# Patient Record
Sex: Male | Born: 1980 | Race: Black or African American | Hispanic: No | Marital: Single | Smoking: Never smoker
Health system: Southern US, Community
[De-identification: ages and names within clinical notes are randomized; demographics above are authoritative.]

## PROBLEM LIST (undated history)

## (undated) ENCOUNTER — Ambulatory Visit (HOSPITAL_COMMUNITY): Admission: EM | Payer: Self-pay

## (undated) DIAGNOSIS — D649 Anemia, unspecified: Secondary | ICD-10-CM

## (undated) DIAGNOSIS — F1090 Alcohol use, unspecified, uncomplicated: Secondary | ICD-10-CM

## (undated) DIAGNOSIS — K863 Pseudocyst of pancreas: Secondary | ICD-10-CM

## (undated) DIAGNOSIS — K859 Acute pancreatitis without necrosis or infection, unspecified: Secondary | ICD-10-CM

## (undated) DIAGNOSIS — A549 Gonococcal infection, unspecified: Secondary | ICD-10-CM

## (undated) DIAGNOSIS — K7010 Alcoholic hepatitis without ascites: Secondary | ICD-10-CM

## (undated) DIAGNOSIS — I10 Essential (primary) hypertension: Secondary | ICD-10-CM

## (undated) HISTORY — DX: Pseudocyst of pancreas: K86.3

## (undated) HISTORY — DX: Gonococcal infection, unspecified: A54.9

## (undated) HISTORY — DX: Alcohol use, unspecified, uncomplicated: F10.90

## (undated) HISTORY — DX: Acute pancreatitis without necrosis or infection, unspecified: K85.90

## (undated) HISTORY — DX: Essential (primary) hypertension: I10

## (undated) HISTORY — DX: Anemia, unspecified: D64.9

## (undated) HISTORY — DX: Alcoholic hepatitis without ascites: K70.10

---

## 2005-08-12 ENCOUNTER — Emergency Department (HOSPITAL_COMMUNITY): Admission: EM | Admit: 2005-08-12 | Discharge: 2005-08-12 | Payer: Self-pay | Admitting: Emergency Medicine

## 2007-11-20 ENCOUNTER — Emergency Department (HOSPITAL_COMMUNITY): Admission: EM | Admit: 2007-11-20 | Discharge: 2007-11-20 | Payer: Self-pay | Admitting: Family Medicine

## 2015-12-13 ENCOUNTER — Ambulatory Visit (HOSPITAL_COMMUNITY)
Admission: EM | Admit: 2015-12-13 | Discharge: 2015-12-13 | Disposition: A | Discharge status: Home / Self Care | Payer: Self-pay | Attending: Family Medicine | Admitting: Family Medicine

## 2015-12-13 DIAGNOSIS — R369 Urethral discharge, unspecified: Secondary | ICD-10-CM

## 2015-12-13 DIAGNOSIS — A549 Gonococcal infection, unspecified: Secondary | ICD-10-CM

## 2015-12-13 MED ORDER — AZITHROMYCIN 250 MG PO TABS
1000.0000 mg | ORAL_TABLET | Freq: Once | ORAL | Status: AC
  Administered 2015-12-13: 1000 mg via ORAL

## 2015-12-13 MED ORDER — AZITHROMYCIN 250 MG PO TABS
ORAL_TABLET | ORAL | Status: AC
  Filled 2015-12-13: qty 4

## 2015-12-13 MED ORDER — CEFTRIAXONE SODIUM 1 G IJ SOLR
INTRAMUSCULAR | Status: AC
  Filled 2015-12-13: qty 10

## 2015-12-13 MED ORDER — CEFTRIAXONE SODIUM 1 G IJ SOLR
1.0000 g | Freq: Once | INTRAMUSCULAR | Status: AC
  Administered 2015-12-13: 1 g via INTRAMUSCULAR

## 2015-12-13 NOTE — ED Triage Notes (Signed)
Pt here for treatment for STD 

## 2015-12-13 NOTE — ED Provider Notes (Signed)
[facility]    CSN: [id number] Arrival date & time: 12/13/15  1741     History   Chief Complaint No chief complaint on file.   HPI Alexander [name] is a 35 y.o. male.   The history is provided by the patient.  Male GU Problem  Presenting symptoms: penile discharge   Presenting symptoms: no penile pain   Context comment:  Told by [facility] of pos test for oral GC but no rx avail for approx 10d., here for rx. Relieved by:  None tried Worsened by:  Nothing Ineffective treatments:  None tried Associated symptoms: no fever, no penile swelling and no scrotal swelling   Risk factors: multiple sexual partners, recent sexual activity and unprotected sex     No past medical history on file.  There are no active problems to display for this patient.   No past surgical history on file.     Home Medications    Prior to Admission medications   Not on File    Family History No family history on file.  Social History Social History  Substance Use Topics  . Smoking status: Not on file  . Smokeless tobacco: Not on file  . Alcohol use Not on file     Allergies   Patient has no allergy information on record.   Review of Systems Review of Systems  Constitutional: Negative for fever.  HENT: Positive for sore throat.   Genitourinary: Positive for discharge. Negative for penile pain, penile swelling, scrotal swelling and testicular pain.  Hematological: Positive for adenopathy.  All other systems reviewed and are negative.    Physical Exam Triage Vital Signs ED Triage Vitals  Enc Vitals Group     BP      Pulse      Resp      Temp      Temp src      SpO2      Weight      Height      Head Circumference      Peak Flow      Pain Score      Pain Loc      Pain Edu?      Excl. in GC?    No data found.   Updated Vital Signs There were no vitals taken for this visit.  Visual Acuity Right Eye Distance:   Left Eye Distance:   Bilateral Distance:     Right Eye Near:   Left Eye Near:    Bilateral Near:     Physical Exam  Constitutional: He is oriented to person, place, and time. He appears well-developed and well-nourished. No distress.  HENT:  Mouth/Throat: Oropharynx is clear and moist. No oropharyngeal exudate.  Neck: Normal range of motion. Neck supple.  Lymphadenopathy:    He has cervical adenopathy.  Neurological: He is alert and oriented to person, place, and time.  Nursing note and vitals reviewed.    UC Treatments / Results  Labs (all labs ordered are listed, but only abnormal results are displayed) Labs Reviewed - No data to display  EKG  EKG Interpretation None       Radiology No results found.  Procedures Procedures (including critical care time)  Medications Ordered in UC Medications  cefTRIAXone (ROCEPHIN) injection 1 g (not administered)  azithromycin (ZITHROMAX) tablet 1,000 mg (not administered)     Initial Impression / Assessment and Plan / UC Course  I have reviewed the triage vital signs and the nursing  notes.  Pertinent labs & imaging results that were available during my care of the patient were reviewed by me and considered in my medical decision making (see chart for details).  Clinical Course       Final Clinical Impressions(s) / UC Diagnoses   Final diagnoses:  None    New Prescriptions New Prescriptions   No medications on file     Linna HoffJames D Kindl, MD 12/13/15 [phone]

## 2015-12-13 NOTE — Discharge Instructions (Signed)
Follow up at health dept if further issues.

## 2017-11-09 ENCOUNTER — Encounter (HOSPITAL_COMMUNITY): Payer: Self-pay | Admitting: Emergency Medicine

## 2017-11-09 ENCOUNTER — Ambulatory Visit (HOSPITAL_COMMUNITY)
Admission: EM | Admit: 2017-11-09 | Discharge: 2017-11-09 | Disposition: A | Discharge status: Home / Self Care | Payer: Self-pay | Attending: Family Medicine | Admitting: Family Medicine

## 2017-11-09 DIAGNOSIS — Z202 Contact with and (suspected) exposure to infections with a predominantly sexual mode of transmission: Secondary | ICD-10-CM | POA: Insufficient documentation

## 2017-11-09 DIAGNOSIS — R369 Urethral discharge, unspecified: Secondary | ICD-10-CM

## 2017-11-09 MED ORDER — CEFTRIAXONE SODIUM 250 MG IJ SOLR
INTRAMUSCULAR | Status: AC
  Filled 2017-11-09: qty 250

## 2017-11-09 MED ORDER — CEFTRIAXONE SODIUM 250 MG IJ SOLR
250.0000 mg | Freq: Once | INTRAMUSCULAR | Status: AC
  Administered 2017-11-09: 250 mg via INTRAMUSCULAR

## 2017-11-09 MED ORDER — AZITHROMYCIN 250 MG PO TABS
1000.0000 mg | ORAL_TABLET | Freq: Once | ORAL | Status: AC
  Administered 2017-11-09: 1000 mg via ORAL

## 2017-11-09 MED ORDER — AZITHROMYCIN 250 MG PO TABS
ORAL_TABLET | ORAL | Status: AC
  Filled 2017-11-09: qty 4

## 2017-11-09 NOTE — ED Triage Notes (Signed)
Pt sts thinks he has gonorrhea

## 2017-11-09 NOTE — ED Provider Notes (Signed)
[facility]    CSN: [id number] Arrival date & time: 11/09/17  1420     History   Chief Complaint Chief Complaint  Patient presents with  . Exposure to STD    HPI LEORY ALLINSON is a 37 y.o. male.   37 year old male comes in for 1 to 2-day history of penile discharge.  States slight irritation to the tip of the penis.  Denies urinary symptoms such as frequency, dysuria, hematuria.  Denies fever, chills, night sweats.  Denies abdominal pain, nausea, vomiting.  Denies testicular swelling, testicular pain, penile lesion/ulcer.  He is sexually active active with multiple male partners, consistent condom use.  He is on Prep, and gets regular HIV/RPR testing.      History reviewed. No pertinent past medical history.  There are no active problems to display for this patient.   History reviewed. No pertinent surgical history.     Home Medications    Prior to Admission medications   Not on File    Family History History reviewed. No pertinent family history.  Social History Social History   Tobacco Use  . Smoking status: Never Smoker  . Smokeless tobacco: Never Used  Substance Use Topics  . Alcohol use: Not on file  . Drug use: Not on file     Allergies   Patient has no known allergies.   Review of Systems Review of Systems  Reason unable to perform ROS: See HPI as above.     Physical Exam Triage Vital Signs ED Triage Vitals [11/09/17 1452]  Enc Vitals Group     BP 137/84     Pulse Rate 83     Resp 18     Temp 98.1 F (36.7 C)     Temp Source Oral     SpO2 100 %     Weight      Height      Head Circumference      Peak Flow      Pain Score 0     Pain Loc      Pain Edu?      Excl. in GC?    No data found.  Updated Vital Signs BP 137/84 (BP Location: Left Arm)   Pulse 83   Temp 98.1 F (36.7 C) (Oral)   Resp 18   SpO2 100%   Physical Exam  Constitutional: He is oriented to person, place, and time. He appears well-developed  and well-nourished. No distress.  HENT:  Head: Normocephalic and atraumatic.  Eyes: Pupils are equal, round, and reactive to light. Conjunctivae are normal.  Neurological: He is alert and oriented to person, place, and time.  Skin: He is not diaphoretic.   UC Treatments / Results  Labs (all labs ordered are listed, but only abnormal results are displayed) Labs Reviewed  URINE CYTOLOGY ANCILLARY ONLY    EKG None  Radiology No results found.  Procedures Procedures (including critical care time)  Medications Ordered in UC Medications  azithromycin (ZITHROMAX) tablet 1,000 mg (1,000 mg Oral Given 11/09/17 1529)  cefTRIAXone (ROCEPHIN) injection 250 mg (250 mg Intramuscular Given 11/09/17 1529)    Initial Impression / Assessment and Plan / UC Course  I have reviewed the triage vital signs and the nursing notes.  Pertinent labs & imaging results that were available during my care of the patient were reviewed by me and considered in my medical decision making (see chart for details).    Patient was treated empirically for GC. Azithromycin and  Rocephin given in office today. Cytology sent, patient will be contacted with any positive results that require additional treatment. Patient to refrain from sexual activity for the next 7 days. Return precautions given.   Final Clinical Impressions(s) / UC Diagnoses   Final diagnoses:  Penile discharge    ED Prescriptions    None        Belinda Fisher, PA-C 11/09/17 1534

## 2017-11-09 NOTE — Discharge Instructions (Signed)
You were treated empirically for gonorrhea, chlamydia. Azithromycin 1g by mouth and Rocephin 250mg  injection given in office today. Cytology sent, you will be contacted with any positive results that requires further treatment. Refrain from sexual activity and alcohol use for the next 7 days. Monitor for any worsening of symptoms, fever, testicular swelling/pain, penile lesion/ulcer.

## 2017-11-10 LAB — URINE CYTOLOGY ANCILLARY ONLY
CHLAMYDIA, DNA PROBE: NEGATIVE
Neisseria Gonorrhea: POSITIVE — AB
Trichomonas: NEGATIVE

## 2017-11-12 ENCOUNTER — Telehealth (HOSPITAL_COMMUNITY): Payer: Self-pay

## 2017-11-12 NOTE — Telephone Encounter (Signed)
Test for gonorrhea was positive. This was treated at the urgent care visit with IM rocephin 250mg and po zithromax 1g. Pt called regarding test results, instructed patient to refrain from sexual intercourse for 7 days after treatment to give the medicine time to work. Sexual partners need to be notified and tested/treated. Condoms may reduce risk of reinfection. Recheck or followup with PCP for further evaluation if symptoms are not improving. Answered all patient questions. [facility] notified.  

## 2019-10-07 ENCOUNTER — Other Ambulatory Visit: Payer: Self-pay

## 2019-10-07 ENCOUNTER — Ambulatory Visit (HOSPITAL_COMMUNITY)
Admission: EM | Admit: 2019-10-07 | Discharge: 2019-10-07 | Disposition: A | Discharge status: Home / Self Care | Payer: Self-pay | Attending: Nurse Practitioner | Admitting: Nurse Practitioner

## 2019-10-07 ENCOUNTER — Encounter (HOSPITAL_COMMUNITY): Payer: Self-pay

## 2019-10-07 DIAGNOSIS — Z202 Contact with and (suspected) exposure to infections with a predominantly sexual mode of transmission: Secondary | ICD-10-CM | POA: Insufficient documentation

## 2019-10-07 DIAGNOSIS — R369 Urethral discharge, unspecified: Secondary | ICD-10-CM | POA: Insufficient documentation

## 2019-10-07 MED ORDER — CEFTRIAXONE SODIUM 500 MG IJ SOLR
500.0000 mg | Freq: Once | INTRAMUSCULAR | Status: AC
  Administered 2019-10-07: 500 mg via INTRAMUSCULAR

## 2019-10-07 MED ORDER — CEFTRIAXONE SODIUM 500 MG IJ SOLR
INTRAMUSCULAR | Status: AC
  Filled 2019-10-07: qty 500

## 2019-10-07 MED ORDER — DOXYCYCLINE HYCLATE 100 MG PO CAPS: 100.0000 mg | ORAL_CAPSULE | Freq: Two times a day (BID) | ORAL | 0 refills | Status: AC

## 2019-10-07 MED ORDER — LIDOCAINE HCL (PF) 1 % IJ SOLN
INTRAMUSCULAR | Status: AC
  Filled 2019-10-07: qty 2

## 2019-10-07 NOTE — ED Provider Notes (Signed)
[facility]    CSN: [id number] Arrival date & time: 10/07/19  1819      History   Chief Complaint Chief Complaint  Patient presents with   SEXUALLY TRANSMITTED DISEASE    HPI Alexander [name] is a 39 y.o. male.   Subjective:  Alexander [name] is a 39 y.o. male who complains of penile discharge for 2 days. Patient denies back pain, fever, dysuria or rectal pain/sores.  Patient has history of gonorrhea in the past and feels that this is the same. He practices homosexual sexual activity through the penis and rectum. He denies any anal sores or discomfort. His last HIV test was over 6 months ago.   The following portions of the patient's history were reviewed and updated as appropriate: allergies, current medications, past family history, past medical history, past social history, past surgical history and problem list.       History reviewed. No pertinent past medical history.  There are no problems to display for this patient.   History reviewed. No pertinent surgical history.     Home Medications    Prior to Admission medications   Medication Sig Start Date End Date Taking? Authorizing Provider  doxycycline (VIBRAMYCIN) 100 MG capsule Take 1 capsule (100 mg total) by mouth 2 (two) times daily for 7 days. 10/07/19 10/14/19  Lurline Idol, FNP    Family History Family History  Problem Relation Age of Onset   Healthy Mother    Hypertension Father     Social History Social History   Tobacco Use   Smoking status: Never Smoker   Smokeless tobacco: Never Used  Substance Use Topics   Alcohol use: Not on file   Drug use: Not on file     Allergies   Patient has no known allergies.   Review of Systems Review of Systems  Constitutional: Negative.   Gastrointestinal: Negative for anal bleeding.  Genitourinary: Positive for discharge. Negative for dysuria, genital sores and penile pain.  All other systems reviewed and are  negative.    Physical Exam Triage Vital Signs ED Triage Vitals  Enc Vitals Group     BP 10/07/19 2018 (!) 142/100     Pulse Rate 10/07/19 2018 89     Resp 10/07/19 2018 19     Temp 10/07/19 2018 98.2 F (36.8 C)     Temp src --      SpO2 10/07/19 2018 99 %     Weight --      Height --      Head Circumference --      Peak Flow --      Pain Score 10/07/19 2017 0     Pain Loc --      Pain Edu? --      Excl. in GC? --    No data found.  Updated Vital Signs BP (!) 142/100    Pulse 89    Temp 98.2 F (36.8 C)    Resp 19    SpO2 99%   Visual Acuity Right Eye Distance:   Left Eye Distance:   Bilateral Distance:    Right Eye Near:   Left Eye Near:    Bilateral Near:     Physical Exam Vitals reviewed. Exam conducted with a chaperone present.  Constitutional:      Appearance: Normal appearance.  HENT:     Head: Normocephalic.  Genitourinary:    Penis: Normal and circumcised. No tenderness, discharge or swelling.  Testes: Normal.     Rectum: Normal.  Musculoskeletal:        General: Normal range of motion.     Cervical back: Normal range of motion and neck supple.  Skin:    General: Skin is warm and dry.  Neurological:     General: No focal deficit present.     Mental Status: He is alert and oriented to person, place, and time.      UC Treatments / Results  Labs (all labs ordered are listed, but only abnormal results are displayed) Labs Reviewed  HIV ANTIBODY (ROUTINE TESTING W REFLEX)  CYTOLOGY, (ORAL, ANAL, URETHRAL) ANCILLARY ONLY    EKG   Radiology No results found.  Procedures Procedures (including critical care time)  Medications Ordered in UC Medications  cefTRIAXone (ROCEPHIN) injection 500 mg (has no administration in time range)    Initial Impression / Assessment and Plan / UC Course  I have reviewed the triage vital signs and the nursing notes.  Pertinent labs & imaging results that were available during my care of the patient  were reviewed by me and considered in my medical decision making (see chart for details).    39 yo male presenting for STD treatment for possible gonorrhea. Penile swabs pending for GC/lamydia as well as HIV screen. Rocephin given in clinic. Rx for Doxy provided. Safe sex practices highly encouraged.   Today's evaluation has revealed no signs of a dangerous process. Discussed diagnosis with patient and/or guardian. Patient and/or guardian aware of their diagnosis, possible red flag symptoms to watch out for and need for close follow up. Patient and/or guardian understands verbal and written discharge instructions. Patient and/or guardian comfortable with plan and disposition.  Patient and/or guardian has a clear mental status at this time, good insight into illness (after discussion and teaching) and has clear judgment to make decisions regarding their care  This care was provided during an unprecedented National Emergency due to the Novel Coronavirus (COVID-19) pandemic. COVID-19 infections and transmission risks place heavy strains on healthcare resources.  As this pandemic evolves, our facility, providers, and staff strive to respond fluidly, to remain operational, and to provide care relative to available resources and information. Outcomes are unpredictable and treatments are without well-defined guidelines. Further, the impact of COVID-19 on all aspects of urgent care, including the impact to patients seeking care for reasons other than COVID-19, is unavoidable during this national emergency. At this time of the global pandemic, management of patients has significantly changed, even for non-COVID positive patients given high local and regional COVID volumes at this time requiring high healthcare system and resource utilization. The standard of care for management of both COVID suspected and non-COVID suspected patients continues to change rapidly at the local, regional, national, and global levels. This  patient was worked up and treated to the best available but ever changing evidence and resources available at this current time.   Documentation was completed with the aid of voice recognition software. Transcription may contain typographical errors.   Final Clinical Impressions(s) / UC Diagnoses   Final diagnoses:  STD exposure  Penile discharge   Discharge Instructions   None    ED Prescriptions    Medication Sig Dispense Auth. Provider   doxycycline (VIBRAMYCIN) 100 MG capsule Take 1 capsule (100 mg total) by mouth 2 (two) times daily for 7 days. 14 capsule Lurline Idol, FNP     PDMP not reviewed this encounter.   Lurline Idol, [place] 10/07/19 2140

## 2019-10-07 NOTE — ED Triage Notes (Signed)
Pt presents with concern for gonorrhea. Reports discharge x 2 days. Possible exposure.

## 2019-10-08 LAB — CYTOLOGY, (ORAL, ANAL, URETHRAL) ANCILLARY ONLY
Chlamydia: NEGATIVE
Comment: NEGATIVE
Comment: NORMAL
Neisseria Gonorrhea: NEGATIVE

## 2019-10-08 LAB — HIV ANTIBODY (ROUTINE TESTING W REFLEX): HIV Screen 4th Generation wRfx: NONREACTIVE

## 2021-02-28 ENCOUNTER — Emergency Department (HOSPITAL_COMMUNITY): Payer: Self-pay

## 2021-02-28 ENCOUNTER — Emergency Department (HOSPITAL_COMMUNITY)
Admission: EM | Admit: 2021-02-28 | Discharge: 2021-02-28 | Disposition: A | Discharge status: Home / Self Care | Payer: Self-pay | Attending: Emergency Medicine | Admitting: Emergency Medicine

## 2021-02-28 ENCOUNTER — Other Ambulatory Visit: Payer: Self-pay

## 2021-02-28 DIAGNOSIS — R519 Headache, unspecified: Secondary | ICD-10-CM

## 2021-02-28 DIAGNOSIS — I10 Essential (primary) hypertension: Secondary | ICD-10-CM | POA: Insufficient documentation

## 2021-02-28 DIAGNOSIS — Z79899 Other long term (current) drug therapy: Secondary | ICD-10-CM | POA: Insufficient documentation

## 2021-02-28 LAB — TROPONIN I (HIGH SENSITIVITY)
Troponin I (High Sensitivity): 3 ng/L (ref <18)
Troponin I (High Sensitivity): 5 ng/L (ref <18)

## 2021-02-28 LAB — CBC
HCT: 39.5 % (ref 39.0–52.0)
Hemoglobin: 14 g/dL (ref 13.0–17.0)
MCH: 34 pg (ref 26.0–34.0)
MCHC: 35.4 g/dL (ref 30.0–36.0)
MCV: 95.9 fL (ref 80.0–100.0)
Platelets: 242 10*3/uL (ref 150–400)
RBC: 4.12 MIL/uL — ABNORMAL LOW (ref 4.22–5.81)
RDW: 11.9 % (ref 11.5–15.5)
WBC: 5.7 10*3/uL (ref 4.0–10.5)
nRBC: 0 % (ref 0.0–0.2)

## 2021-02-28 LAB — BASIC METABOLIC PANEL
Anion gap: 18 — ABNORMAL HIGH (ref 5–15)
BUN: 5 mg/dL — ABNORMAL LOW (ref 6–20)
CO2: 23 mmol/L (ref 22–32)
Calcium: 9.4 mg/dL (ref 8.9–10.3)
Chloride: 94 mmol/L — ABNORMAL LOW (ref 98–111)
Creatinine, Ser: 0.85 mg/dL (ref 0.61–1.24)
GFR, Estimated: >60 mL/min (ref >60)
Glucose, Bld: 77 mg/dL (ref 70–99)
Potassium: 3.2 mmol/L — ABNORMAL LOW (ref 3.5–5.1)
Sodium: 135 mmol/L (ref 135–145)

## 2021-02-28 MED ORDER — HYDROXYZINE HCL 25 MG PO TABS: 25.0000 mg | ORAL_TABLET | Freq: Three times a day (TID) | ORAL | 0 refills | Status: DC | PRN

## 2021-02-28 MED ORDER — PROCHLORPERAZINE EDISYLATE 10 MG/2ML IJ SOLN
5.0000 mg | Freq: Once | INTRAMUSCULAR | Status: AC
  Administered 2021-02-28: 5 mg via INTRAMUSCULAR
  Filled 2021-02-28: qty 2

## 2021-02-28 MED ORDER — KETOROLAC TROMETHAMINE 30 MG/ML IJ SOLN
30.0000 mg | Freq: Once | INTRAMUSCULAR | Status: AC
  Administered 2021-02-28: 30 mg via INTRAMUSCULAR
  Filled 2021-02-28: qty 1

## 2021-02-28 MED ORDER — AMLODIPINE BESYLATE 5 MG PO TABS: 5.0000 mg | ORAL_TABLET | Freq: Every day | ORAL | 0 refills | Status: DC

## 2021-02-28 NOTE — ED Provider Notes (Signed)
[facility] EMERGENCY DEPARTMENT Provider Note   CSN: [id number] Arrival date & time: 02/28/21  1627     History  Chief Complaint  Patient presents with   Chest Pain   Headache   Hypertension    SOMNANG MAHAN is a 41 y.o. male.   Chest Pain Associated symptoms: headache   Associated symptoms: no abdominal pain, no back pain and no fatigue   Headache Associated symptoms: no abdominal pain, no back pain, no congestion and no fatigue   Hypertension Associated symptoms include chest pain and headaches. Pertinent negatives include no abdominal pain.  Patient presents with chest pain headache and hypertension.  Began yesterday.  States went to be seen for medicine check and found to have hypertension.  Reportedly 170/110.  States that 3 months ago when he was last checked his blood was elevated but not as much.  States he has a headache that feels as if his head is getting split apart.  No nausea or vomiting.  Does have slight chest tightness.  No swelling his legs.  Does drink heavily but does not use other drugs.  No history of hypertension.  No swelling in his legs.  No stimulants.     Home Medications Prior to Admission medications   Medication Sig Start Date End Date Taking? Authorizing Provider  acetaminophen (TYLENOL) 500 MG tablet Take 500 mg by mouth every 6 (six) hours as needed for moderate pain or headache.   Yes [provider]  amLODipine (NORVASC) 5 MG tablet Take 1 tablet (5 mg total) by mouth daily. 02/28/21  Yes Benjiman Core, MD  DESCOVY 200-25 MG tablet Take 1 tablet by mouth daily. 02/22/21  Yes [provider]  hydrOXYzine (ATARAX) 25 MG tablet Take 1 tablet (25 mg total) by mouth every 8 (eight) hours as needed for anxiety. 02/28/21  Yes Benjiman Core, MD  ibuprofen (ADVIL) 200 MG tablet Take 400 mg by mouth every 6 (six) hours as needed for moderate pain or headache.   Yes [provider]      Allergies     Patient has no known allergies.    Review of Systems   Review of Systems  Constitutional:  Negative for fatigue.  HENT:  Negative for congestion.   Cardiovascular:  Positive for chest pain.  Gastrointestinal:  Negative for abdominal pain.  Genitourinary:  Negative for flank pain.  Musculoskeletal:  Negative for back pain.  Neurological:  Positive for headaches.   Physical Exam Updated Vital Signs BP (!) 144/96    Pulse 71    Temp 98.6 F (37 C) (Oral)    Resp 20    Ht 5\' 5"  (1.651 m)    Wt 63.5 kg    SpO2 98%    BMI 23.30 kg/m  Physical Exam Vitals and nursing note reviewed.  Cardiovascular:     Rate and Rhythm: Normal rate and regular rhythm.  Pulmonary:     Breath sounds: No decreased breath sounds or wheezing.  Chest:     Chest wall: No tenderness.  Abdominal:     Tenderness: There is no abdominal tenderness.  Musculoskeletal:     Cervical back: Neck supple.     Right lower leg: No edema.     Left lower leg: No edema.  Skin:    Capillary Refill: Capillary refill takes less than 2 seconds.  Neurological:     Mental Status: He is alert and oriented to person, place, and time.    ED  Results / Procedures / Treatments   Labs (all labs ordered are listed, but only abnormal results are displayed) Labs Reviewed  BASIC METABOLIC PANEL - Abnormal; Notable for the following components:      Result Value   Potassium 3.2 (*)    Chloride 94 (*)    BUN 5 (*)    Anion gap 18 (*)    All other components within normal limits  CBC - Abnormal; Notable for the following components:   RBC 4.12 (*)    All other components within normal limits  TROPONIN I (HIGH SENSITIVITY)  TROPONIN I (HIGH SENSITIVITY)    EKG None  Radiology DG Chest 2 View  Result Date: 02/28/2021 CLINICAL DATA:  Headache with chest pain. EXAM: CHEST - 2 VIEW COMPARISON:  None. FINDINGS: The heart size and mediastinal contours are within normal limits. Both lungs are clear. The visualized skeletal  structures are unremarkable. IMPRESSION: No active cardiopulmonary disease. Electronically Signed   By: Darliss CheneyAmy  Guttmann M.D.   On: 02/28/2021 19:16   CT HEAD WO CONTRAST (5MM)  Result Date: 02/28/2021 CLINICAL DATA:  Headache EXAM: CT HEAD WITHOUT CONTRAST TECHNIQUE: Contiguous axial images were obtained from the base of the skull through the vertex without intravenous contrast. RADIATION DOSE REDUCTION: This exam was performed according to the departmental dose-optimization program which includes automated exposure control, adjustment of the mA and/or kV according to patient size and/or use of iterative reconstruction technique. COMPARISON:  None. FINDINGS: Brain: No acute intracranial hemorrhage, mass effect, or herniation. No extra-axial fluid collections. No evidence of acute territorial infarct. No hydrocephalus. Vascular: No hyperdense vessel or unexpected calcification. Skull: Normal. Negative for fracture or focal lesion. Sinuses/Orbits: No acute finding. Other: None. IMPRESSION: No acute intracranial process identified. Electronically Signed   By: Jannifer Hickelaney  Williams M.D.   On: 02/28/2021 20:13    Procedures Procedures    Medications Ordered in ED Medications  ketorolac (TORADOL) 30 MG/ML injection 30 mg (30 mg Intramuscular Given 02/28/21 2311)  prochlorperazine (COMPAZINE) injection 5 mg (5 mg Intramuscular Given 02/28/21 2309)    ED Course/ Medical Decision Making/ A&P                           Medical Decision Making Amount and/or Complexity of Data Reviewed Labs: ordered. Radiology: ordered and independent interpretation performed. Decision-making details documented in ED Course. ECG/medicine tests: independent interpretation performed. Decision-making details documented in ED Course.  Risk Prescription drug management.  Initial differentialwith hypertension and high blood pressure includes intracranial hemorrhage, hypertensive headache, hypertensive urgency. Patient presents with  headache hypertension and chest pain.  Has had for last couple days.  States blood pressure was elevated checked 3 months ago 2.  No history of hypertension.  Is on preexposure prophylaxis for HIV.  Head CT done and reassuring.  Independently interpreted by me.  EKG also reassuring without ischemia.  Blood work does not show renal failure and troponin not elevated.  Will start patient on amlodipine.  Will have follow-up with a PCP.  Since had been elevated likely a few months ago is likely an ongoing hypertension.  Also states he is having some anxiety.  We will add a little Vistaril.  Does drink heavily and instructed this is probably not helping with anxiety.  Will discharge home.        Final Clinical Impression(s) / ED Diagnoses Final diagnoses:  Hypertension, unspecified type  Nonintractable headache, unspecified chronicity pattern, unspecified headache type  Rx / DC Orders ED Discharge Orders          Ordered    amLODipine (NORVASC) 5 MG tablet  Daily        02/28/21 2309    hydrOXYzine (ATARAX) 25 MG tablet  Every 8 hours PRN        02/28/21 2317              Benjiman Core, MD 02/28/21 2349

## 2021-02-28 NOTE — ED Triage Notes (Signed)
Pt here POV with c/o of headache, chest pain and hypertension. Pt seen at health clinic 02/27/2021. Pt reports BP 170's. Since yesterday, pt reports splitting headache of 7/10. Chest tightness 4/10. Pt feels VS are better in triage and may leave. RN encouraged pt to stay.

## 2021-02-28 NOTE — ED Provider Triage Note (Signed)
Emergency Medicine Provider Triage Evaluation Note  Alexander [name] , a 41 y.o. male  was evaluated in triage.  Pt complains of severe, left-sided headache starting yesterday.  Patient's blood pressures were noted to be high, into the 170s.  Patient does not have a previous history of high blood pressure and is not under treatment for this.  He reports some blurry vision in his left eye.  Also some chest tightness that is persisting.  No shortness of breath.  No lower extremity swelling.  No neck pain.  No weakness, numbness, or tingling in extremities.  He has not had a similar headache in the past.  Review of Systems  Positive: Headache, chest tightness Negative: Vomiting  Physical Exam  BP (!) 159/106 (BP Location: Right Arm)    Pulse 75    Temp 98.6 F (37 C) (Oral)    Resp 20    Ht 5\' 5"  (1.651 m)    Wt 63.5 kg    SpO2 100%    BMI 23.30 kg/m  Gen:   Awake, no distress   Resp:  Normal effort  MSK:   Moves extremities without difficulty  Other:  Heart regular rate and rhythm, lungs clear to auscultation, gross neuro intact  Medical Decision Making  Medically screening exam initiated at 6:18 PM.  Appropriate orders placed.  Alexander [name] was informed that the remainder of the evaluation will be completed by another provider, this initial triage assessment does not replace that evaluation, and the importance of remaining in the ED until their evaluation is complete.     Alexander Gaudier, PA-C 02/28/21 1820

## 2021-02-28 NOTE — Discharge Instructions (Signed)
Follow-up with a primary care doctor for further management of the blood pressure.

## 2022-02-07 ENCOUNTER — Ambulatory Visit (HOSPITAL_COMMUNITY)
Admission: EM | Admit: 2022-02-07 | Discharge: 2022-02-07 | Disposition: A | Discharge status: Home / Self Care | Payer: Self-pay | Attending: Emergency Medicine | Admitting: Emergency Medicine

## 2022-02-07 ENCOUNTER — Encounter (HOSPITAL_COMMUNITY): Payer: Self-pay

## 2022-02-07 DIAGNOSIS — M545 Low back pain, unspecified: Secondary | ICD-10-CM

## 2022-02-07 MED ORDER — IBUPROFEN 600 MG PO TABS: 600.0000 mg | ORAL_TABLET | Freq: Three times a day (TID) | ORAL | 0 refills | Status: DC

## 2022-02-07 MED ORDER — DEXAMETHASONE SODIUM PHOSPHATE 10 MG/ML IJ SOLN
10.0000 mg | Freq: Once | INTRAMUSCULAR | Status: AC
  Administered 2022-02-07: 10 mg via INTRAMUSCULAR

## 2022-02-07 MED ORDER — CYCLOBENZAPRINE HCL 10 MG PO TABS: 10.0000 mg | ORAL_TABLET | Freq: Two times a day (BID) | ORAL | 0 refills | Status: DC | PRN

## 2022-02-07 MED ORDER — ACETAMINOPHEN 500 MG PO TABS: 500.0000 mg | ORAL_TABLET | Freq: Four times a day (QID) | ORAL | 0 refills | Status: AC | PRN

## 2022-02-07 MED ORDER — DEXAMETHASONE SODIUM PHOSPHATE 10 MG/ML IJ SOLN
INTRAMUSCULAR | Status: AC
  Filled 2022-02-07: qty 1

## 2022-02-07 MED ORDER — IBUPROFEN 600 MG PO TABS: 600.0000 mg | ORAL_TABLET | Freq: Two times a day (BID) | ORAL | 0 refills | Status: AC | PRN

## 2022-02-07 NOTE — Discharge Instructions (Addendum)
I recommend to take ibuprofen, 3 times daily for the next 5 days. You can alternate this with tylenol.  Additionally you can take the muscle relaxer twice daily.  If this makes you drowsy, take only at night.  You can apply topical lidocaine patch for 12 hours at time. I recommend the "[facility]" brand. They are less than 10 dollars at [place].  Please follow-up with your new primary care provider at your appointment next week regarding your blood pressure.

## 2022-02-07 NOTE — ED Triage Notes (Signed)
Pt reports lower back pain for several weeks. Pt reports it feels like knots are in his back.

## 2022-02-07 NOTE — ED Notes (Signed)
No answer from lobby  

## 2022-02-07 NOTE — ED Provider Notes (Signed)
[place]    CSN: [id number] Arrival date & time: 02/07/22  4259     History   Chief Complaint Chief Complaint  Patient presents with   Back Pain    HPI Alexander [name] is a 42 y.o. male. Presents with 6 day history of lower back pain Reports 10/10 today. Feels "knots" in the back Sometimes feels it radiate into the legs. No weakness. Denies numbness or tingling. No injury or trauma. Denies heavy lifting. No bowel or bladder dysfunction. No fevers. Reports history of stiff muscles  Has tried 200 mg ibuprofen. Also tried "naproxen and aleve" Last dose was today about 5 hours ago Also tried topical Voltaren   Takes descovy but unknown who prescribes this  BP elevated today. No official dx but has been elevated in the past. Has not followed up with PCP - does not have one  History reviewed. No pertinent past medical history.  There are no problems to display for this patient.  History reviewed. No pertinent surgical history.   Home Medications    Prior to Admission medications   Medication Sig Start Date End Date Taking? Authorizing Provider  acetaminophen (TYLENOL) 500 MG tablet Take 1 tablet (500 mg total) by mouth every 6 (six) hours as needed. 02/07/22  Yes Mical Kicklighter, Wells Guiles, PA-C  cyclobenzaprine (FLEXERIL) 10 MG tablet Take 1 tablet (10 mg total) by mouth 2 (two) times daily as needed for muscle spasms. 02/07/22  Yes Chalene Treu, Wells Guiles, PA-C  amLODipine (NORVASC) 5 MG tablet Take 1 tablet (5 mg total) by mouth daily. 02/28/21   Davonna Belling, MD  DESCOVY 200-25 MG tablet Take 1 tablet by mouth daily. 02/22/21   [provider]  hydrOXYzine (ATARAX) 25 MG tablet Take 1 tablet (25 mg total) by mouth every 8 (eight) hours as needed for anxiety. 02/28/21   Davonna Belling, MD  ibuprofen (ADVIL) 600 MG tablet Take 1 tablet (600 mg total) by mouth 2 (two) times daily as needed for up to 5 days. 02/07/22 02/12/22  Annica Marinello, Wells Guiles PA-C    Family  History Family History  Problem Relation Age of Onset   Healthy Mother    Hypertension Father     Social History Social History   Tobacco Use   Smoking status: Never   Smokeless tobacco: Never     Allergies   Patient has no known allergies.   Review of Systems Review of Systems  Musculoskeletal:  Positive for back pain.   As per HPI  Physical Exam Triage Vital Signs ED Triage Vitals [02/07/22 1750]  Enc Vitals Group     BP (!) 181/121     Pulse Rate (!) 103     Resp 16     Temp 98.7 F (37.1 C)     Temp Source Oral     SpO2 100 %     Weight      Height      Head Circumference      Peak Flow      Pain Score 10     Pain Loc      Pain Edu?      Excl. in [place]?    No data found.  Updated Vital Signs BP (!) 136/90 (BP Location: Left Arm)   Pulse 90   Temp 98.7 F (37.1 C) (Oral)   Resp 15   SpO2 98%    Physical Exam Vitals and nursing note reviewed.  Constitutional:      General: He is not in  acute distress. HENT:     Mouth/Throat:     Mouth: Mucous membranes are moist.     Pharynx: Oropharynx is clear.  Eyes:     Extraocular Movements: Extraocular movements intact.     Conjunctiva/sclera: Conjunctivae normal.     Pupils: Pupils are equal, round, and reactive to light.  Cardiovascular:     Rate and Rhythm: Normal rate and regular rhythm.     Heart sounds: Normal heart sounds.  Pulmonary:     Effort: Pulmonary effort is normal.     Breath sounds: Normal breath sounds.  Musculoskeletal:        General: Tenderness present. Normal range of motion.     Cervical back: Normal range of motion. No rigidity.     Comments: Bilat lumbar paraspinals tender, feel tight/spasm. No spinal or bony tenderness  Skin:    General: Skin is warm and dry.     Findings: No bruising or rash.  Neurological:     General: No focal deficit present.     Mental Status: He is alert and oriented to person, place, and time.     Cranial Nerves: Cranial nerves 2-12 are intact.      Sensory: Sensation is intact.     Motor: Motor function is intact. No weakness.     Coordination: Coordination is intact.     Gait: Gait is intact.     Deep Tendon Reflexes: Reflexes are normal and symmetric.     Comments: Strength 5/5 throughout. Sensation and ROM intact.      UC Treatments / Results  Labs (all labs ordered are listed, but only abnormal results are displayed) Labs Reviewed - No data to display  EKG   Radiology No results found.  Procedures Procedures (including critical care time)  Medications Ordered in UC Medications  dexamethasone (DECADRON) injection 10 mg (10 mg Intramuscular Given 02/07/22 1837)    Initial Impression / Assessment and Plan / UC Course  I have reviewed the triage vital signs and the nursing notes.  Pertinent labs & imaging results that were available during my care of the patient were reviewed by me and considered in my medical decision making (see chart for details).  Muscular pain. No red flags. No indication for xray imaging today.  Patient has taken naproxen today, cannot offer Toradol at this time. Will try IM decadron. On reassessment, he is improved.   Discussed that ibuprofen, Aleve, naproxen are all the same medication.  I recommend he pick one and stick with that. I have sent ibuprofen to use for 5 days, recommend to alternate with tylenol since he takes descovy.  Patient is very worried that this will affect his liver.  We discussed that these medicines do not have impact on the liver, and short course over the next 6 to 7 days will not be harmful to his kidneys. Especially if he alternates with tylenol. Recommend flexaril BID. Patient is concerned because he drinks alcohol daily. I recommend to not use together. Discussed side effects. Topical lidocaine patches OTC.  Discussed return precautions.  Set up with PCP via open scheduling, appointment is next week. He needs close follow up for blood pressure  management. BP recheck is improved. No chest pain, headache, shortness of breath, vision changes.   Final Clinical Impressions(s) / UC Diagnoses   Final diagnoses:  Acute bilateral low back pain, unspecified whether sciatica present     Discharge Instructions      I recommend to take ibuprofen, 3 times  daily for the next 5 days. You can alternate this with tylenol.  Additionally you can take the muscle relaxer twice daily.  If this makes you drowsy, take only at night.  You can apply topical lidocaine patch for 12 hours at time. I recommend the "[facility]" brand. They are less than 10 dollars at [place].  Please follow-up with your new primary care provider at your appointment next week regarding your blood pressure.     ED Prescriptions     Medication Sig Dispense Auth. Provider         cyclobenzaprine (FLEXERIL) 10 MG tablet Take 1 tablet (10 mg total) by mouth 2 (two) times daily as needed for muscle spasms. 20 tablet Faysal Fenoglio, PA-C         acetaminophen (TYLENOL) 500 MG tablet Take 1 tablet (500 mg total) by mouth every 6 (six) hours as needed. 30 tablet Lynnelle Mesmer, PA-C   ibuprofen (ADVIL) 600 MG tablet Take 1 tablet (600 mg total) by mouth 2 (two) times daily as needed for up to 5 days. 10 tablet Parisa Pinela, Wells Guiles, PA-C      PDMP not reviewed this encounter.   Kyra Leyland 02/07/22 1928

## 2022-02-14 ENCOUNTER — Ambulatory Visit: Payer: Self-pay | Admitting: Family

## 2022-06-26 ENCOUNTER — Ambulatory Visit (HOSPITAL_COMMUNITY)
Admission: EM | Admit: 2022-06-26 | Discharge: 2022-06-26 | Disposition: A | Discharge status: Home / Self Care | Payer: Self-pay | Attending: Emergency Medicine | Admitting: Emergency Medicine

## 2022-06-26 ENCOUNTER — Encounter (HOSPITAL_COMMUNITY): Payer: Self-pay | Admitting: Emergency Medicine

## 2022-06-26 DIAGNOSIS — M25562 Pain in left knee: Secondary | ICD-10-CM

## 2022-06-26 DIAGNOSIS — M25561 Pain in right knee: Secondary | ICD-10-CM

## 2022-06-26 MED ORDER — DEXAMETHASONE SODIUM PHOSPHATE 10 MG/ML IJ SOLN
10.0000 mg | Freq: Once | INTRAMUSCULAR | Status: AC
  Administered 2022-06-26: 10 mg via INTRAMUSCULAR

## 2022-06-26 MED ORDER — DEXAMETHASONE SODIUM PHOSPHATE 10 MG/ML IJ SOLN
INTRAMUSCULAR | Status: AC
  Filled 2022-06-26: qty 1

## 2022-06-26 NOTE — ED Provider Notes (Signed)
[facility]    CSN: [id number] Arrival date & time: 06/26/22  1809      History   Chief Complaint Chief Complaint  Patient presents with   Knee Pain    HPI Alexander [name] is a 42 y.o. male.   Patient presents to clinic for ongoing bilateral knee pain.  He was seen at the [facility] clinic last week for the same problem.  He is presenting today requesting joint injections.  He has tried oral steroids, partially, he quit these due to the side effects.  Pain has been ongoing for the past few months, denies any injuries, falls or trauma.  Patient ambulating in room, pacing.  The history is provided by the patient and medical records.  Knee Pain   History reviewed. No pertinent past medical history.  There are no problems to display for this patient.   History reviewed. No pertinent surgical history.     Home Medications    Prior to Admission medications   Medication Sig Start Date End Date Taking? Authorizing Provider  acetaminophen (TYLENOL) 500 MG tablet Take 1 tablet (500 mg total) by mouth every 6 (six) hours as needed. 02/07/22   Rising, Lurena Joiner, PA-C  amLODipine (NORVASC) 5 MG tablet Take 1 tablet (5 mg total) by mouth daily. 02/28/21   Benjiman Core, MD  cyclobenzaprine (FLEXERIL) 10 MG tablet Take 1 tablet (10 mg total) by mouth 2 (two) times daily as needed for muscle spasms. 02/07/22   Rising, Rebecca, PA-C  DESCOVY 200-25 MG tablet Take 1 tablet by mouth daily. 02/22/21   [provider]  hydrOXYzine (ATARAX) 25 MG tablet Take 1 tablet (25 mg total) by mouth every 8 (eight) hours as needed for anxiety. 02/28/21   Benjiman Core, MD    Family History Family History  Problem Relation Age of Onset   Healthy Mother    Hypertension Father     Social History Social History   Tobacco Use   Smoking status: Never   Smokeless tobacco: Never     Allergies   Patient has no known allergies.   Review of Systems Review of Systems   Musculoskeletal:  Negative for gait problem and joint swelling.     Physical Exam Triage Vital Signs ED Triage Vitals  Enc Vitals Group     BP 06/26/22 1850 (!) 147/100     Pulse Rate 06/26/22 1850 (!) 104     Resp 06/26/22 1850 14     Temp 06/26/22 1850 97.8 F (36.6 C)     Temp Source 06/26/22 1850 Oral     SpO2 06/26/22 1850 98 %     Weight --      Height --      Head Circumference --      Peak Flow --      Pain Score 06/26/22 1849 10     Pain Loc --      Pain Edu? --      Excl. in [place]? --    No data found.  Updated Vital Signs BP (!) 147/100 (BP Location: Left Arm)   Pulse (!) 104   Temp 97.8 F (36.6 C) (Oral)   Resp 14   SpO2 98%   Visual Acuity Right Eye Distance:   Left Eye Distance:   Bilateral Distance:    Right Eye Near:   Left Eye Near:    Bilateral Near:     Physical Exam Vitals and nursing note reviewed.  Constitutional:  Appearance: Normal appearance.  HENT:     Head: Normocephalic and atraumatic.     Right Ear: External ear normal.     Left Ear: External ear normal.     Mouth/Throat:     Mouth: Mucous membranes are moist.  Eyes:     Conjunctiva/sclera: Conjunctivae normal.  Cardiovascular:     Rate and Rhythm: Normal rate.  Pulmonary:     Effort: Pulmonary effort is normal. No respiratory distress.  Neurological:     General: No focal deficit present.     Mental Status: He is alert and oriented to person, place, and time.  Psychiatric:        Behavior: Behavior is agitated. Behavior is cooperative.      UC Treatments / Results  Labs (all labs ordered are listed, but only abnormal results are displayed) Labs Reviewed - No data to display  EKG   Radiology No results found.  Procedures Procedures (including critical care time)  Medications Ordered in UC Medications  dexamethasone (DECADRON) injection 10 mg (has no administration in time range)    Initial Impression / Assessment and Plan / UC Course  I have  reviewed the triage vital signs and the nursing notes.  Pertinent labs & imaging results that were available during my care of the patient were reviewed by me and considered in my medical decision making (see chart for details).  Vitals and triage reviewed, patient is hemodynamically stable.  Presents to clinic requesting steroid injections of both knees.  Discussed that we can do intramuscular, but he should be evaluated by orthopedic if he is to get joint injections.  Of note, did see orthopedics last week and they did not do joint injections.  Advised to follow-up with [facility], patient verbalized understanding.  No questions at this time.     Final Clinical Impressions(s) / UC Diagnoses   Final diagnoses:  Acute pain of both knees     Discharge Instructions      We have given you a steroid injection in clinic to help with your bilateral knee pain.  It is important that you follow-up with an orthopedic for further evaluation.  You can consider going to [facility], they have the capability do to joint injections there if indicated.      ED Prescriptions   None    PDMP not reviewed this encounter.   Akeela Busk, [place], [place] 06/26/22 [phone]

## 2022-06-26 NOTE — ED Triage Notes (Signed)
Pt reports bilateral knee pain due to arthritis for 2-3 months. Taken medications and nothing helping and requesting cortisone injections in knees.

## 2022-06-26 NOTE — Discharge Instructions (Addendum)
We have given you a steroid injection in clinic to help with your bilateral knee pain.  It is important that you follow-up with an orthopedic for further evaluation.  You can consider going to [facility], they have the capability do to joint injections there if indicated.

## 2022-07-02 ENCOUNTER — Ambulatory Visit: Payer: Self-pay | Admitting: Internal Medicine

## 2022-07-11 ENCOUNTER — Emergency Department (HOSPITAL_COMMUNITY): Payer: Medicaid Other

## 2022-07-11 ENCOUNTER — Other Ambulatory Visit: Payer: Self-pay

## 2022-07-11 ENCOUNTER — Encounter (HOSPITAL_COMMUNITY): Payer: Self-pay | Admitting: *Deleted

## 2022-07-11 ENCOUNTER — Observation Stay (HOSPITAL_COMMUNITY): Payer: Medicaid Other

## 2022-07-11 ENCOUNTER — Inpatient Hospital Stay (HOSPITAL_COMMUNITY)
Admission: EM | Admit: 2022-07-11 | Discharge: 2022-07-23 | DRG: 438 | Disposition: A | Discharge status: Home / Self Care | Payer: Medicaid Other | Attending: Family Medicine | Admitting: Family Medicine

## 2022-07-11 DIAGNOSIS — R739 Hyperglycemia, unspecified: Secondary | ICD-10-CM | POA: Diagnosis present

## 2022-07-11 DIAGNOSIS — F10930 Alcohol use, unspecified with withdrawal, uncomplicated: Secondary | ICD-10-CM | POA: Diagnosis not present

## 2022-07-11 DIAGNOSIS — Z79899 Other long term (current) drug therapy: Secondary | ICD-10-CM

## 2022-07-11 DIAGNOSIS — Z1152 Encounter for screening for COVID-19: Secondary | ICD-10-CM

## 2022-07-11 DIAGNOSIS — E8721 Acute metabolic acidosis: Secondary | ICD-10-CM | POA: Diagnosis present

## 2022-07-11 DIAGNOSIS — K449 Diaphragmatic hernia without obstruction or gangrene: Secondary | ICD-10-CM | POA: Diagnosis present

## 2022-07-11 DIAGNOSIS — R112 Nausea with vomiting, unspecified: Secondary | ICD-10-CM

## 2022-07-11 DIAGNOSIS — E878 Other disorders of electrolyte and fluid balance, not elsewhere classified: Secondary | ICD-10-CM | POA: Diagnosis present

## 2022-07-11 DIAGNOSIS — Z8249 Family history of ischemic heart disease and other diseases of the circulatory system: Secondary | ICD-10-CM

## 2022-07-11 DIAGNOSIS — K852 Alcohol induced acute pancreatitis without necrosis or infection: Secondary | ICD-10-CM

## 2022-07-11 DIAGNOSIS — B1910 Unspecified viral hepatitis B without hepatic coma: Secondary | ICD-10-CM | POA: Diagnosis present

## 2022-07-11 DIAGNOSIS — Z6823 Body mass index (BMI) 23.0-23.9, adult: Secondary | ICD-10-CM

## 2022-07-11 DIAGNOSIS — F10939 Alcohol use, unspecified with withdrawal, unspecified: Secondary | ICD-10-CM

## 2022-07-11 DIAGNOSIS — G9341 Metabolic encephalopathy: Secondary | ICD-10-CM | POA: Diagnosis not present

## 2022-07-11 DIAGNOSIS — J810 Acute pulmonary edema: Secondary | ICD-10-CM | POA: Diagnosis not present

## 2022-07-11 DIAGNOSIS — F10239 Alcohol dependence with withdrawal, unspecified: Secondary | ICD-10-CM | POA: Diagnosis present

## 2022-07-11 DIAGNOSIS — Z789 Other specified health status: Secondary | ICD-10-CM | POA: Diagnosis not present

## 2022-07-11 DIAGNOSIS — E871 Hypo-osmolality and hyponatremia: Secondary | ICD-10-CM | POA: Diagnosis present

## 2022-07-11 DIAGNOSIS — K7011 Alcoholic hepatitis with ascites: Secondary | ICD-10-CM | POA: Diagnosis not present

## 2022-07-11 DIAGNOSIS — K567 Ileus, unspecified: Secondary | ICD-10-CM | POA: Diagnosis not present

## 2022-07-11 DIAGNOSIS — Z9189 Other specified personal risk factors, not elsewhere classified: Secondary | ICD-10-CM | POA: Insufficient documentation

## 2022-07-11 DIAGNOSIS — K859 Acute pancreatitis without necrosis or infection, unspecified: Secondary | ICD-10-CM | POA: Diagnosis present

## 2022-07-11 DIAGNOSIS — D539 Nutritional anemia, unspecified: Secondary | ICD-10-CM | POA: Diagnosis not present

## 2022-07-11 DIAGNOSIS — E8770 Fluid overload, unspecified: Secondary | ICD-10-CM | POA: Diagnosis present

## 2022-07-11 DIAGNOSIS — K760 Fatty (change of) liver, not elsewhere classified: Secondary | ICD-10-CM | POA: Diagnosis present

## 2022-07-11 DIAGNOSIS — I820 Budd-Chiari syndrome: Secondary | ICD-10-CM | POA: Diagnosis not present

## 2022-07-11 DIAGNOSIS — I82890 Acute embolism and thrombosis of other specified veins: Secondary | ICD-10-CM | POA: Diagnosis present

## 2022-07-11 DIAGNOSIS — K8581 Other acute pancreatitis with uninfected necrosis: Secondary | ICD-10-CM | POA: Diagnosis not present

## 2022-07-11 DIAGNOSIS — R188 Other ascites: Secondary | ICD-10-CM | POA: Diagnosis present

## 2022-07-11 DIAGNOSIS — F1090 Alcohol use, unspecified, uncomplicated: Secondary | ICD-10-CM | POA: Insufficient documentation

## 2022-07-11 DIAGNOSIS — E876 Hypokalemia: Secondary | ICD-10-CM | POA: Diagnosis present

## 2022-07-11 DIAGNOSIS — E43 Unspecified severe protein-calorie malnutrition: Secondary | ICD-10-CM | POA: Diagnosis present

## 2022-07-11 DIAGNOSIS — K853 Drug induced acute pancreatitis without necrosis or infection: Secondary | ICD-10-CM | POA: Diagnosis not present

## 2022-07-11 DIAGNOSIS — K7010 Alcoholic hepatitis without ascites: Secondary | ICD-10-CM | POA: Diagnosis not present

## 2022-07-11 DIAGNOSIS — D649 Anemia, unspecified: Secondary | ICD-10-CM | POA: Diagnosis not present

## 2022-07-11 DIAGNOSIS — Y906 Blood alcohol level of 120-199 mg/100 ml: Secondary | ICD-10-CM | POA: Diagnosis present

## 2022-07-11 DIAGNOSIS — R579 Shock, unspecified: Secondary | ICD-10-CM

## 2022-07-11 DIAGNOSIS — R9431 Abnormal electrocardiogram [ECG] [EKG]: Secondary | ICD-10-CM

## 2022-07-11 DIAGNOSIS — N179 Acute kidney failure, unspecified: Secondary | ICD-10-CM | POA: Diagnosis not present

## 2022-07-11 DIAGNOSIS — G9340 Encephalopathy, unspecified: Secondary | ICD-10-CM | POA: Diagnosis not present

## 2022-07-11 DIAGNOSIS — R0609 Other forms of dyspnea: Secondary | ICD-10-CM | POA: Diagnosis not present

## 2022-07-11 DIAGNOSIS — J9601 Acute respiratory failure with hypoxia: Secondary | ICD-10-CM | POA: Diagnosis not present

## 2022-07-11 DIAGNOSIS — I10 Essential (primary) hypertension: Secondary | ICD-10-CM | POA: Diagnosis present

## 2022-07-11 DIAGNOSIS — Z8719 Personal history of other diseases of the digestive system: Secondary | ICD-10-CM | POA: Diagnosis present

## 2022-07-11 DIAGNOSIS — K8521 Alcohol induced acute pancreatitis with uninfected necrosis: Principal | ICD-10-CM | POA: Diagnosis present

## 2022-07-11 HISTORY — DX: Alcohol use, unspecified with withdrawal, unspecified: F10.939

## 2022-07-11 LAB — BASIC METABOLIC PANEL
Anion gap: 24 — ABNORMAL HIGH (ref 5–15)
Anion gap: 20 — ABNORMAL HIGH (ref 5–15)
Anion gap: 17 — ABNORMAL HIGH (ref 5–15)
BUN: <5 mg/dL — ABNORMAL LOW (ref 6–20)
BUN: 5 mg/dL — ABNORMAL LOW (ref 6–20)
BUN: 5 mg/dL — ABNORMAL LOW (ref 6–20)
CO2: 15 mmol/L — ABNORMAL LOW (ref 22–32)
CO2: 18 mmol/L — ABNORMAL LOW (ref 22–32)
CO2: 21 mmol/L — ABNORMAL LOW (ref 22–32)
Calcium: 8 mg/dL — ABNORMAL LOW (ref 8.9–10.3)
Calcium: 7.3 mg/dL — ABNORMAL LOW (ref 8.9–10.3)
Calcium: 7 mg/dL — ABNORMAL LOW (ref 8.9–10.3)
Chloride: 100 mmol/L (ref 98–111)
Chloride: 104 mmol/L (ref 98–111)
Chloride: 103 mmol/L (ref 98–111)
Creatinine, Ser: 1.2 mg/dL (ref 0.61–1.24)
Creatinine, Ser: 0.91 mg/dL (ref 0.61–1.24)
Creatinine, Ser: 0.87 mg/dL (ref 0.61–1.24)
GFR, Estimated: >60 mL/min (ref >60)
GFR, Estimated: >60 mL/min (ref >60)
GFR, Estimated: >60 mL/min (ref >60)
Glucose, Bld: 138 mg/dL — ABNORMAL HIGH (ref 70–99)
Glucose, Bld: 165 mg/dL — ABNORMAL HIGH (ref 70–99)
Glucose, Bld: 180 mg/dL — ABNORMAL HIGH (ref 70–99)
Potassium: 2.5 mmol/L — CL (ref 3.5–5.1)
Potassium: 3.1 mmol/L — ABNORMAL LOW (ref 3.5–5.1)
Potassium: 3.2 mmol/L — ABNORMAL LOW (ref 3.5–5.1)
Sodium: 139 mmol/L (ref 135–145)
Sodium: 142 mmol/L (ref 135–145)
Sodium: 141 mmol/L (ref 135–145)

## 2022-07-11 LAB — LACTIC ACID, PLASMA
Lactic Acid, Venous: 8.5 mmol/L (ref 0.5–1.9)
Lactic Acid, Venous: 7.8 mmol/L (ref 0.5–1.9)
Lactic Acid, Venous: 3.7 mmol/L (ref 0.5–1.9)
Lactic Acid, Venous: 3.2 mmol/L (ref 0.5–1.9)

## 2022-07-11 LAB — I-STAT CHEM 8, ED
BUN: 5 mg/dL — ABNORMAL LOW (ref 6–20)
Calcium, Ion: 0.96 mmol/L — ABNORMAL LOW (ref 1.15–1.40)
Chloride: 101 mmol/L (ref 98–111)
Creatinine, Ser: 1.4 mg/dL — ABNORMAL HIGH (ref 0.61–1.24)
Glucose, Bld: 144 mg/dL — ABNORMAL HIGH (ref 70–99)
HCT: 36 % — ABNORMAL LOW (ref 39.0–52.0)
Hemoglobin: 12.2 g/dL — ABNORMAL LOW (ref 13.0–17.0)
Potassium: 2.3 mmol/L — CL (ref 3.5–5.1)
Sodium: 138 mmol/L (ref 135–145)
TCO2: 18 mmol/L — ABNORMAL LOW (ref 22–32)

## 2022-07-11 LAB — CBC WITH DIFFERENTIAL/PLATELET
Abs Immature Granulocytes: 0.04 10*3/uL (ref 0.00–0.07)
Basophils Absolute: 0 10*3/uL (ref 0.0–0.1)
Basophils Relative: 1 %
Eosinophils Absolute: 0 10*3/uL (ref 0.0–0.5)
Eosinophils Relative: 0 %
HCT: 30.2 % — ABNORMAL LOW (ref 39.0–52.0)
Hemoglobin: 10.3 g/dL — ABNORMAL LOW (ref 13.0–17.0)
Immature Granulocytes: 1 %
Lymphocytes Relative: 17 %
Lymphs Abs: 1.3 10*3/uL (ref 0.7–4.0)
MCH: 34.6 pg — ABNORMAL HIGH (ref 26.0–34.0)
MCHC: 34.1 g/dL (ref 30.0–36.0)
MCV: 101.3 fL — ABNORMAL HIGH (ref 80.0–100.0)
Monocytes Absolute: 0.5 10*3/uL (ref 0.1–1.0)
Monocytes Relative: 7 %
Neutro Abs: 5.9 10*3/uL (ref 1.7–7.7)
Neutrophils Relative %: 74 %
Platelets: 232 10*3/uL (ref 150–400)
RBC: 2.98 MIL/uL — ABNORMAL LOW (ref 4.22–5.81)
RDW: 12.4 % (ref 11.5–15.5)
WBC: 7.8 10*3/uL (ref 4.0–10.5)
nRBC: 0.4 % — ABNORMAL HIGH (ref 0.0–0.2)

## 2022-07-11 LAB — MAGNESIUM
Magnesium: 1.1 mg/dL — ABNORMAL LOW (ref 1.7–2.4)
Magnesium: 1.3 mg/dL — ABNORMAL LOW (ref 1.7–2.4)

## 2022-07-11 LAB — HEPATIC FUNCTION PANEL
ALT: 72 U/L — ABNORMAL HIGH (ref 0–44)
AST: 177 U/L — ABNORMAL HIGH (ref 15–41)
Albumin: 2.9 g/dL — ABNORMAL LOW (ref 3.5–5.0)
Alkaline Phosphatase: 63 U/L (ref 38–126)
Bilirubin, Direct: 0.4 mg/dL — ABNORMAL HIGH (ref 0.0–0.2)
Indirect Bilirubin: 1.1 mg/dL — ABNORMAL HIGH (ref 0.3–0.9)
Total Bilirubin: 1.5 mg/dL — ABNORMAL HIGH (ref 0.3–1.2)
Total Protein: 5.3 g/dL — ABNORMAL LOW (ref 6.5–8.1)

## 2022-07-11 LAB — PROTIME-INR
INR: 1.2 (ref 0.8–1.2)
Prothrombin Time: 15.2 seconds (ref 11.4–15.2)

## 2022-07-11 LAB — LIPID PANEL
Cholesterol: 192 mg/dL (ref 0–200)
HDL: 104 mg/dL (ref >40)
LDL Cholesterol: 72 mg/dL (ref 0–99)
Total CHOL/HDL Ratio: 1.8 RATIO
Triglycerides: 79 mg/dL (ref <150)
VLDL: 16 mg/dL (ref 0–40)

## 2022-07-11 LAB — HIV ANTIBODY (ROUTINE TESTING W REFLEX): HIV Screen 4th Generation wRfx: NONREACTIVE

## 2022-07-11 LAB — TROPONIN I (HIGH SENSITIVITY)
Troponin I (High Sensitivity): 5 ng/L (ref <18)
Troponin I (High Sensitivity): 5 ng/L (ref <18)

## 2022-07-11 LAB — TYPE AND SCREEN
ABO/RH(D): AB POS
Antibody Screen: NEGATIVE

## 2022-07-11 LAB — HEPATITIS PANEL, ACUTE
Hep A IgM: NONREACTIVE
Hep B C IgM: REACTIVE — AB
Hepatitis B Surface Ag: NONREACTIVE

## 2022-07-11 LAB — ABO/RH: ABO/RH(D): AB POS

## 2022-07-11 LAB — LIPASE, BLOOD: Lipase: 1737 U/L — ABNORMAL HIGH (ref 11–51)

## 2022-07-11 LAB — AMMONIA: Ammonia: 50 umol/L — ABNORMAL HIGH (ref 9–35)

## 2022-07-11 LAB — ETHANOL: Alcohol, Ethyl (B): 156 mg/dL — ABNORMAL HIGH (ref <10)

## 2022-07-11 MED ORDER — METOCLOPRAMIDE HCL 5 MG/ML IJ SOLN
10.0000 mg | Freq: Once | INTRAMUSCULAR | Status: AC
  Administered 2022-07-11: 10 mg via INTRAVENOUS
  Filled 2022-07-11: qty 2

## 2022-07-11 MED ORDER — LORAZEPAM 2 MG/ML IJ SOLN
1.0000 mg | INTRAMUSCULAR | Status: AC | PRN
  Administered 2022-07-11 – 2022-07-13 (×3): 1 mg via INTRAVENOUS
  Administered 2022-07-13: 4 mg via INTRAVENOUS
  Administered 2022-07-13 (×3): 2 mg via INTRAVENOUS
  Administered 2022-07-14: 4 mg via INTRAVENOUS
  Administered 2022-07-14: 2 mg via INTRAVENOUS
  Filled 2022-07-11 (×2): qty 1
  Filled 2022-07-11: qty 2
  Filled 2022-07-11 (×4): qty 1
  Filled 2022-07-11: qty 2
  Filled 2022-07-11: qty 1

## 2022-07-11 MED ORDER — THIAMINE MONONITRATE 100 MG PO TABS
100.0000 mg | ORAL_TABLET | Freq: Every day | ORAL | Status: DC
  Administered 2022-07-11 – 2022-07-15 (×4): 100 mg via ORAL
  Filled 2022-07-11 (×5): qty 1

## 2022-07-11 MED ORDER — ORAL CARE MOUTH RINSE: 15.0000 mL | OROMUCOSAL | Status: DC | PRN

## 2022-07-11 MED ORDER — POTASSIUM CHLORIDE 10 MEQ/100ML IV SOLN
10.0000 meq | INTRAVENOUS | Status: AC
  Administered 2022-07-11 (×4): 10 meq via INTRAVENOUS
  Filled 2022-07-11 (×4): qty 100

## 2022-07-11 MED ORDER — HYDROMORPHONE HCL 1 MG/ML IJ SOLN
1.0000 mg | INTRAMUSCULAR | Status: DC | PRN
  Administered 2022-07-11 – 2022-07-17 (×25): 1 mg via INTRAVENOUS
  Filled 2022-07-11 (×26): qty 1

## 2022-07-11 MED ORDER — MAGNESIUM SULFATE 2 GM/50ML IV SOLN
2.0000 g | Freq: Once | INTRAVENOUS | Status: AC
  Administered 2022-07-11: 2 g via INTRAVENOUS
  Filled 2022-07-11: qty 50

## 2022-07-11 MED ORDER — SODIUM CHLORIDE 0.9 % IV BOLUS
1000.0000 mL | Freq: Once | INTRAVENOUS | Status: AC
  Administered 2022-07-11: 1000 mL via INTRAVENOUS

## 2022-07-11 MED ORDER — FENTANYL CITRATE PF 50 MCG/ML IJ SOSY: 100.0000 ug | PREFILLED_SYRINGE | Freq: Once | INTRAMUSCULAR | Status: DC

## 2022-07-11 MED ORDER — FOLIC ACID 1 MG PO TABS
1.0000 mg | ORAL_TABLET | Freq: Every day | ORAL | Status: DC
  Administered 2022-07-11 – 2022-07-13 (×3): 1 mg via ORAL
  Filled 2022-07-11 (×3): qty 1

## 2022-07-11 MED ORDER — ONDANSETRON HCL 4 MG/2ML IJ SOLN
4.0000 mg | Freq: Once | INTRAMUSCULAR | Status: AC
  Administered 2022-07-11: 4 mg via INTRAVENOUS
  Filled 2022-07-11: qty 2

## 2022-07-11 MED ORDER — FENTANYL CITRATE PF 50 MCG/ML IJ SOSY
100.0000 ug | PREFILLED_SYRINGE | Freq: Once | INTRAMUSCULAR | Status: AC
  Administered 2022-07-11: 100 ug via INTRAVENOUS
  Filled 2022-07-11: qty 2

## 2022-07-11 MED ORDER — LORAZEPAM 1 MG PO TABS
1.0000 mg | ORAL_TABLET | ORAL | Status: AC | PRN
  Administered 2022-07-12 – 2022-07-13 (×2): 1 mg via ORAL
  Administered 2022-07-13 (×2): 2 mg via ORAL
  Filled 2022-07-11: qty 1
  Filled 2022-07-11 (×3): qty 2

## 2022-07-11 MED ORDER — THIAMINE HCL 100 MG/ML IJ SOLN
100.0000 mg | Freq: Every day | INTRAMUSCULAR | Status: DC
  Administered 2022-07-13: 100 mg via INTRAVENOUS
  Filled 2022-07-11 (×2): qty 2

## 2022-07-11 MED ORDER — FENTANYL CITRATE PF 50 MCG/ML IJ SOSY
50.0000 ug | PREFILLED_SYRINGE | INTRAMUSCULAR | Status: DC | PRN
  Administered 2022-07-11 (×2): 50 ug via INTRAVENOUS
  Filled 2022-07-11 (×2): qty 1

## 2022-07-11 MED ORDER — LORAZEPAM 2 MG/ML IJ SOLN
0.5000 mg | Freq: Three times a day (TID) | INTRAMUSCULAR | Status: DC
  Administered 2022-07-11 – 2022-07-12 (×4): 0.5 mg via INTRAVENOUS
  Filled 2022-07-11 (×4): qty 1

## 2022-07-11 MED ORDER — ADULT MULTIVITAMIN W/MINERALS CH
1.0000 | ORAL_TABLET | Freq: Every day | ORAL | Status: DC
  Administered 2022-07-11 – 2022-07-23 (×13): 1 via ORAL
  Filled 2022-07-11 (×13): qty 1

## 2022-07-11 MED ORDER — LORAZEPAM 1 MG PO TABS
1.0000 mg | ORAL_TABLET | ORAL | Status: DC | PRN
  Administered 2022-07-11 (×2): 1 mg via ORAL
  Filled 2022-07-11 (×2): qty 1

## 2022-07-11 MED ORDER — LORAZEPAM 2 MG/ML IJ SOLN: 1.0000 mg | INTRAMUSCULAR | Status: DC | PRN

## 2022-07-11 MED ORDER — POTASSIUM CHLORIDE 10 MEQ/100ML IV SOLN
10.0000 meq | INTRAVENOUS | Status: DC
  Administered 2022-07-11 (×5): 10 meq via INTRAVENOUS
  Filled 2022-07-11 (×5): qty 100

## 2022-07-11 MED ORDER — POLYETHYLENE GLYCOL 3350 17 G PO PACK: 17.0000 g | PACK | Freq: Every day | ORAL | Status: DC | PRN

## 2022-07-11 MED ORDER — ENOXAPARIN SODIUM 40 MG/0.4ML IJ SOSY
40.0000 mg | PREFILLED_SYRINGE | Freq: Every day | INTRAMUSCULAR | Status: DC
  Administered 2022-07-12 – 2022-07-23 (×12): 40 mg via SUBCUTANEOUS
  Filled 2022-07-11 (×13): qty 0.4

## 2022-07-11 MED ORDER — FENTANYL CITRATE PF 50 MCG/ML IJ SOSY
25.0000 ug | PREFILLED_SYRINGE | Freq: Once | INTRAMUSCULAR | Status: AC
  Administered 2022-07-11: 25 ug via INTRAVENOUS
  Filled 2022-07-11: qty 1

## 2022-07-11 MED ORDER — IOHEXOL 350 MG/ML SOLN
100.0000 mL | Freq: Once | INTRAVENOUS | Status: AC | PRN
  Administered 2022-07-11: 100 mL via INTRAVENOUS

## 2022-07-11 MED ORDER — POTASSIUM CHLORIDE 10 MEQ/100ML IV SOLN
10.0000 meq | Freq: Once | INTRAVENOUS | Status: AC
  Administered 2022-07-11: 10 meq via INTRAVENOUS
  Filled 2022-07-11: qty 100

## 2022-07-11 MED ORDER — LACTATED RINGERS IV SOLN
INTRAVENOUS | Status: DC
  Administered 2022-07-13: 150 mL/h via INTRAVENOUS

## 2022-07-11 NOTE — Assessment & Plan Note (Signed)
Mag 1.1 on admission, patient received 2 g.  Likely in the setting of GI losses. - Recheck mag at 1500 - Mag level in the morning - Replete as appropriate

## 2022-07-11 NOTE — H&P (Addendum)
Hospital Admission History and Physical Service Pager: [phone]  Patient name: Alexander [name] Medical record number: [id number] Date of Birth: [birth year] Age: 42 y.o. Gender: male  Primary Care Provider: Patient, No Pcp Per Consultants: CCM in the ER Code Status: Full Preferred Emergency Contact:   Name Relation Home Work Mobile   [name],Alexander Mother [phone]    Alexander [name] [phone] is a friend that is another approved contact (either mother or friend are reported medical decision makers per patient)  Chief Complaint: Abdominal pain  Assessment and Plan: Alexander [name] is a 42 y.o. male presenting with abdominal pain with nausea and vomiting . Differential for presentation of this includes pancreatitis, aortic aneurysm (negative on CT ab/pel), alcohol withdrawal, atypical ACS (unlikely given normal troponin x2).   Hospital Problem List      Hospital     * (Principal) Acute pancreatitis     Patient presented with <12 hours of significant nausea vomiting and  abdominal pain.  CT abdomen/pelvis on admission showed signs consistent  with acute pancreatitis.  Vitals were concerning with hypotension  initially and patient was evaluated by CCM, who stated patient was stable  for the floor as vitals that improved after fluid resuscitation. Patient has significant history of alcoholism, which likely contributed to  current picture as we do not have an obvious infectious etiology.  Will  treat with fluid resuscitation and pain management.  We will need to be  careful with pain management control as patient is also being treated for  alcohol withdrawal, complication could be respiratory depression if  patient is overmedicated. - Admit to FMT[facility] attending Dr. Miquel Dunn - Progressive floor - Cardiac telemetry  - Vitals per routine - LR @150mL /h  - Fentanyl q2h PRN for severe pain - Miralax daily PRN for bowel regimen - Current diet of ice chips and sips with meds, will advance as  able - Given prolonged QT on EKG, will try to treat nausea with the Ativan that  is per CIWA protocol - Lipid panel to ensure not related to TG - Consider RUQ when patient able to tolerate to r/o gallstones        Alcohol withdrawal (HCC)     Patient drinks 8oz of gin anywhere from 5-10 times daily, last drink  was last night. Patient already displaying signs of some withdrawal with  tremors in the room.  Patient's picture will be difficult to treat given  the significant pain and metabolic derangements with current acute  pancreatitis as well as elevated liver enzymes.  Will need to ensure that  we do not overmedicate patient while trying to treat both withdrawal and  pain. - CIWA protocol - Ativan 1 - 4 mg every hour as needed, closely monitor scoring - Scheduled Ativan 0.5mg  TID, increase as necessary - Monitor BMPs - Cardiac telemetry - A.m. CMP - Thiamine 100 mg daily - Folic acid 1 mg daily        Hypokalemia     Significant hypokalemia in the setting of acute pancreatitis with  significant vomiting, likely due to GI loss.  Patient currently receiving  6 rounds of IV potassium, concerned that he will not tolerate additional  oral supplementation at this time. - BMP at 10 AM and at 1500 - Consider every 4 BMP if persistently hypokalemic - A.m. CMP also ordered        Hypomagnesemia     Mag 1.1 on admission, patient received 2 g.  Likely in the setting  of  GI losses. - Recheck mag at 1500 - Mag level in the morning - Replete as appropriate        Acute lactic acidosis     Lactic acid 8.5 on admission, patient received 3 L of NS.  Repeat  lactic acid improving at 7.8. Will need to closely monitor for improvement  as this current presentation can also be similar with pain for ischemic  bowel, though if it is related to ischemia I would not expect much  improvement in the lactic acid measurements. - Continue to trend lactic acid q3h x2 measurements - IVF with LR at  150 mL/h        Anemia     Incidental finding on admission to 10.3.  Previous check last year was  14.  Hemoccult done in the ER that was negative.  Do not feel that there  is an acute bleeding source at this time, though we will continue to  monitor if patient develops signs of GI bleed. - Continue to monitor - Consider iron studies when not acutely ill        Prolonged QT interval     Likely secondary to electrolyte abnormalities from recent GI vomiting,  but will need to closely monitor.  Will try to avoid QTc prolonging  medications as we are able while still adequately treating the patient.        Hepatic steatosis     Evidence of severe hepatic steatosis incidentally found on CT  abdomen/pelvis.  Patient at risk for liver pathology given elevated liver  enzymes in the setting of chronic heavy alcoholism and also found to have  small volume of ascites on imaging as well. - Continue to monitor for further development of ascites - Following up with PCP        History of sexual behavior with high risk of exposure to communicable  disease     Patient previously sexually active with men and treated in past years  for STD infections.  Patient has not been sexually active in the recent  months.  Prescription of disco V is listed under historical medications,  patient reports that he does not take any medications.  HIV test in 2021  was nonreactive, the prescription is listed as being in 2023.  Unclear why  this medication was prescribed, we will obtain testing today. - HIV testing        Ascites     Noted on CT abdomen/pelvis. Not previously diagnosed. Low likelihood of  SBP given that patient doesn't have white count or signs of bacterial  infection, though we will need to get a sample when patient can tolerate.        FEN/GI: NPO sips with meds, advance as tolerated VTE Prophylaxis: Lovenox SubQ  Disposition: observation, progressive  History of Present Illness:   Alexander [name] is a 42 y.o. male presenting with significant abdominal pain and nausea/vomiting  Patient reports that last night he had eaten a little bit of food and then laid down and began to get dizzy and started vomiting sometime around midnight.  He has been vomiting all night and has been feeling significant abdominal pain.  He reports no acute changes to his routine or foods last night.  Denies any recent fevers or other systemic symptoms beyond pain.  He does report that he is still in a lot of pain and is nauseous at this time.  Patient does report that he is a heavy drinker.  He reports drinking gin, at least 8 ounces between 5-10 times per day.  He has never had alcohol withdrawal as he is not stop drinking long enough, though he does know he will start to get shakes if he waits too long to drink.  He has not gone more than a day without drinking in many years.  States he was diagnosed with reactive arthritis recently and follows with the [facility] department as of a few weeks ago.  Not currently sexually active but has male partners.  Reports he has no medical diagnoses and is not currently on any medications.  In the ED, Patient presented with sudden nausea/vomiting and upper abdominal pain.  Workup in the ER consistent with pancreatitis given lipase of 1700, lactic acid of 8.5, ethanol 156, AST 167, ALT 72, K2.3, mag 1.1.  CTA abdomen pelvis was obtained to rule out dissection and noted acute pancreatitis.  Patient given supplementation for potassium and magnesium.  Pain was treated with fentanyl and nausea treated with metoclopramide and Zofran. Patient was initially hypotensive and given 3 L of NS. PCCM was consulted initially given hypotension and concern for shock, but patient's blood pressure had improved after fluids and CCM felt patient was appropriate for the floor.   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   Pertinent Past Surgical History: None  Remainder reviewed in history tab.   Pertinent Social History: Tobacco use: Former quit several years ago. Vapes nicotine products, unsure of amount Alcohol use: 8oz of gin 5-10x daily  Other Substance use: none Lives with Ethelene Browns (roommate)  Pertinent Family History: Heart disease: heart disease (CHF) Diabetes: grandmother HTN: father and mother  Remainder reviewed in history tab.   Important Outpatient Medications: Tylenol PRN  Remainder reviewed in medication history.   Objective: BP (!) 133/97   Pulse 70   Temp 98 F (36.7 C)   Resp (!) 21   SpO2 100%  Exam: General -- oriented x3, appears anxious and mildly tremulous. HEENT -- Head is normocephalic. PERRLA. EOMI. Ears, nose and throat were benign. Neck -- supple; no bruits. Integument -- intact. No rash, erythema, or ecchymoses.  Chest -- good expansion. Lungs clear to auscultation. Cardiac -- RRR. No murmurs noted.  Abdomen --soft, significant abdominal pain in the epigastric region, generalized abdominal pain to mild palpation also present, no rebound tenderness, no distention CNS -- cranial nerves II through XII grossly intact.  Extremities - no tenderness or effusions noted. ROM good. 5/5 bilateral strength. Dorsalis pedis pulses present and symmetrical.    Labs:  CBC BMET  Recent Labs  Lab 07/11/22 0401  WBC 7.8  HGB 10.3*  HCT 30.2*  PLT 232   Recent Labs  Lab 07/11/22 0401  NA 139  K 2.5*  CL 100  CO2 15*  BUN <5*  CREATININE 1.20  GLUCOSE 138*  CALCIUM 8.0*    Pertinent additional labs: Lipase 1737, AST 177, ALT 72, K2.5, anion gap 24, mag 1.1, troponin 5, alcohol 156, INR 1.2, lactic acid 8.5> 7.8  EKG: Reviewed by me.  Sinus rhythm, prolonged QTc interval at 571    Imaging Studies Performed:  CT ANGIO CHEST/ABD/PEL FOR DISSECTION W &/OR WO  CONTRAST Result Date: 07/11/2022 IMPRESSION: 1. No findings to suggest acute aortic syndrome. 2. Evidence of acute pancreatitis, as above. 3. Aortic atherosclerosis, in addition to left main and  left anterior descending coronary artery disease. Please note that although the presence of coronary artery calcium documents the presence of coronary artery disease, the severity of this disease and any potential stenosis cannot be assessed on this non-gated CT examination. Assessment for potential risk factor modification, dietary therapy or pharmacologic therapy may be warranted, if clinically indicated. 4. Severe hepatic steatosis. 5. Small volume of ascites. 6. Moderate hiatal hernia. 7. Additional incidental findings, as above. Electronically Signed   By: Trudie Reed M.D.   On: 07/11/2022 Grace Isaac, DO 07/11/2022, 12:24 PM PGY-3, [facility]  FPTS Intern pager: [phone], text pages welcome Secure chat group [facility] Teaching Service

## 2022-07-11 NOTE — ED Provider Notes (Addendum)
EMERGENCY DEPARTMENT AT [facility] Provider Note   CSN: [id number] Arrival date & time: 07/11/22  4098     History  Chief Complaint  Patient presents with   Abdominal Pain   Chest Pain    Alexander [name] is a 42 y.o. male.  Patient presents to the emergency department for evaluation of chest and abdominal pain.  Patient reports that he was starting to have abdominal discomfort when he went to bed tonight, pain has worsened.  Patient reports that the pain became severe, causing him to call EMS.  EMS report one episode of emesis at the house, no blood in the vomit.  He reports that he feels like he needs to have a bowel movement currently, has not had any melanotic stools or rectal bleeding.  Patient reports that he is a daily drinker.  He denies any history of GI bleeding.  He also denies history of pancreatitis.  Pain is across the middle of the abdomen.  Patient transported him home, has been hypotensive during transport.       Home Medications Prior to Admission medications   Medication Sig Start Date End Date Taking? Authorizing Provider  acetaminophen (TYLENOL) 500 MG tablet Take 1 tablet (500 mg total) by mouth every 6 (six) hours as needed. Patient taking differently: Take 500 mg by mouth every 6 (six) hours as needed for mild pain. 02/07/22  Yes Rising, Rebecca, PA-C  DESCOVY 200-25 MG tablet Take 1 tablet by mouth daily. 02/22/21  Yes [provider]      Allergies    Patient has no known allergies.    Review of Systems   Review of Systems  Physical Exam Updated Vital Signs BP 95/68   Pulse (!) 57   Resp (!) 25   SpO2 100%  Physical Exam Vitals and nursing note reviewed.  Constitutional:      General: He is in acute distress.     Appearance: He is well-developed. He is ill-appearing.  HENT:     Head: Normocephalic and atraumatic.     Mouth/Throat:     Mouth: Mucous membranes are moist.  Eyes:     General: Vision grossly  intact. Gaze aligned appropriately.     Extraocular Movements: Extraocular movements intact.     Conjunctiva/sclera: Conjunctivae normal.  Cardiovascular:     Rate and Rhythm: Normal rate and regular rhythm.     Pulses: Normal pulses.     Heart sounds: Normal heart sounds, S1 normal and S2 normal. No murmur heard.    No friction rub. No gallop.  Pulmonary:     Effort: Pulmonary effort is normal. No respiratory distress.     Breath sounds: Normal breath sounds.  Abdominal:     Palpations: Abdomen is soft.     Tenderness: There is generalized abdominal tenderness. There is no guarding or rebound.     Hernia: No hernia is present.  Musculoskeletal:        General: No swelling.     Cervical back: Full passive range of motion without pain, normal range of motion and neck supple. No pain with movement, spinous process tenderness or muscular tenderness. Normal range of motion.     Right lower leg: No edema.     Left lower leg: No edema.  Skin:    General: Skin is warm and dry.     Capillary Refill: Capillary refill takes less than 2 seconds.     Findings: No ecchymosis, erythema, lesion or wound.  Neurological:     Mental Status: He is alert and oriented to person, place, and time.     GCS: GCS eye subscore is 4. GCS verbal subscore is 5. GCS motor subscore is 6.     Cranial Nerves: Cranial nerves 2-12 are intact.     Sensory: Sensation is intact.     Motor: Motor function is intact. No weakness or abnormal muscle tone.     Coordination: Coordination is intact.  Psychiatric:        Mood and Affect: Mood normal.        Speech: Speech normal.        Behavior: Behavior normal.     ED Results / Procedures / Treatments   Labs (all labs ordered are listed, but only abnormal results are displayed) Labs Reviewed  CBC WITH DIFFERENTIAL/PLATELET - Abnormal; Notable for the following components:      Result Value   RBC 2.98 (*)    Hemoglobin 10.3 (*)    HCT 30.2 (*)    MCV 101.3 (*)     MCH 34.6 (*)    nRBC 0.4 (*)    All other components within normal limits  BASIC METABOLIC PANEL - Abnormal; Notable for the following components:   Potassium 2.5 (*)    CO2 15 (*)    Glucose, Bld 138 (*)    BUN <5 (*)    Calcium 8.0 (*)    Anion gap 24 (*)    All other components within normal limits  HEPATIC FUNCTION PANEL - Abnormal; Notable for the following components:   Total Protein 5.3 (*)    Albumin 2.9 (*)    AST 177 (*)    ALT 72 (*)    Total Bilirubin 1.5 (*)    Bilirubin, Direct 0.4 (*)    Indirect Bilirubin 1.1 (*)    All other components within normal limits  ETHANOL - Abnormal; Notable for the following components:   Alcohol, Ethyl (B) 156 (*)    All other components within normal limits  AMMONIA - Abnormal; Notable for the following components:   Ammonia 50 (*)    All other components within normal limits  LIPASE, BLOOD - Abnormal; Notable for the following components:   Lipase 1,737 (*)    All other components within normal limits  LACTIC ACID, PLASMA - Abnormal; Notable for the following components:   Lactic Acid, Venous 8.5 (*)    All other components within normal limits  MAGNESIUM - Abnormal; Notable for the following components:   Magnesium 1.1 (*)    All other components within normal limits  I-STAT CHEM 8, ED - Abnormal; Notable for the following components:   Potassium 2.3 (*)    BUN 5 (*)    Creatinine, Ser 1.40 (*)    Glucose, Bld 144 (*)    Calcium, Ion 0.96 (*)    TCO2 18 (*)    Hemoglobin 12.2 (*)    HCT 36.0 (*)    All other components within normal limits  PROTIME-INR  LACTIC ACID, PLASMA  POC OCCULT BLOOD, ED  TYPE AND SCREEN  TROPONIN I (HIGH SENSITIVITY)  TROPONIN I (HIGH SENSITIVITY)    EKG EKG Interpretation  Date/Time:  Thursday July 11 2022 03:15:55 EDT Ventricular Rate:  64 PR Interval:  140 QRS Duration: 95 QT Interval:  553 QTC Calculation: 571 R Axis:   70 Text Interpretation: Sinus rhythm ST elev, probable  normal early repol pattern Prolonged QT interval Confirmed by Gilda Crease (  [id number]) on 07/11/2022 3:32:24 AM  Radiology CT ANGIO CHEST/ABD/PEL FOR DISSECTION W &/OR WO CONTRAST  Result Date: 07/11/2022 CLINICAL DATA:  42 year old male with suspected aortic aneurysm. Chest heaviness and syncope. EXAM: CT ANGIOGRAPHY CHEST, ABDOMEN AND PELVIS TECHNIQUE: Non-contrast CT of the chest was initially obtained. Multidetector CT imaging through the chest, abdomen and pelvis was performed using the standard protocol during bolus administration of intravenous contrast. Multiplanar reconstructed images and MIPs were obtained and reviewed to evaluate the vascular anatomy. RADIATION DOSE REDUCTION: This exam was performed according to the departmental dose-optimization program which includes automated exposure control, adjustment of the mA and/or kV according to patient size and/or use of iterative reconstruction technique. CONTRAST:  OMNIPAQUE IOHEXOL 350 MG/ML SOLN COMPARISON:  No priors. FINDINGS: CTA CHEST FINDINGS Cardiovascular: Precontrast images demonstrate no crescentic high attenuation associated with the wall of the thoracic aorta to suggest the presence of acute intramural hemorrhage. Postcontrast images demonstrate no evidence of thoracic aortic aneurysm or dissection. The ascending thoracic aorta, mid arch and descending thoracic aorta measure 3.8 cm, 2.9 cm and 2.6 cm in diameter respectively. No atherosclerotic calcifications are noted in the thoracic aorta. However, there is calcified atherosclerotic plaque in the left main and left anterior descending coronary arteries. Heart size is normal. There is no significant pericardial fluid, thickening or pericardial calcification. Mediastinum/Nodes: No pathologically enlarged mediastinal or hilar lymph nodes. Moderate-sized hiatal hernia. No axillary lymphadenopathy. Lungs/Pleura: No acute consolidative airspace disease. No pleural effusions. No  suspicious appearing pulmonary nodules or masses are noted. Musculoskeletal: There are no aggressive appearing lytic or blastic lesions noted in the visualized portions of the skeleton. Review of the MIP images confirms the above findings. CTA ABDOMEN AND PELVIS FINDINGS VASCULAR Aorta: Aortic atherosclerosis. Normal caliber aorta without aneurysm, dissection, vasculitis or significant stenosis. Celiac: Patent without evidence of aneurysm, dissection, vasculitis or significant stenosis. SMA: Patent without evidence of aneurysm, dissection, vasculitis or significant stenosis. Renals: Both renal arteries are patent without evidence of aneurysm, dissection, vasculitis, fibromuscular dysplasia or significant stenosis. IMA: Patent without evidence of aneurysm, dissection, vasculitis or significant stenosis. Inflow: Patent without evidence of aneurysm, dissection, vasculitis or significant stenosis. Veins: No obvious venous abnormality within the limitations of this arterial phase study. Review of the MIP images confirms the above findings. NON-VASCULAR Hepatobiliary: Severe diffuse low attenuation throughout the hepatic parenchyma, indicative of a background of severe hepatic steatosis. No discrete cystic or solid hepatic lesions are confidently identified on today's examination. Unenhanced appearance of the gallbladder is unremarkable. Pancreas: Large amount of peripancreatic fluid and inflammatory changes concerning for an acute pancreatitis. No discrete pancreatic mass. No pancreatic ductal dilatation. No well organized peripancreatic fluid collections. Spleen: Unremarkable. Adrenals/Urinary Tract: Bilateral kidneys and adrenal glands are normal in appearance. No hydroureteronephrosis. Urinary bladder is unremarkable in appearance. Stomach/Bowel: The appearance of the stomach is unremarkable. Inflammatory changes are noted surrounding the duodenum, likely secondary to adjacent pancreatitis. No pathologic dilatation of  small bowel or colon. A few scattered colonic diverticuli are noted, without definite focal surrounding inflammatory changes to indicate an acute diverticulitis at this time. The appendix is unremarkable. Lymphatic: No lymphadenopathy noted in the abdomen or pelvis. Reproductive: Prostate gland and seminal vesicles are unremarkable in appearance. Other: Small volume of ascites. Retroperitoneal fluid centered around the pancreas. No pneumoperitoneum. Musculoskeletal: There are no aggressive appearing lytic or blastic lesions noted in the visualized portions of the skeleton. Review of the MIP images confirms the above findings. IMPRESSION: 1. No findings to suggest acute aortic  syndrome. 2. Evidence of acute pancreatitis, as above. 3. Aortic atherosclerosis, in addition to left main and left anterior descending coronary artery disease. Please note that although the presence of coronary artery calcium documents the presence of coronary artery disease, the severity of this disease and any potential stenosis cannot be assessed on this non-gated CT examination. Assessment for potential risk factor modification, dietary therapy or pharmacologic therapy may be warranted, if clinically indicated. 4. Severe hepatic steatosis. 5. Small volume of ascites. 6. Moderate hiatal hernia. 7. Additional incidental findings, as above. Electronically Signed   By: Trudie Reed M.D.   On: 07/11/2022 06:02    Procedures .Critical Care  Performed by: Gilda Crease, MD Authorized by: Gilda Crease, MD   Critical care provider statement:    Critical care time (minutes):  30   Critical care was necessary to treat or prevent imminent or life-threatening deterioration of the following conditions:  Shock and metabolic crisis   Critical care was time spent personally by me on the following activities:  Development of treatment plan with patient or surrogate, discussions with consultants, evaluation of patient's  response to treatment, examination of patient, ordering and review of laboratory studies, ordering and review of radiographic studies, ordering and performing treatments and interventions, pulse oximetry, re-evaluation of patient's condition and review of old charts   I assumed direction of critical care for this patient from another provider in my specialty: no     Care discussed with: admitting provider       Medications Ordered in ED Medications  LORazepam (ATIVAN) tablet 1-4 mg (has no administration in time range)  thiamine (VITAMIN B1) tablet 100 mg (has no administration in time range)    Or  thiamine (VITAMIN B1) injection 100 mg (has no administration in time range)  folic acid (FOLVITE) tablet 1 mg (has no administration in time range)  multivitamin with minerals tablet 1 tablet (has no administration in time range)  potassium chloride 10 mEq in 100 mL IVPB (10 mEq Intravenous New Bag/Given 07/11/22 0544)  magnesium sulfate IVPB 2 g 50 mL (has no administration in time range)  sodium chloride 0.9 % bolus 1,000 mL (0 mLs Intravenous Stopped 07/11/22 0524)    Followed by  sodium chloride 0.9 % bolus 1,000 mL (0 mLs Intravenous Stopped 07/11/22 0524)  fentaNYL (SUBLIMAZE) injection 25 mcg (25 mcg Intravenous Given 07/11/22 0443)  sodium chloride 0.9 % bolus 1,000 mL (1,000 mLs Intravenous New Bag/Given 07/11/22 0540)  ondansetron (ZOFRAN) injection 4 mg (4 mg Intravenous Given 07/11/22 0520)  iohexol (OMNIPAQUE) 350 MG/ML injection 100 mL (100 mLs Intravenous Contrast Given 07/11/22 0541)    ED Course/ Medical Decision Making/ A&P                             Medical Decision Making Amount and/or Complexity of Data Reviewed Labs: ordered. Decision-making details documented in ED Course. Radiology: ordered and independent interpretation performed. Decision-making details documented in ED Course. ECG/medicine tests: ordered and independent interpretation performed. Decision-making  details documented in ED Course.  Risk OTC drugs. Prescription drug management.   Differential Diagnosis considered includes, but not limited to: Cholelithiasis; cholecystitis; cholangitis; bowel obstruction; esophagitis; gastritis; peptic ulcer disease; pancreatitis; cardiac.  Patient presents with severe mid and upper abdominal pain.  He is hypotensive at arrival.  He does admit to alcoholism.  He did vomit once but there was no hematemesis.  He has not had any melanotic  stools.  Rectal exam performed at arrival was Hemoccult negative.  Patient aggressively hydrated with some improvement of his hypotension.  LFTs are mildly abnormal.  Patient with mild anemia at his baseline.  Normal troponin, no obvious ischemia on EKG.  Patient found to be fairly profoundly hypokalemic and hypomagnesemic.  Lactic acid returned at 8.5.  Patient sent for CT angiography of chest, abdomen and pelvis to rule out bowel ischemia as a cause of his lactic acidosis, aortic syndrome as a cause of his chest and abdominal pain.  At this time he is lipase was still pending.  Lipase has returned at 1737.  Acute pancreatitis does explain his symptoms.  He still appears fairly ill.  He is receiving his third liter of fluid and repeat lactic acid pending.  Discussed with critical care on call, Dr. Isaiah Serge.  Critical care will evaluate the patient to see if he is appropriate for critical care service or hospitalist service.  Addendum: Critical care has seen the patient, as he is improved, they feel that the patient is appropriate for Medicine Service.        Final Clinical Impression(s) / ED Diagnoses Final diagnoses:  Shock (HCC)  Alcohol-induced acute pancreatitis, unspecified complication status    Rx / DC Orders ED Discharge Orders     None         Ryana Montecalvo, Canary Brim, MD 07/11/22 1610    Gilda Crease, MD 07/11/22 [phone]

## 2022-07-11 NOTE — Assessment & Plan Note (Signed)
Patient previously sexually active with men and treated in past years for STD infections.  Patient has not been sexually active in the recent months.  Prescription of disco V is listed under historical medications, patient reports that he does not take any medications.  HIV test in 2021 was nonreactive, the prescription is listed as being in 2023.  Unclear why this medication was prescribed, we will obtain testing today. - HIV testing

## 2022-07-11 NOTE — Assessment & Plan Note (Addendum)
Lactic acid 8.5 on admission, patient received 3 L of NS.  Repeat lactic acid improving at 7.8. - Continue to trend lactic acid q3h x2 measurements - IVF with LR at 150 mL/h

## 2022-07-11 NOTE — Assessment & Plan Note (Signed)
Likely secondary to electrolyte abnormalities from recent GI vomiting, but will need to closely monitor.  Will try to avoid QTc prolonging medications as we are able while still adequately treating the patient.

## 2022-07-11 NOTE — Assessment & Plan Note (Signed)
Incidental finding on admission to 10.3.  Previous check last year was 14.  Hemoccult done in the ER that was negative.  Do not feel that there is an acute bleeding source at this time, though we will continue to monitor if patient develops signs of GI bleed. - Continue to monitor - Consider iron studies when not acutely ill

## 2022-07-11 NOTE — ED Triage Notes (Addendum)
Pt arrived with EMS from home for abd and chest pain. Woke up this morning with chest heaviness, had a syncopal episode after standing. EMS unable to obtain radial pulses or bp initially. After NS and  4mg  zofran, bp 79/49.  C/o epigastric and lower abd pain, NV, tender on palpation. Pt appears pale, restless, and uncomfortable. Pt refused c-collar (post fall) with ems

## 2022-07-11 NOTE — Consult Note (Addendum)
Attending physician's note   I have taken a history, reviewed the chart, and examined the patient. I performed a substantive portion of this encounter, including complete performance of at least one of the key components, in conjunction with the APP. I agree with the APP's note, impression, and recommendations with my edits.   42 year old male with a history of EtOH use disorder, admitted with acute alcoholic pancreatitis.  Admits to a history of regular EtOH use, including drinking first thing in the morning, then resumes drinking in the evening after he is done with work.  Primarily drinks gin, and 750 mL bottle will last him just 1.5 days.  Last drink was last evening.  No prior history of pancreatitis to his knowledge.  Admission evaluation notable for the following: - Lactate 8.5--> 7.8--> 3.7 - K2.3 --> 2.5--> 3.1 - Creatinine 1.4 --> 0.91 today.  Otherwise normal BUN - WBC 7.8, H/H 10.3/30.2, PLT 232 - Ethanol 156 - Lipase 1737 - Normal PT/INR - CT angio C/A/P: Large amount of peripancreatic fluid consistent with acute pancreatitis.  No PD dilation.  Severe hepatic steatosis without mass or duct dilation.  Otherwise normal-appearing GI tract.  1) EtOH pancreatitis 2) Abdominal pain 3) Nausea - No hemoconcentration, renal failure, pleural effusion.  BISAP score of 0 conveys low risk of mortality - Increase IV fluid with LR at least 250 cc/hour.  No history of heart failure/heart disease.  Typically consider acute pancreatitis patients to be at least 6-8 L deficit on arrival.  Continue with aggressive fluid management - Antiemetics and pain management per primary Medicine service - N.p.o. (ice chips okay) for the time being - No role for endoscopic evaluation  3) Lactic acidosis - Improving - Continue trending serial lactate  4) AKI-resolving 5) Hypokalemia-improving 6) Hypomagnesemia - Continue serial BMP checks with electrolyte repletion per protocol  7) EtOH use  disorder - CIWA protocol - Thiamine, folic acid  Inpatient GI service will sign off at this time.  Please do not hesitate to contact us with additional questions or concerns.  [address] DO, FACG [phone] office         Referring Provider: Teaching service Primary Care Physician:  Patient, No Pcp Per Primary Gastroenterologist:  Gentry Fitz  Reason for Consultation:  Pancreatitis  HPI: Alexander [name] is a 42 y.o. male with limited past medical history who presented to [facility] with complaints of abdominal pain with nausea and vomiting.  Lipase elevated at 1700, normal white blood cell count and renal function.  Potassium is low.  He is afebrile and is not tachycardic.  He does have an elevated lactic acid, but this did not come down considerably with a couple of fluid boluses in the ED.  CT scan of the abdomen and pelvis with contrast as follows:  IMPRESSION: 1. No findings to suggest acute aortic syndrome. 2. Evidence of acute pancreatitis, as above. 3. Aortic atherosclerosis, in addition to left main and left anterior descending coronary artery disease. Please note that although the presence of coronary artery calcium documents the presence of coronary artery disease, the severity of this disease and any potential stenosis cannot be assessed on this non-gated CT examination. Assessment for potential risk factor modification, dietary therapy or pharmacologic therapy may be warranted, if clinically indicated. 4. Severe hepatic steatosis. 5. Small volume of ascites. 6. Moderate hiatal hernia. 7. Additional incidental findings, as above.  During our visit he says that he was having 8/10 pain.  Says that  he was over a 10 when he came in.  Says that he was up all night vomiting.  Last drink was last night.  ETOH level was over 150.  Drinks gin, 750 mL bottle last 1.5 days.  No past medical history on file.  No past surgical history on file.  Prior to  Admission medications   Medication Sig Start Date End Date Taking? Authorizing Provider  acetaminophen (TYLENOL) 500 MG tablet Take 1 tablet (500 mg total) by mouth every 6 (six) hours as needed. Patient taking differently: Take 500 mg by mouth every 6 (six) hours as needed for mild pain. 02/07/22  Yes Rising, Rebecca, PA-C  DESCOVY 200-25 MG tablet Take 1 tablet by mouth daily. 02/22/21  Yes [provider]    Current Facility-Administered Medications  Medication Dose Route Frequency Provider Last Rate Last Admin   [START ON 07/12/2022] enoxaparin (LOVENOX) injection 40 mg  40 mg Subcutaneous Daily Lilland, Alana, DO       folic acid (FOLVITE) tablet 1 mg  1 mg Oral Daily Lilland, Alana, DO   1 mg at 07/11/22 1148   HYDROmorphone (DILAUDID) injection 1 mg  1 mg Intravenous Q2H PRN Billey Co, MD   1 mg at 07/11/22 1323   lactated ringers infusion   Intravenous Continuous Lilland, Alana, DO 150 mL/hr at 07/11/22 0858 New Bag at 07/11/22 0858   LORazepam (ATIVAN) injection 0.5 mg  0.5 mg Intravenous TID Lilland, Alana, DO   0.5 mg at 07/11/22 1440   LORazepam (ATIVAN) tablet 1-4 mg  1-4 mg Oral Q1H PRN Lilland, Alana, DO   1 mg at 07/11/22 1147   multivitamin with minerals tablet 1 tablet  1 tablet Oral Daily Lilland, Alana, DO   1 tablet at 07/11/22 1147   polyethylene glycol (MIRALAX / GLYCOLAX) packet 17 g  17 g Oral Daily PRN Lilland, Alana, DO       potassium chloride 10 mEq in 100 mL IVPB  10 mEq Intravenous Once Billey Co, MD       thiamine (VITAMIN B1) tablet 100 mg  100 mg Oral Daily Lilland, Alana, DO   100 mg at 07/11/22 1147   Or   thiamine (VITAMIN B1) injection 100 mg  100 mg Intravenous Daily Lilland, Alana, DO        Allergies as of 07/11/2022   (No Known Allergies)    Family History  Problem Relation Age of Onset   Healthy Mother    Hypertension Father     Social History   Socioeconomic History   Marital status: Single    Spouse name: Not on  file   Number of children: Not on file   Years of education: Not on file   Highest education level: Not on file  Occupational History   Not on file  Tobacco Use   Smoking status: Never   Smokeless tobacco: Never  Substance and Sexual Activity   Alcohol use: Yes   Drug use: Never   Sexual activity: Not on file  Other Topics Concern   Not on file  Social History Narrative   Not on file   Social Determinants of Health   Financial Resource Strain: Not on file  Food Insecurity: Not on file  Transportation Needs: Not on file  Physical Activity: Not on file  Stress: Not on file  Social Connections: Not on file  Intimate Partner Violence: Not on file    Review of Systems: ROS is O/W negative except as  mentioned in HPI.  Physical Exam: Vital signs in last 24 hours: Temp:  [97.6 F (36.4 C)-98 F (36.7 C)] 97.6 F (36.4 C) (06/13 1429) Pulse Rate:  [57-87] 81 (06/13 1429) Resp:  [17-30] 17 (06/13 1429) BP: (63-155)/(52-113) 147/106 (06/13 1429) SpO2:  [97 %-100 %] 100 % (06/13 1429)   General:  Sleepy, slow mentation. Head:  Normocephalic and atraumatic. Eyes:  Sclera clear, no icterus.  Conjunctiva pink. Ears:  Normal auditory acuity. Mouth:  No deformity or lesions.   Lungs:  Clear throughout to auscultation.  No wheezes, crackles, or rhonchi.  Heart:  Regular rate and rhythm; no murmurs, clicks, rubs, or gallops. Abdomen:  Soft, non-distended.  BS present but quiet.  TTP.   Msk:  Symmetrical without gross deformities. Pulses:  Normal pulses noted. Extremities:  Without clubbing or edema. Neurologic:  Alert and oriented x 4;  grossly normal neurologically. Skin:  Intact without significant lesions or rashes. Psych:  Alert and cooperative. Normal mood and affect.  Intake/Output from previous day: 06/12 0701 - 06/13 0700 In: 2000 [IV Piggyback:2000] Out: -   Lab Results: Recent Labs    07/11/22 0329 07/11/22 0401  WBC  --  7.8  HGB 12.2* 10.3*  HCT 36.0*  30.2*  PLT  --  232   BMET Recent Labs    07/11/22 0329 07/11/22 0401 07/11/22 1240  NA 138 139 142  K 2.3* 2.5* 3.1*  CL 101 100 104  CO2  --  15* 18*  GLUCOSE 144* 138* 165*  BUN 5* <5* 5*  CREATININE 1.40* 1.20 0.91  CALCIUM  --  8.0* 7.3*   LFT Recent Labs    07/11/22 0401  PROT 5.3*  ALBUMIN 2.9*  AST 177*  ALT 72*  ALKPHOS 63  BILITOT 1.5*  BILIDIR 0.4*  IBILI 1.1*   PT/INR Recent Labs    07/11/22 0401  LABPROT 15.2  INR 1.2   Studies/Results: CT ANGIO CHEST/ABD/PEL FOR DISSECTION W &/OR WO CONTRAST  Result Date: 07/11/2022 CLINICAL DATA:  42 year old male with suspected aortic aneurysm. Chest heaviness and syncope. EXAM: CT ANGIOGRAPHY CHEST, ABDOMEN AND PELVIS TECHNIQUE: Non-contrast CT of the chest was initially obtained. Multidetector CT imaging through the chest, abdomen and pelvis was performed using the standard protocol during bolus administration of intravenous contrast. Multiplanar reconstructed images and MIPs were obtained and reviewed to evaluate the vascular anatomy. RADIATION DOSE REDUCTION: This exam was performed according to the departmental dose-optimization program which includes automated exposure control, adjustment of the mA and/or kV according to patient size and/or use of iterative reconstruction technique. CONTRAST:  OMNIPAQUE IOHEXOL 350 MG/ML SOLN COMPARISON:  No priors. FINDINGS: CTA CHEST FINDINGS Cardiovascular: Precontrast images demonstrate no crescentic high attenuation associated with the wall of the thoracic aorta to suggest the presence of acute intramural hemorrhage. Postcontrast images demonstrate no evidence of thoracic aortic aneurysm or dissection. The ascending thoracic aorta, mid arch and descending thoracic aorta measure 3.8 cm, 2.9 cm and 2.6 cm in diameter respectively. No atherosclerotic calcifications are noted in the thoracic aorta. However, there is calcified atherosclerotic plaque in the left main and left  anterior descending coronary arteries. Heart size is normal. There is no significant pericardial fluid, thickening or pericardial calcification. Mediastinum/Nodes: No pathologically enlarged mediastinal or hilar lymph nodes. Moderate-sized hiatal hernia. No axillary lymphadenopathy. Lungs/Pleura: No acute consolidative airspace disease. No pleural effusions. No suspicious appearing pulmonary nodules or masses are noted. Musculoskeletal: There are no aggressive appearing lytic or blastic lesions noted in  the visualized portions of the skeleton. Review of the MIP images confirms the above findings. CTA ABDOMEN AND PELVIS FINDINGS VASCULAR Aorta: Aortic atherosclerosis. Normal caliber aorta without aneurysm, dissection, vasculitis or significant stenosis. Celiac: Patent without evidence of aneurysm, dissection, vasculitis or significant stenosis. SMA: Patent without evidence of aneurysm, dissection, vasculitis or significant stenosis. Renals: Both renal arteries are patent without evidence of aneurysm, dissection, vasculitis, fibromuscular dysplasia or significant stenosis. IMA: Patent without evidence of aneurysm, dissection, vasculitis or significant stenosis. Inflow: Patent without evidence of aneurysm, dissection, vasculitis or significant stenosis. Veins: No obvious venous abnormality within the limitations of this arterial phase study. Review of the MIP images confirms the above findings. NON-VASCULAR Hepatobiliary: Severe diffuse low attenuation throughout the hepatic parenchyma, indicative of a background of severe hepatic steatosis. No discrete cystic or solid hepatic lesions are confidently identified on today's examination. Unenhanced appearance of the gallbladder is unremarkable. Pancreas: Large amount of peripancreatic fluid and inflammatory changes concerning for an acute pancreatitis. No discrete pancreatic mass. No pancreatic ductal dilatation. No well organized peripancreatic fluid collections. Spleen:  Unremarkable. Adrenals/Urinary Tract: Bilateral kidneys and adrenal glands are normal in appearance. No hydroureteronephrosis. Urinary bladder is unremarkable in appearance. Stomach/Bowel: The appearance of the stomach is unremarkable. Inflammatory changes are noted surrounding the duodenum, likely secondary to adjacent pancreatitis. No pathologic dilatation of small bowel or colon. A few scattered colonic diverticuli are noted, without definite focal surrounding inflammatory changes to indicate an acute diverticulitis at this time. The appendix is unremarkable. Lymphatic: No lymphadenopathy noted in the abdomen or pelvis. Reproductive: Prostate gland and seminal vesicles are unremarkable in appearance. Other: Small volume of ascites. Retroperitoneal fluid centered around the pancreas. No pneumoperitoneum. Musculoskeletal: There are no aggressive appearing lytic or blastic lesions noted in the visualized portions of the skeleton. Review of the MIP images confirms the above findings. IMPRESSION: 1. No findings to suggest acute aortic syndrome. 2. Evidence of acute pancreatitis, as above. 3. Aortic atherosclerosis, in addition to left main and left anterior descending coronary artery disease. Please note that although the presence of coronary artery calcium documents the presence of coronary artery disease, the severity of this disease and any potential stenosis cannot be assessed on this non-gated CT examination. Assessment for potential risk factor modification, dietary therapy or pharmacologic therapy may be warranted, if clinically indicated. 4. Severe hepatic steatosis. 5. Small volume of ascites. 6. Moderate hiatal hernia. 7. Additional incidental findings, as above. Electronically Signed   By: Trudie Reed M.D.   On: 07/11/2022 06:02    IMPRESSION:  *Acute ETOH pancreatitis:  Lipase of 1700.  Bisap score of 0 so low risk of progression to necrosis, etc.  He does not have a leukocytosis, is not  tachycardic, is afebrile, no renal compromise.  His lactic acid is still high, but has come down significantly. *ETOH abuse: Monitor for withdrawal.  PLAN: -Continue supportive care with IV hydration, would recommend 250 cc/hr of LR.  Otherwise pain management and antiemetics.  Ice chips and sips with meds okay.  Princella Pellegrini. Zehr  07/11/2022, 2:40 PM

## 2022-07-11 NOTE — Progress Notes (Signed)
FMTS Brief Progress Note  S: To see patient at bedside with Dr. Velna Ochs.  Patient is slightly given multiple doses of Ativan and Dilaudid throughout the day.  He is oriented to person, place, situation, did not answer year.  He is moving all extremities equally.  He states that he still has abdominal pain but now is a 7 out of 10 previously was 10 out of 10.   O: BP (!) 142/101 (BP Location: Right Arm)   Pulse (!) 106   Temp 97.8 F (36.6 C) (Oral)   Resp 19   Ht 5\' 7"  (1.702 m)   Wt 67 kg   SpO2 100%   BMI 23.13 kg/m    General: NAD, responsive to questions sluggishly Head: Normocephalic atraumatic CV: Regular rate and rhythm no murmurs rubs or gallops Respiratory: chest rises symmetrically,  no increased work of breathing Abdomen: slightly tensed, not significantly tender to palpation, soft BS Extremities: Moves upper and lower extremities freely, slight tremor when hands extended Neuro: No focal deficits   A/P: Acute pancreatitis GI saw today and increase fluids to 250/hr. currently treating nausea with Ativan.  QTc on new EKG slightly improved to 486. Discussed if worsening could consider different antiemetic. Doing okay right now. RUQ [place] with fatty liver and no other abnormality  Alcohol withdrawal  CIWA protocol last CIWA of 9. Oriented but slugggish to respond which he acknowledges he is quite sleepy. Likely in setting of significant ativan and dilaudid use. No focal deficits Will monitor closely overnight-low threshold to involve CCM if not controlled. - Orders reviewed. Labs for AM ordered, which was adjusted as needed.    Levin Erp, MD 07/11/2022, 7:54 PM PGY-2, [facility] Night Resident  Please page [phone] with questions.

## 2022-07-11 NOTE — Assessment & Plan Note (Signed)
>>  ASSESSMENT AND PLAN FOR HYPOMAGNESEMIA WRITTEN ON 07/11/2022  8:47 AM BY LILLAND, ALANA, DO  Mag 1.1 on admission, patient received 2 g.  Likely in the setting of GI losses. - Recheck mag at 1500 - Mag level in the morning - Replete as appropriate

## 2022-07-11 NOTE — Assessment & Plan Note (Addendum)
Patient presented with <12 hours of significant nausea vomiting and abdominal pain.  CT abdomen/pelvis on admission showed signs consistent with acute pancreatitis.  Vitals were concerning with hypotension initially and patient was evaluated by CCM, who stated patient was stable for the floor as vitals that improved after fluid resuscitation. Patient has significant history of alcoholism, which likely contributed to current picture as we do not have an obvious infectious etiology.  Will treat with fluid resuscitation and pain management.  We will need to be careful with pain management control as patient is also being treated for alcohol withdrawal, complication could be respiratory depression if patient is overmedicated. - Admit to FMTS attending Dr. Miquel Dunn, progressive floor - Cardiac telemetry  - Vitals per routine - LR @150mL /h  - Fentanyl q2h PRN for severe pain - Miralax daily PRN for bowel regimen - Current diet of ice chips and sips with meds, will advance as able - Given prolonged QT on EKG, will try to treat nausea with the Ativan that is per CIWA protocol

## 2022-07-11 NOTE — ED Notes (Signed)
Pt continues to dry heave and also vomit small amounts of bile.

## 2022-07-11 NOTE — Assessment & Plan Note (Addendum)
Patient drinks 8oz of gin anywhere from 5-10 times daily, last drink was 2 nights ago.  CIWA's have been mildly elevated, and he appears to be withdrawing on exam. - CIWA protocol - Ativan 1 - 4 mg every hour as needed, closely monitor scoring - Scheduled Ativan 0.5mg  TID, increase as necessary - Monitor BMPs - Cardiac telemetry - A.m. CMP - Thiamine 100 mg daily - Folic acid 1 mg daily

## 2022-07-11 NOTE — Consult Note (Signed)
NAME:  JONIEL KOKOSZKA, MRN:  [id number], DOB:  04-20-1980, LOS: 0 ADMISSION DATE:  07/11/2022, CONSULTATION DATE:  07/11/22 REFERRING MD:  Blinda Leatherwood, CHIEF COMPLAINT:  n/v   History of Present Illness:  Alexander [name] is a 42 y.o. M with PMH significant for ETOH abuse who presented to the ED with the sudden onset of nausea, vomiting and upper abdominal pain overnight.  He reports drinking daily, but says he isn't sure how much.  In the ED work-up was consistent with pancreatitis with lipase 1700 and lactic acid 8.5, ethanol 156, AST 177 and ALT 72 with CT evidence of acute pancreatitis.   He was initially hypotensive and PCCM was consulted, however BP improved after IVF  Pertinent  Medical History  ETOH abuse   Significant Hospital Events: Including procedures, antibiotic start and stop dates in addition to other pertinent events   6/13 presented with nausea, vomiting and hypotension  Interim History / Subjective:  MAPS now consistently >70 and nausea and vomiting has improved  Objective   Blood pressure 95/68, pulse (!) 57, resp. rate (!) 25, SpO2 100 %.        Intake/Output Summary (Last 24 hours) at 07/11/2022 0631 Last data filed at 07/11/2022 0524 Gross per 24 hour  Intake 2000 ml  Output --  Net 2000 ml   There were no vitals filed for this visit.  General:  young M, resting in bed uncomfortable appearing but not in acute distress HEENT: MM pink/moist Neuro: alert and oriented  CV: s1s2 tachycardic, no m/r/g PULM:  comfortable on RA without tachypnea or accessory muscle use GI: soft, distended and diffusely TTP worse in the epigastric region Extremities: warm/dry, no edema  Skin: no rashes or lesions   Resolved Hospital Problem list     Assessment & Plan:    Acute Alcoholic pancreatitis  Hypotension Lactic acidosis Elevated Transaminases Hypokalemia and Hypomagnesemia -blood pressure has improved, no current indication for ICU admission -continue IVF, pain and  nausea control and trend labs -monitor for withdrawal symptoms -folic acid and thiamine -bowel rest  -replete and trend electrolytes -thank you for this consult, PCCM is available if his clinical condition were to worsen  Best Practice (right click and "Reselect all SmartList Selections" daily)   Per primary  Labs   CBC: Recent Labs  Lab 07/11/22 0329 07/11/22 0401  WBC  --  7.8  NEUTROABS  --  5.9  HGB 12.2* 10.3*  HCT 36.0* 30.2*  MCV  --  101.3*  PLT  --  232    Basic Metabolic Panel: Recent Labs  Lab 07/11/22 0329 07/11/22 0401  NA 138 139  K 2.3* 2.5*  CL 101 100  CO2  --  15*  GLUCOSE 144* 138*  BUN 5* <5*  CREATININE 1.40* 1.20  CALCIUM  --  8.0*  MG  --  1.1*   GFR: CrCl cannot be calculated (Unknown ideal weight.). Recent Labs  Lab 07/11/22 0401  WBC 7.8  LATICACIDVEN 8.5*    Liver Function Tests: Recent Labs  Lab 07/11/22 0401  AST 177*  ALT 72*  ALKPHOS 63  BILITOT 1.5*  PROT 5.3*  ALBUMIN 2.9*   Recent Labs  Lab 07/11/22 0401  LIPASE 1,737*   Recent Labs  Lab 07/11/22 0322  AMMONIA 50*    ABG    Component Value Date/Time   TCO2 18 (L) 07/11/2022 0329     Coagulation Profile: Recent Labs  Lab 07/11/22 0401  INR 1.2  Cardiac Enzymes: No results for input(s): "CKTOTAL", "CKMB", "CKMBINDEX", "TROPONINI" in the last 168 hours.  HbA1C: No results found for: "HGBA1C"  CBG: No results for input(s): "GLUCAP" in the last 168 hours.  Review of Systems:   Please see the history of present illness. All other systems reviewed and are negative    Past Medical History:  He,  has no past medical history on file.   Surgical History:  No past surgical history on file.   Social History:   reports that he has never smoked. He has never used smokeless tobacco. He reports current alcohol use. He reports that he does not use drugs.   Family History:  His family history includes Healthy in his mother; Hypertension in his  father.   Allergies No Known Allergies   Home Medications  Prior to Admission medications   Medication Sig Start Date End Date Taking? Authorizing Provider  acetaminophen (TYLENOL) 500 MG tablet Take 1 tablet (500 mg total) by mouth every 6 (six) hours as needed. Patient taking differently: Take 500 mg by mouth every 6 (six) hours as needed for mild pain. 02/07/22  Yes Rising, Rebecca, PA-C  DESCOVY 200-25 MG tablet Take 1 tablet by mouth daily. 02/22/21  Yes [provider]     Critical care time:  n/a    Darcella Gasman Reha Martinovich, PA-C [facility] See Amion for pager If no response to pager , please call [phone] until 7pm After 7:00 pm call Elink  [phone]

## 2022-07-11 NOTE — ED Notes (Signed)
ED TO INPATIENT HANDOFF REPORT  ED Nurse Name and Phone #:   S Name/Age/Gender Alexander [name] 42 y.o. male Room/Bed: 009C/009C  Code Status   Code Status: Full Code  Home/SNF/Other Home Patient oriented to: self, place, time, and situation Is this baseline? Yes   Triage Complete: Triage complete  Chief Complaint Acute pancreatitis [K85.90]  Triage Note Pt arrived with EMS from home for abd and chest pain. Woke up this morning with chest heaviness, had a syncopal episode after standing. EMS unable to obtain radial pulses or bp initially. After NS and  4mg  zofran, bp 79/49.  C/o epigastric and lower abd pain, NV, tender on palpation. Pt appears pale, restless, and uncomfortable. Pt refused c-collar (post fall) with ems    Allergies No Known Allergies  Level of Care/Admitting Diagnosis ED Disposition     ED Disposition  Admit   Condition  --   Comment  Hospital Area: [facility] [[id number]]  Level of Care: Progressive [102]  Admit to Progressive based on following criteria: MULTISYSTEM THREATS such as stable sepsis, metabolic/electrolyte imbalance with or without encephalopathy that is responding to early treatment.  Admit to Progressive based on following criteria: GI, ENDOCRINE disease patients with GI bleeding, acute liver failure or pancreatitis, stable with diabetic ketoacidosis or thyrotoxicosis (hypothyroid) state.  May place patient in observation at [facility] or [facility] if equivalent level of care is available:: No  Covid Evaluation: Asymptomatic - no recent exposure (last 10 days) testing not required  Diagnosis: Acute pancreatitis [577.0.ICD-9-CM]  Admitting Physician: Billey Co [[id number]]  Attending Physician: Billey Co [[id number]]          B Medical/Surgery History No past medical history on file. No past surgical history on file.   A IV Location/Drains/Wounds Patient Lines/Drains/Airways Status     Active  Line/Drains/Airways     Name Placement date Placement time Site Days   Peripheral IV 07/11/22 20 G Left Antecubital 07/11/22  0318  Antecubital  less than 1   Peripheral IV 07/11/22 20 G Anterior;Left Forearm 07/11/22  0318  Forearm  less than 1            Intake/Output Last 24 hours  Intake/Output Summary (Last 24 hours) at 07/11/2022 1301 Last data filed at 07/11/2022 0524 Gross per 24 hour  Intake 2000 ml  Output --  Net 2000 ml    Labs/Imaging Results for orders placed or performed during the hospital encounter of 07/11/22 (from the past 48 hour(s))  Ammonia     Status: Abnormal   Collection Time: 07/11/22  3:22 AM  Result Value Ref Range   Ammonia 50 (H) 9 - 35 umol/L    Comment: Performed at [facility], [address] [place], [place] [address]  I-stat chem 8, ed     Status: Abnormal   Collection Time: 07/11/22  3:29 AM  Result Value Ref Range   Sodium 138 135 - 145 mmol/L   Potassium 2.3 (LL) 3.5 - 5.1 mmol/L   Chloride 101 98 - 111 mmol/L   BUN 5 (L) 6 - 20 mg/dL   Creatinine, Ser 8.29 (H) 0.61 - 1.24 mg/dL   Glucose, Bld 562 (H) 70 - 99 mg/dL    Comment: Glucose reference range applies only to samples taken after fasting for at least 8 hours.   Calcium, Ion 0.96 (L) 1.15 - 1.40 mmol/L   TCO2 18 (L) 22 - 32 mmol/L   Hemoglobin 12.2 (L)  13.0 - 17.0 g/dL   HCT 16.1 (L) 09.6 - 04.5 %   Comment NOTIFIED PHYSICIAN   CBC with Differential/Platelet     Status: Abnormal   Collection Time: 07/11/22  4:01 AM  Result Value Ref Range   WBC 7.8 4.0 - 10.5 K/uL   RBC 2.98 (L) 4.22 - 5.81 MIL/uL   Hemoglobin 10.3 (L) 13.0 - 17.0 g/dL   HCT 40.9 (L) 81.1 - 91.4 %   MCV 101.3 (H) 80.0 - 100.0 fL   MCH 34.6 (H) 26.0 - 34.0 pg   MCHC 34.1 30.0 - 36.0 g/dL   RDW 78.2 95.6 - 21.3 %   Platelets 232 150 - 400 K/uL   nRBC 0.4 (H) 0.0 - 0.2 %   Neutrophils Relative % 74 %   Neutro Abs 5.9 1.7 - 7.7 K/uL   Lymphocytes Relative 17 %   Lymphs Abs 1.3 0.7 - 4.0 K/uL    Monocytes Relative 7 %   Monocytes Absolute 0.5 0.1 - 1.0 K/uL   Eosinophils Relative 0 %   Eosinophils Absolute 0.0 0.0 - 0.5 K/uL   Basophils Relative 1 %   Basophils Absolute 0.0 0.0 - 0.1 K/uL   Immature Granulocytes 1 %   Abs Immature Granulocytes 0.04 0.00 - 0.07 K/uL    Comment: Performed at [facility], [address], [place], [place] [address]  Basic metabolic panel     Status: Abnormal   Collection Time: 07/11/22  4:01 AM  Result Value Ref Range   Sodium 139 135 - 145 mmol/L   Potassium 2.5 (LL) 3.5 - 5.1 mmol/L    Comment: CRITICAL RESULT CALLED TO, READ BACK BY AND VERIFIED WITH TYLER WALSTON RN 07/11/22 0511 M KOROLESKI   Chloride 100 98 - 111 mmol/L   CO2 15 (L) 22 - 32 mmol/L   Glucose, Bld 138 (H) 70 - 99 mg/dL    Comment: Glucose reference range applies only to samples taken after fasting for at least 8 hours.   BUN <5 (L) 6 - 20 mg/dL   Creatinine, Ser 8.46 0.61 - 1.24 mg/dL   Calcium 8.0 (L) 8.9 - 10.3 mg/dL   GFR, Estimated >96 >29 mL/min    Comment: (NOTE) Calculated using the CKD-EPI Creatinine Equation (2021)    Anion gap 24 (H) 5 - 15    Comment: ELECTROLYTES REPEATED TO VERIFY Performed at [facility], [address], [place], [place] [address]   Hepatic function panel     Status: Abnormal   Collection Time: 07/11/22  4:01 AM  Result Value Ref Range   Total Protein 5.3 (L) 6.5 - 8.1 g/dL   Albumin 2.9 (L) 3.5 - 5.0 g/dL   AST 324 (H) 15 - 41 U/L   ALT 72 (H) 0 - 44 U/L   Alkaline Phosphatase 63 38 - 126 U/L   Total Bilirubin 1.5 (H) 0.3 - 1.2 mg/dL   Bilirubin, Direct 0.4 (H) 0.0 - 0.2 mg/dL   Indirect Bilirubin 1.1 (H) 0.3 - 0.9 mg/dL    Comment: Performed at [facility], [address], [place], [place] [address]  Protime-INR     Status: None   Collection Time: 07/11/22  4:01 AM  Result Value Ref Range   Prothrombin Time 15.2 11.4 - 15.2 seconds   INR 1.2 0.8 - 1.2    Comment: (NOTE) INR goal varies based on device and  disease states. Performed at [facility], [address] [place], [place] [address]   Ethanol     Status: Abnormal   Collection Time: 07/11/22  4:01 AM  Result Value Ref Range   Alcohol, Ethyl (B) 156 (H) <10 mg/dL    Comment: (NOTE) Lowest detectable limit for serum alcohol is 10 mg/dL.  For medical purposes only. Performed at [facility], [address], [place], [place] [address]   Lipase, blood     Status: Abnormal   Collection Time: 07/11/22  4:01 AM  Result Value Ref Range   Lipase 1,737 (H) 11 - 51 U/L    Comment: RESULT CONFIRMED BY MANUAL DILUTION Performed at [facility], [address] [place], [place] [address]   Lactic acid, plasma     Status: Abnormal   Collection Time: 07/11/22  4:01 AM  Result Value Ref Range   Lactic Acid, Venous 8.5 (HH) 0.5 - 1.9 mmol/L    Comment: CRITICAL RESULT CALLED TO, READ BACK BY AND VERIFIED WITH Hollace Hayward RN 07/11/22 0511 Enid Derry Performed at [facility], [address] [place], [place] [address]   Type and screen     Status: None   Collection Time: 07/11/22  4:01 AM  Result Value Ref Range   ABO/RH(D) AB POS    Antibody Screen NEG    Sample Expiration      07/14/2022,2359 Performed at [facility], [address] [place], [place] [address]   Troponin I (High Sensitivity)     Status: None   Collection Time: 07/11/22  4:01 AM  Result Value Ref Range   Troponin I (High Sensitivity) 5 <18 ng/L    Comment: (NOTE) Elevated high sensitivity troponin I (hsTnI) values and significant  changes across serial measurements may suggest ACS but many other  chronic and acute conditions are known to elevate hsTnI results.  Refer to the "Links" section for chest pain algorithms and additional  guidance. Performed at [facility], [address] [place], [place] [address]   Magnesium     Status: Abnormal   Collection Time: 07/11/22  4:01 AM  Result Value Ref Range   Magnesium 1.1 (L)  1.7 - 2.4 mg/dL    Comment: Performed at [facility], [address] [place], [place] [address]  Troponin I (High Sensitivity)     Status: None   Collection Time: 07/11/22  6:22 AM  Result Value Ref Range   Troponin I (High Sensitivity) 5 <18 ng/L    Comment: (NOTE) Elevated high sensitivity troponin I (hsTnI) values and significant  changes across serial measurements may suggest ACS but many other  chronic and acute conditions are known to elevate hsTnI results.  Refer to the "Links" section for chest pain algorithms and additional  guidance. Performed at [facility], [address], [place], [place] [address]   Lactic acid, plasma     Status: Abnormal   Collection Time: 07/11/22  6:22 AM  Result Value Ref Range   Lactic Acid, Venous 7.8 (HH) 0.5 - 1.9 mmol/L    Comment: CRITICAL VALUE NOTED. VALUE IS CONSISTENT WITH PREVIOUSLY REPORTED/CALLED VALUE Performed at [facility], [address] [place], [place] [address]    CT ANGIO CHEST/ABD/PEL FOR DISSECTION W &/OR WO CONTRAST  Result Date: 07/11/2022 CLINICAL DATA:  42 year old male with suspected aortic aneurysm. Chest heaviness and syncope. EXAM: CT ANGIOGRAPHY CHEST, ABDOMEN AND PELVIS TECHNIQUE: Non-contrast CT of the chest was initially obtained. Multidetector CT  imaging through the chest, abdomen and pelvis was performed using the standard protocol during bolus administration of intravenous contrast. Multiplanar reconstructed images and MIPs were obtained and reviewed to evaluate the vascular anatomy. RADIATION DOSE REDUCTION: This exam was performed according to the departmental dose-optimization program which includes automated exposure control, adjustment of the mA and/or kV according to patient size and/or use of iterative reconstruction technique. CONTRAST:  OMNIPAQUE IOHEXOL 350 MG/ML SOLN COMPARISON:  No priors. FINDINGS: CTA CHEST FINDINGS Cardiovascular: Precontrast images demonstrate no crescentic  high attenuation associated with the wall of the thoracic aorta to suggest the presence of acute intramural hemorrhage. Postcontrast images demonstrate no evidence of thoracic aortic aneurysm or dissection. The ascending thoracic aorta, mid arch and descending thoracic aorta measure 3.8 cm, 2.9 cm and 2.6 cm in diameter respectively. No atherosclerotic calcifications are noted in the thoracic aorta. However, there is calcified atherosclerotic plaque in the left main and left anterior descending coronary arteries. Heart size is normal. There is no significant pericardial fluid, thickening or pericardial calcification. Mediastinum/Nodes: No pathologically enlarged mediastinal or hilar lymph nodes. Moderate-sized hiatal hernia. No axillary lymphadenopathy. Lungs/Pleura: No acute consolidative airspace disease. No pleural effusions. No suspicious appearing pulmonary nodules or masses are noted. Musculoskeletal: There are no aggressive appearing lytic or blastic lesions noted in the visualized portions of the skeleton. Review of the MIP images confirms the above findings. CTA ABDOMEN AND PELVIS FINDINGS VASCULAR Aorta: Aortic atherosclerosis. Normal caliber aorta without aneurysm, dissection, vasculitis or significant stenosis. Celiac: Patent without evidence of aneurysm, dissection, vasculitis or significant stenosis. SMA: Patent without evidence of aneurysm, dissection, vasculitis or significant stenosis. Renals: Both renal arteries are patent without evidence of aneurysm, dissection, vasculitis, fibromuscular dysplasia or significant stenosis. IMA: Patent without evidence of aneurysm, dissection, vasculitis or significant stenosis. Inflow: Patent without evidence of aneurysm, dissection, vasculitis or significant stenosis. Veins: No obvious venous abnormality within the limitations of this arterial phase study. Review of the MIP images confirms the above findings. NON-VASCULAR Hepatobiliary: Severe diffuse low  attenuation throughout the hepatic parenchyma, indicative of a background of severe hepatic steatosis. No discrete cystic or solid hepatic lesions are confidently identified on today's examination. Unenhanced appearance of the gallbladder is unremarkable. Pancreas: Large amount of peripancreatic fluid and inflammatory changes concerning for an acute pancreatitis. No discrete pancreatic mass. No pancreatic ductal dilatation. No well organized peripancreatic fluid collections. Spleen: Unremarkable. Adrenals/Urinary Tract: Bilateral kidneys and adrenal glands are normal in appearance. No hydroureteronephrosis. Urinary bladder is unremarkable in appearance. Stomach/Bowel: The appearance of the stomach is unremarkable. Inflammatory changes are noted surrounding the duodenum, likely secondary to adjacent pancreatitis. No pathologic dilatation of small bowel or colon. A few scattered colonic diverticuli are noted, without definite focal surrounding inflammatory changes to indicate an acute diverticulitis at this time. The appendix is unremarkable. Lymphatic: No lymphadenopathy noted in the abdomen or pelvis. Reproductive: Prostate gland and seminal vesicles are unremarkable in appearance. Other: Small volume of ascites. Retroperitoneal fluid centered around the pancreas. No pneumoperitoneum. Musculoskeletal: There are no aggressive appearing lytic or blastic lesions noted in the visualized portions of the skeleton. Review of the MIP images confirms the above findings. IMPRESSION: 1. No findings to suggest acute aortic syndrome. 2. Evidence of acute pancreatitis, as above. 3. Aortic atherosclerosis, in addition to left main and left anterior descending coronary artery disease. Please note that although the presence of coronary artery calcium documents the presence of coronary artery disease, the severity of this disease and any potential stenosis cannot be  assessed on this non-gated CT examination. Assessment for potential  risk factor modification, dietary therapy or pharmacologic therapy may be warranted, if clinically indicated. 4. Severe hepatic steatosis. 5. Small volume of ascites. 6. Moderate hiatal hernia. 7. Additional incidental findings, as above. Electronically Signed   By: Trudie Reed M.D.   On: 07/11/2022 06:02    Pending Labs Unresulted Labs (From admission, onward)     Start     Ordered   07/12/22 0500  Comprehensive metabolic panel  Tomorrow morning,   R        07/11/22 0803   07/12/22 0500  CBC  Tomorrow morning,   R        07/11/22 0803   07/12/22 0500  Magnesium  Tomorrow morning,   R        07/11/22 0847   07/11/22 1500  Magnesium  Once,   R        07/11/22 0843   07/11/22 1500  Basic metabolic panel  Once,   R        07/11/22 0843   07/11/22 1019  Hepatitis panel, acute  Add-on,   AD        07/11/22 1018   07/11/22 1000  Basic metabolic panel  Once,   R        07/11/22 0808   07/11/22 0955  Lipid panel  Add-on,   AD        07/11/22 0954   07/11/22 0900  ABO/Rh  Once,   R        07/11/22 0900   07/11/22 0851  Lactic acid, plasma  STAT Now then every 3 hours,   R (with STAT occurrences)      07/11/22 0851   07/11/22 0801  HIV Antibody (routine testing w rflx)  (HIV Antibody (Routine testing w reflex) panel)  Once,   R        07/11/22 0803            Vitals/Pain Today's Vitals   07/11/22 1030 07/11/22 1045 07/11/22 1100 07/11/22 1220  BP: (!) 155/95 (!) 133/99 (!) 133/97   Pulse: (!) 59 64 70   Resp: 20 19 (!) 21   Temp:    98 F (36.7 C)  TempSrc:      SpO2: 100% 100% 100%   PainSc:        Isolation Precautions No active isolations  Medications Medications  LORazepam (ATIVAN) tablet 1-4 mg (1 mg Oral Given 07/11/22 1147)  thiamine (VITAMIN B1) tablet 100 mg (100 mg Oral Given 07/11/22 1147)    Or  thiamine (VITAMIN B1) injection 100 mg ( Intravenous See Alternative 07/11/22 1147)  folic acid (FOLVITE) tablet 1 mg (1 mg Oral Given 07/11/22 1148)  multivitamin  with minerals tablet 1 tablet (1 tablet Oral Given 07/11/22 1147)  potassium chloride 10 mEq in 100 mL IVPB (10 mEq Intravenous New Bag/Given 07/11/22 1147)  enoxaparin (LOVENOX) injection 40 mg (has no administration in time range)  polyethylene glycol (MIRALAX / GLYCOLAX) packet 17 g (has no administration in time range)  lactated ringers infusion ( Intravenous New Bag/Given 07/11/22 0858)  LORazepam (ATIVAN) injection 0.5 mg (0.5 mg Intravenous Given 07/11/22 1042)  HYDROmorphone (DILAUDID) injection 1 mg (has no administration in time range)  sodium chloride 0.9 % bolus 1,000 mL (0 mLs Intravenous Stopped 07/11/22 0524)    Followed by  sodium chloride 0.9 % bolus 1,000 mL (0 mLs Intravenous Stopped 07/11/22 0524)  fentaNYL (SUBLIMAZE) injection 25 mcg (  25 mcg Intravenous Given 07/11/22 0443)  sodium chloride 0.9 % bolus 1,000 mL (0 mLs Intravenous Stopped 07/11/22 0722)  ondansetron (ZOFRAN) injection 4 mg (4 mg Intravenous Given 07/11/22 0520)  iohexol (OMNIPAQUE) 350 MG/ML injection 100 mL (100 mLs Intravenous Contrast Given 07/11/22 0541)  magnesium sulfate IVPB 2 g 50 mL (0 g Intravenous Stopped 07/11/22 0759)  fentaNYL (SUBLIMAZE) injection 100 mcg (100 mcg Intravenous Given 07/11/22 0634)  metoCLOPramide (REGLAN) injection 10 mg (10 mg Intravenous Given 07/11/22 2956)    Mobility walks     Focused Assessments    R Recommendations: See Admitting Provider Note  Report given to:   Additional Notes:

## 2022-07-11 NOTE — Assessment & Plan Note (Addendum)
Evidence of severe hepatic steatosis incidentally found on CT abdomen/pelvis.  Patient at risk for liver pathology given elevated liver enzymes in the setting of chronic heavy alcoholism and also found to have small volume of ascites on imaging as well. - Continue to monitor for further development of ascites - Following up with PCP

## 2022-07-11 NOTE — Assessment & Plan Note (Addendum)
Lactic acid 8.5 on admission, patient received 3 L of NS.  Repeat lactic acid improving at 3.2.  Reassured by improvement, less likely GI necrosis. - IVF with LR at 250 mL/h

## 2022-07-11 NOTE — Assessment & Plan Note (Signed)
>>  ASSESSMENT AND PLAN FOR HYPOKALEMIA WRITTEN ON 07/11/2022  8:43 AM BY LILLAND, ALANA, DO  Significant hypokalemia in the setting of acute pancreatitis with significant vomiting, likely due to GI loss.  Patient currently receiving 6 rounds of IV potassium, concerned that he will not tolerate additional oral supplementation at this time. - BMP at 10 AM and at 1500 - Consider every 4 BMP if persistently hypokalemic - A.m. CMP also ordered

## 2022-07-11 NOTE — Assessment & Plan Note (Addendum)
Significant hypokalemia in the setting of acute pancreatitis with significant vomiting, likely due to GI loss.  Patient currently receiving 6 rounds of IV potassium, concerned that he will not tolerate additional oral supplementation at this time. - BMP at 10 AM and at 1500 - Consider every 4 BMP if persistently hypokalemic - A.m. CMP also ordered

## 2022-07-11 NOTE — Assessment & Plan Note (Addendum)
Nausea and vomiting improved.  Vitals much improved, though he is tachycardic, which could be due to alcohol withdrawal.  CT abdomen/pelvis on admission showed signs consistent with acute pancreatitis.  Lipid panel without hypertriglyceridemia.  Right upper quadrant ultrasound with fatty liver but otherwise normal.  Continuing with aggressive fluid resuscitation as well as pain control. - Cardiac telemetry  - Vitals per routine - LR @250mL /h after GI consultation, appreciate recs - Fentanyl q2h PRN for severe pain - Miralax daily PRN for bowel regimen - Current diet of ice chips and sips with meds, will advance as able - Given prolonged QT on EKG, will try to treat nausea with the Ativan that is per CIWA protocol

## 2022-07-11 NOTE — Assessment & Plan Note (Addendum)
Patient drinks 8oz of gin anywhere from 5-10 times daily, last drink was last night. Patient already displaying signs of some withdrawal with tremors in the room.  Patient's picture will be difficult to treat given the significant pain and metabolic derangements with current acute pancreatitis as well as elevated liver enzymes.  Will need to ensure that we do not overmedicate patient while trying to treat both withdrawal and pain. - CIWA protocol - Ativan 1 - 4 mg every hour as needed, closely monitor scoring - Scheduled Ativan 0.5mg TID, increase as necessary - Monitor BMPs - Cardiac telemetry - A.m. CMP - Thiamine 100 mg daily - Folic acid 1 mg daily 

## 2022-07-11 NOTE — Assessment & Plan Note (Addendum)
Noted on CT abdomen/pelvis. Not previously diagnosed. Low likelihood of SBP given that patient doesn't have white count or signs of bacterial infection, though we will need to get a sample when patient can tolerate. -Plan to repeat right upper quadrant ultrasound when clinically improved with tapping of fluid if possible

## 2022-07-12 DIAGNOSIS — K853 Drug induced acute pancreatitis without necrosis or infection: Secondary | ICD-10-CM

## 2022-07-12 LAB — CBC
HCT: 31.1 % — ABNORMAL LOW (ref 39.0–52.0)
Hemoglobin: 10.7 g/dL — ABNORMAL LOW (ref 13.0–17.0)
MCH: 35.7 pg — ABNORMAL HIGH (ref 26.0–34.0)
MCHC: 34.4 g/dL (ref 30.0–36.0)
MCV: 103.7 fL — ABNORMAL HIGH (ref 80.0–100.0)
Platelets: 172 10*3/uL (ref 150–400)
RBC: 3 MIL/uL — ABNORMAL LOW (ref 4.22–5.81)
RDW: 12.5 % (ref 11.5–15.5)
WBC: 3.5 10*3/uL — ABNORMAL LOW (ref 4.0–10.5)
nRBC: 2 % — ABNORMAL HIGH (ref 0.0–0.2)

## 2022-07-12 LAB — COMPREHENSIVE METABOLIC PANEL
ALT: 56 U/L — ABNORMAL HIGH (ref 0–44)
AST: 125 U/L — ABNORMAL HIGH (ref 15–41)
Albumin: 2.8 g/dL — ABNORMAL LOW (ref 3.5–5.0)
Alkaline Phosphatase: 63 U/L (ref 38–126)
Anion gap: 14 (ref 5–15)
BUN: 7 mg/dL (ref 6–20)
CO2: 22 mmol/L (ref 22–32)
Calcium: 6 mg/dL — CL (ref 8.9–10.3)
Chloride: 102 mmol/L (ref 98–111)
Creatinine, Ser: 0.91 mg/dL (ref 0.61–1.24)
GFR, Estimated: >60 mL/min (ref >60)
Glucose, Bld: 138 mg/dL — ABNORMAL HIGH (ref 70–99)
Potassium: 3.4 mmol/L — ABNORMAL LOW (ref 3.5–5.1)
Sodium: 138 mmol/L (ref 135–145)
Total Bilirubin: 2.8 mg/dL — ABNORMAL HIGH (ref 0.3–1.2)
Total Protein: 5.1 g/dL — ABNORMAL LOW (ref 6.5–8.1)

## 2022-07-12 LAB — MAGNESIUM: Magnesium: 1.6 mg/dL — ABNORMAL LOW (ref 1.7–2.4)

## 2022-07-12 MED ORDER — LOSARTAN POTASSIUM 50 MG PO TABS
50.0000 mg | ORAL_TABLET | Freq: Every day | ORAL | Status: DC
  Administered 2022-07-12: 50 mg via ORAL
  Filled 2022-07-12: qty 1

## 2022-07-12 MED ORDER — CALCIUM GLUCONATE-NACL 2-0.675 GM/100ML-% IV SOLN
2.0000 g | Freq: Once | INTRAVENOUS | Status: AC
  Administered 2022-07-12: 2000 mg via INTRAVENOUS
  Filled 2022-07-12: qty 100

## 2022-07-12 MED ORDER — LORAZEPAM 1 MG PO TABS
0.5000 mg | ORAL_TABLET | Freq: Three times a day (TID) | ORAL | Status: DC
  Administered 2022-07-12 – 2022-07-14 (×4): 0.5 mg via ORAL
  Filled 2022-07-12 (×4): qty 1

## 2022-07-12 MED ORDER — POTASSIUM CHLORIDE 10 MEQ/100ML IV SOLN
10.0000 meq | INTRAVENOUS | Status: AC
  Administered 2022-07-12 (×4): 10 meq via INTRAVENOUS
  Filled 2022-07-12 (×3): qty 100

## 2022-07-12 MED ORDER — MAGNESIUM SULFATE 2 GM/50ML IV SOLN
2.0000 g | Freq: Once | INTRAVENOUS | Status: AC
  Administered 2022-07-12: 2 g via INTRAVENOUS
  Filled 2022-07-12: qty 50

## 2022-07-12 NOTE — Progress Notes (Addendum)
FMTS Interim Progress Note  S:In to see patient with Dr. Laroy Apple. Patient says he "has been better". Endorses that he has had some appetite today, but still has significant abdominal pain.  O: BP (!) 142/95 (BP Location: Right Arm)   Pulse (!) 121   Temp 98.5 F (36.9 C) (Oral)   Resp 20   Ht 5\' 7"  (1.702 m)   Wt 67 kg   SpO2 (!) 85%   BMI 23.13 kg/m   General: NAD, ill appearing Neuro: A&Ox3, decreased hand tremor Cardiovascular: RRR, no murmurs, no peripheral edema Respiratory: normal WOB on RA, CTAB, no wheezes, ronchi or rales Abdomen: soft, diffusely TTP, no rebound or guarding Extremities: Moving all 4 extremities equally   A/P: Acute pancreatitis Stable, improving. Good sign that patient's appetite is returning.  -Dilaudid 1mg  q2h prn -LR 150 ml/hr  Alcohol withdrawal Vital signs consistent with withdrawal.  CIWA scores fluctuating.  No signs of seizure.  Continue Ativan as below. -CIWA -Continue scheduled and prn Ativan  Remainder of plan per day team.  Labs and orders reviewed.  Celine Mans, MD 07/12/2022, 8:58 PM PGY-1, [facility] pager [phone]

## 2022-07-12 NOTE — Hospital Course (Addendum)
Alexander [name] is a 42 y.o. male who was admitted to the [facility] Teaching Service at [facility] for acute pancreatitis. Hospital course is outlined below by problem.   Acute pancreatitis with alcoholic hepatitis Printed with multiple hours of nausea, vomiting, abdominal pain.  CTAP with acute pancreatitis.  Vitals with some hypotension, CCM was consulted who stated stable for floor.  Lipid panel without hypertriglyceridemia.  Did exhibit lactic acidosis that improved with fluids.  He was placed on aggressive fluid resuscitation.  Pain controlled with fentanyl.  Due to prolonged QTc on EKG, was treated with Ativan for nausea as below.  Diet was progressed as tolerated.  On 6/15, patient's oxygen requirements increased, likely due to volume overload from IV fluids (see below).  By return to FMTS, patient's abdominal pain persisted.  Repeat CT abdomen pelvis with necrotic pancreas, increased perihepatic fluid. GI saw who recommended fluids and continuing antibiotics (Augmentin which he was on for pneumonia>Zosyn given increase in WBCs, last dose 6/23). Discussion around placing cortrak were had though patient was able to increase PO intake. He was transitioned to lidocaine patches and oral perocet for pain control.  By discharge, was tolerating PO and pain well controlled with percocet.   Acute hypoxemic respiratory failure CCM was called for increasing O2 requirements in setting of volume overload as above. He was placed on BiPAP and transferred to ICU.  He was able to come off of BiPAP and was ambulating through the halls by 6/18. CXR at that time with increased basilar predominant pulmonary opacities from prior with persistent bilateral pleural effusions. He was transferred back to FMTS on 6/19 on RA.  Alcoholic hepatitis and hepatic steatosis with ascites Evidence of severe hepatic steatosis with newly diagnosed ascites.  Reassured against SBP given no white count or signs of infection.  LFTs consistent  with alcoholic hepatitis; he was placed on prednisolone taper (40mg  x28 days then decrease by 10mg  weekly until off). Hepatitis panel with hepatitis B core antibody IgM; hepatitis B surface antibody was consistent with immunity.  CTAP read on 6/19 with questionable hepatic vein thrombosis; this was read again by heme-onc who determined this was more due to an occlusion from inflammation versus thrombosis.  Due to steroid taper with hx of HIV, pt sent home with bactrim for PJP ppx.  Alcohol withdrawal Significant alcohol use history.  Presented with tremors in the room.  Also had elevated liver enzymes.  He was placed on CIWA protocol with improvement in symptoms.  He was also given thiamine and folic acid.  Social work was consulted for substance use education.  On 6/15, along with fluid overload as above, patient CIWA increased to 39 and placed on Precedex in ICU.  He was able to wean off Precedex quickly and be placed on phenobarbital taper with prn low-dose benzodiazepine.  He returned to FMTS on 6/19. He remained stable over admission though was felt to potentially have some mental status changed related to alcohol use. At discharge, he was given gabapentin and instructed to follow up with new PCP.  Macrocytic anemia Hemoglobin 10.3 and MCV 101.3 on admission.  Most likely due to liver pathology.  Iron studies consistent with IDA. Started on iron supplementation.  Hypokalemia and hypomagnesemia Likely in the setting of GI losses versus decreased p.o. intake.  Magnesium 1.1, potassium 2.3.  They were repleted as needed, and at discharge Mg 1.7, K 4.3.  Hypertension Patient with DBP's in 100s over multiple checks on admission.  Most likely due to  pain.  Started on amlodipine in ICU. BP remained high over admission, and losartan was added. By discharge, sent home with amlodipine 5mg  daily and losartan 25mg  daily.  Issues for follow up: Recheck CBC, CMP, magnesium Assess substance  cessation Evaluate pain control Ascites follow-up Follow up BP control on losartan and amlodipine Ensure completion of steroid taper with bactrim PJP ppx

## 2022-07-12 NOTE — Progress Notes (Signed)
Daily Progress Note Intern Pager: [phone]  Patient name: Alexander [name] Medical record number: [id number] Date of birth: 03/01/1980 Age: 42 y.o. Gender: male  Primary Care Provider: Patient, No Pcp Per Consultants: CCM in ER, GI Code Status: Full  Pt Overview and Major Events to Date:  6/13-admitted  Assessment and Plan:  Alexander [name] is a 42 y.o. male presenting with abdominal pain with nausea and vomiting likely due to acute pancreatitis in setting of alcohol use.  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  Hospital Problem List      Hospital     * (Principal) Acute pancreatitis     Nausea and vomiting improved.  Vitals much improved, though he is  tachycardic, which could be due to alcohol withdrawal.  CT abdomen/pelvis  on admission showed signs consistent with acute pancreatitis.  Lipid panel  without hypertriglyceridemia.  Right upper quadrant ultrasound with fatty  liver but otherwise normal.  Continuing with aggressive fluid  resuscitation as well as pain control. - Cardiac telemetry  - Vitals per routine - LR @250mL /h after GI consultation, appreciate recs - Fentanyl q2h PRN for severe pain - Miralax daily PRN for bowel regimen - Current diet of ice chips and sips with meds, will advance as able - Given prolonged QT on EKG, will try to treat nausea with the Ativan that  is per CIWA protocol        Alcohol withdrawal (HCC)     Patient drinks 8oz of gin anywhere from 5-10 times daily, last drink  was 2 nights ago.  CIWA's have been mildly elevated, and he appears to be  withdrawing on exam. - CIWA protocol - Ativan 1 - 4 mg every hour as needed, closely monitor scoring - Scheduled Ativan 0.5mg  TID, increase as necessary - Monitor BMPs - Cardiac telemetry - A.m. CMP - Thiamine 100 mg daily - Folic acid 1 mg daily        Hypokalemia     Significant hypokalemia in the setting of acute pancreatitis with   significant vomiting, likely due to GI loss.  Potassium 3.4 this morning  after multiple rounds of supplementation. - Continue supplementation - A.m. CMP        Hypomagnesemia     Mag 1.1 on admission, patient received 2 g.  Likely in the setting of  GI losses.  Now 1.6 this a.m. - Magnesium supplementation as appropriate - Replete as appropriate        Acute lactic acidosis     Lactic acid 8.5 on admission, patient received 3 L of NS.  Repeat  lactic acid improving at 3.2.  Reassured by improvement, less likely GI  necrosis. - IVF with LR at 250 mL/h        Anemia     Incidental finding on admission to 12.2>10.3, now improved slightly at  10.7 this a.m. Previous check last year was 14.  Hemoccult done in the ER  that was negative.  Do not feel that there is an acute bleeding source at  this time, though we will continue to monitor if patient develops signs of  GI bleed.  Consider some element of dilution. - Continue to monitor - Consider iron studies when not acutely ill        Prolonged QT interval     Likely secondary to electrolyte abnormalities from recent GI vomiting,  but will need to closely monitor.  Will try to avoid QTc prolonging  medications as we are able while still adequately treating the patient.   Vitals overall stable in setting of alcohol withdrawal.        Hepatic steatosis     Evidence of severe hepatic steatosis incidentally found on CT  abdomen/pelvis.  Right upper quadrant sound with confirmatory findings.   Patient at risk for liver pathology given elevated liver enzymes in the  setting of chronic heavy alcoholism and also found to have small volume of  ascites on imaging as well.  Hepatitis panel was positive for hepatitis B  core IgM, HCV antibody negative.  Hepatitis B surface antibody pending. - Continue to monitor for further development of ascites - Following up with PCP - Follow-up hepatitis B surface antibody        History of  sexual behavior with high risk of exposure to communicable  disease     Patient previously sexually active with men and treated in past years  for STD infections.  Patient has not been sexually active in the recent  months.  Prescription of Descovy is listed under historical medications,  patient reports that he does not take any medications.  Likely used as  preexposure prophylaxis at that time.  HIV testing on admission negative.        Ascites     Noted on CT abdomen/pelvis. Not previously diagnosed. Low likelihood of  SBP given that patient doesn't have white count or signs of bacterial  infection, though we will need to get a sample when patient can tolerate.        Alcohol use     Hypocalcemia     Calcium 6.0 on BMP this a.m., corrected to 7.  Could be due to  increased GI losses/decreased intake.  He received calcium gluconate this  a.m. 2 g. -A.m. BMP -Repletion as appropriate       FEN/GI: Advance to clear liquid diet as tolerated PPx: Lovenox Dispo: Pending clinical improvement  Subjective:  Remains in a lot of abdominal pain this morning.  He feels as if the Ativan is helping him with his withdrawal, however.  He has never withdrawn before.  He feels like he may be able to take a little bit of p.o. today, as he has been doing well with ice chips.  Objective: Temp:  [97.6 F (36.4 C)-98.2 F (36.8 C)] 98 F (36.7 C) (06/14 0313) Pulse Rate:  [57-120] 110 (06/14 0600) Resp:  [17-21] 20 (06/14 0313) BP: (92-155)/(79-106) 110/79 (06/14 0313) SpO2:  [93 %-100 %] 100 % (06/14 0313) Weight:  [67 kg] 67 kg (06/13 1429) Physical Exam: General: Lying in bed, no acute distress Cardiovascular: Tachycardic, regular rhythm, no murmurs rubs or gallops Respiratory: Clear to auscultation bilaterally without wheezes rales or rhonchi Abdomen: Diffusely tender to palpation, nondistended, mildly firm, normoactive bowel sounds Extremities: Moves all extremities grossly equally in  bed  Laboratory: Most recent CBC Lab Results  Component Value Date   WBC 3.5 (L) 07/12/2022   HGB 10.7 (L) 07/12/2022   HCT 31.1 (L) 07/12/2022   MCV 103.7 (H) 07/12/2022   PLT 172 07/12/2022   Most recent BMP    Latest Ref Rng & Units 07/12/2022    2:17 AM  BMP  Glucose 70 - 99 mg/dL 161   BUN 6 - 20 mg/dL 7   Creatinine 0.96 - 0.45 mg/dL 4.09   Sodium 811 - 914 mmol/L 138   Potassium 3.5 - 5.1 mmol/L 3.4   Chloride 98 - 111 mmol/L 102  CO2 22 - 32 mmol/L 22   Calcium 8.9 - 10.3 mg/dL 6.0    Evette Georges, MD 07/12/2022, 7:51 AM PGY-1, [facility] FPTS Intern pager: [phone], text pages welcome Secure chat group [facility] Teaching Service

## 2022-07-12 NOTE — Assessment & Plan Note (Signed)
Significant hypokalemia in the setting of acute pancreatitis with significant vomiting, likely due to GI loss.  Potassium 3.4 this morning after multiple rounds of supplementation. - Continue supplementation - A.m. CMP

## 2022-07-12 NOTE — Assessment & Plan Note (Addendum)
Likely secondary to electrolyte abnormalities from recent GI vomiting, but will need to closely monitor.  Will try to avoid QTc prolonging medications as we are able while still adequately treating the patient.  Vitals overall stable in setting of alcohol withdrawal.

## 2022-07-12 NOTE — Plan of Care (Signed)

## 2022-07-12 NOTE — Assessment & Plan Note (Signed)
>>  ASSESSMENT AND PLAN FOR HYPOKALEMIA WRITTEN ON 07/12/2022  7:44 AM BY MABE, GERALD, MD  Significant hypokalemia in the setting of acute pancreatitis with significant vomiting, likely due to GI loss.  Potassium 3.4 this morning after multiple rounds of supplementation. - Continue supplementation - A.m. CMP

## 2022-07-12 NOTE — Assessment & Plan Note (Signed)
Patient previously sexually active with men and treated in past years for STD infections.  Patient has not been sexually active in the recent months.  Prescription of Descovy is listed under historical medications, patient reports that he does not take any medications.  Likely used as preexposure prophylaxis at that time.  HIV testing on admission negative.

## 2022-07-12 NOTE — Assessment & Plan Note (Addendum)
Evidence of severe hepatic steatosis incidentally found on CT abdomen/pelvis.  Right upper quadrant sound with confirmatory findings.  Patient at risk for liver pathology given elevated liver enzymes in the setting of chronic heavy alcoholism and also found to have small volume of ascites on imaging as well.  Hepatitis panel was positive for hepatitis B core IgM, HCV antibody negative.  Hepatitis B surface antibody pending. - Continue to monitor for further development of ascites - Following up with PCP - Follow-up hepatitis B surface antibody

## 2022-07-12 NOTE — Assessment & Plan Note (Signed)
>>  ASSESSMENT AND PLAN FOR HYPOCALCEMIA WRITTEN ON 07/12/2022  7:51 AM BY MABE, GERALD, MD  Calcium 6.0 on BMP this a.m., corrected to 7.  Could be due to increased GI losses/decreased intake.  He received calcium gluconate this a.m. 2 g. -A.m. BMP -Repletion as appropriate

## 2022-07-12 NOTE — Assessment & Plan Note (Signed)
Mag 1.1 on admission, patient received 2 g.  Likely in the setting of GI losses.  Now 1.6 this a.m. - Magnesium supplementation as appropriate - Replete as appropriate

## 2022-07-12 NOTE — Assessment & Plan Note (Signed)
Calcium 6.0 on BMP this a.m., corrected to 7.  Could be due to increased GI losses/decreased intake.  He received calcium gluconate this a.m. 2 g. -A.m. BMP -Repletion as appropriate

## 2022-07-12 NOTE — Assessment & Plan Note (Addendum)
Incidental finding on admission to 12.2>10.3, now improved slightly at 10.7 this a.m. Previous check last year was 14.  Hemoccult done in the ER that was negative.  Do not feel that there is an acute bleeding source at this time, though we will continue to monitor if patient develops signs of GI bleed.  Consider some element of dilution. - Continue to monitor - Consider iron studies when not acutely ill

## 2022-07-12 NOTE — Assessment & Plan Note (Signed)
>>  ASSESSMENT AND PLAN FOR HYPOMAGNESEMIA WRITTEN ON 07/12/2022  7:45 AM BY MABE, GERALD, MD  Mag 1.1 on admission, patient received 2 g.  Likely in the setting of GI losses.  Now 1.6 this a.m. - Magnesium supplementation as appropriate - Replete as appropriate

## 2022-07-13 ENCOUNTER — Inpatient Hospital Stay (HOSPITAL_COMMUNITY): Payer: Medicaid Other

## 2022-07-13 DIAGNOSIS — J9601 Acute respiratory failure with hypoxia: Secondary | ICD-10-CM

## 2022-07-13 DIAGNOSIS — K852 Alcohol induced acute pancreatitis without necrosis or infection: Secondary | ICD-10-CM

## 2022-07-13 DIAGNOSIS — E876 Hypokalemia: Secondary | ICD-10-CM | POA: Insufficient documentation

## 2022-07-13 LAB — COMPREHENSIVE METABOLIC PANEL
ALT: 50 U/L — ABNORMAL HIGH (ref 0–44)
ALT: 54 U/L — ABNORMAL HIGH (ref 0–44)
AST: 133 U/L — ABNORMAL HIGH (ref 15–41)
AST: 146 U/L — ABNORMAL HIGH (ref 15–41)
Albumin: 2.3 g/dL — ABNORMAL LOW (ref 3.5–5.0)
Albumin: 2.3 g/dL — ABNORMAL LOW (ref 3.5–5.0)
Alkaline Phosphatase: 81 U/L (ref 38–126)
Alkaline Phosphatase: 101 U/L (ref 38–126)
Anion gap: 11 (ref 5–15)
Anion gap: 11 (ref 5–15)
BUN: 11 mg/dL (ref 6–20)
BUN: 12 mg/dL (ref 6–20)
CO2: 20 mmol/L — ABNORMAL LOW (ref 22–32)
CO2: 20 mmol/L — ABNORMAL LOW (ref 22–32)
Calcium: 5.2 mg/dL — CL (ref 8.9–10.3)
Calcium: 5.7 mg/dL — CL (ref 8.9–10.3)
Chloride: 102 mmol/L (ref 98–111)
Chloride: 102 mmol/L (ref 98–111)
Creatinine, Ser: 1.11 mg/dL (ref 0.61–1.24)
Creatinine, Ser: 1.05 mg/dL (ref 0.61–1.24)
GFR, Estimated: >60 mL/min (ref >60)
GFR, Estimated: >60 mL/min (ref >60)
Glucose, Bld: 132 mg/dL — ABNORMAL HIGH (ref 70–99)
Glucose, Bld: 165 mg/dL — ABNORMAL HIGH (ref 70–99)
Potassium: 3.7 mmol/L (ref 3.5–5.1)
Potassium: 3.5 mmol/L (ref 3.5–5.1)
Sodium: 133 mmol/L — ABNORMAL LOW (ref 135–145)
Sodium: 133 mmol/L — ABNORMAL LOW (ref 135–145)
Total Bilirubin: 2.8 mg/dL — ABNORMAL HIGH (ref 0.3–1.2)
Total Bilirubin: 2.3 mg/dL — ABNORMAL HIGH (ref 0.3–1.2)
Total Protein: 5.3 g/dL — ABNORMAL LOW (ref 6.5–8.1)
Total Protein: 5.3 g/dL — ABNORMAL LOW (ref 6.5–8.1)

## 2022-07-13 LAB — BASIC METABOLIC PANEL
Anion gap: 14 (ref 5–15)
BUN: 12 mg/dL (ref 6–20)
CO2: 20 mmol/L — ABNORMAL LOW (ref 22–32)
Calcium: 4.6 mg/dL — CL (ref 8.9–10.3)
Chloride: 100 mmol/L (ref 98–111)
Creatinine, Ser: 1.03 mg/dL (ref 0.61–1.24)
GFR, Estimated: >60 mL/min (ref >60)
Glucose, Bld: 135 mg/dL — ABNORMAL HIGH (ref 70–99)
Potassium: 3.7 mmol/L (ref 3.5–5.1)
Sodium: 134 mmol/L — ABNORMAL LOW (ref 135–145)

## 2022-07-13 LAB — CBC
HCT: 32.7 % — ABNORMAL LOW (ref 39.0–52.0)
Hemoglobin: 10.8 g/dL — ABNORMAL LOW (ref 13.0–17.0)
MCH: 34.4 pg — ABNORMAL HIGH (ref 26.0–34.0)
MCHC: 33 g/dL (ref 30.0–36.0)
MCV: 104.1 fL — ABNORMAL HIGH (ref 80.0–100.0)
Platelets: 153 10*3/uL (ref 150–400)
RBC: 3.14 MIL/uL — ABNORMAL LOW (ref 4.22–5.81)
RDW: 12.7 % (ref 11.5–15.5)
WBC: 6.7 10*3/uL (ref 4.0–10.5)
nRBC: 0.4 % — ABNORMAL HIGH (ref 0.0–0.2)

## 2022-07-13 LAB — MAGNESIUM
Magnesium: 1.7 mg/dL (ref 1.7–2.4)
Magnesium: 2 mg/dL (ref 1.7–2.4)
Magnesium: 1.9 mg/dL (ref 1.7–2.4)

## 2022-07-13 LAB — BLOOD GAS, VENOUS
Acid-base deficit: 5.6 mmol/L — ABNORMAL HIGH (ref 0.0–2.0)
Bicarbonate: 21.1 mmol/L (ref 20.0–28.0)
Drawn by: 1470
O2 Saturation: 48.9 %
Patient temperature: 37.1
pCO2, Ven: 45 mmHg (ref 44–60)
pH, Ven: 7.28 (ref 7.25–7.43)
pO2, Ven: 32 mmHg (ref 32–45)

## 2022-07-13 LAB — HEPATIC FUNCTION PANEL
ALT: 51 U/L — ABNORMAL HIGH (ref 0–44)
AST: 134 U/L — ABNORMAL HIGH (ref 15–41)
Albumin: 2.4 g/dL — ABNORMAL LOW (ref 3.5–5.0)
Alkaline Phosphatase: 69 U/L (ref 38–126)
Bilirubin, Direct: 1 mg/dL — ABNORMAL HIGH (ref 0.0–0.2)
Indirect Bilirubin: 1.5 mg/dL — ABNORMAL HIGH (ref 0.3–0.9)
Total Bilirubin: 2.5 mg/dL — ABNORMAL HIGH (ref 0.3–1.2)
Total Protein: 5.2 g/dL — ABNORMAL LOW (ref 6.5–8.1)

## 2022-07-13 LAB — GLUCOSE, CAPILLARY
Glucose-Capillary: 193 mg/dL — ABNORMAL HIGH (ref 70–99)
Glucose-Capillary: 174 mg/dL — ABNORMAL HIGH (ref 70–99)
Glucose-Capillary: 144 mg/dL — ABNORMAL HIGH (ref 70–99)

## 2022-07-13 LAB — TROPONIN I (HIGH SENSITIVITY)
Troponin I (High Sensitivity): 23 ng/L — ABNORMAL HIGH (ref <18)
Troponin I (High Sensitivity): 37 ng/L — ABNORMAL HIGH (ref <18)
Troponin I (High Sensitivity): 34 ng/L — ABNORMAL HIGH (ref <18)
Troponin I (High Sensitivity): 25 ng/L — ABNORMAL HIGH (ref <18)

## 2022-07-13 LAB — BRAIN NATRIURETIC PEPTIDE: B Natriuretic Peptide: 86.6 pg/mL (ref 0.0–100.0)

## 2022-07-13 LAB — HEPATITIS B SURFACE ANTIBODY, QUANTITATIVE: Hep B S AB Quant (Post): 266 m[IU]/mL (ref >9.9)

## 2022-07-13 LAB — PROCALCITONIN: Procalcitonin: 0.91 ng/mL

## 2022-07-13 MED ORDER — POLYETHYLENE GLYCOL 3350 17 G PO PACK
17.0000 g | PACK | Freq: Every day | ORAL | Status: DC
  Administered 2022-07-13 – 2022-07-17 (×5): 17 g via ORAL
  Filled 2022-07-13 (×5): qty 1

## 2022-07-13 MED ORDER — FUROSEMIDE 10 MG/ML IJ SOLN
60.0000 mg | Freq: Once | INTRAMUSCULAR | Status: AC
  Administered 2022-07-13: 60 mg via INTRAVENOUS
  Filled 2022-07-13: qty 6

## 2022-07-13 MED ORDER — POTASSIUM CHLORIDE 10 MEQ/100ML IV SOLN
10.0000 meq | INTRAVENOUS | Status: AC
  Administered 2022-07-13 – 2022-07-14 (×4): 10 meq via INTRAVENOUS
  Filled 2022-07-13 (×4): qty 100

## 2022-07-13 MED ORDER — FUROSEMIDE 10 MG/ML IJ SOLN
40.0000 mg | Freq: Once | INTRAMUSCULAR | Status: AC
  Administered 2022-07-13: 40 mg via INTRAVENOUS
  Filled 2022-07-13: qty 4

## 2022-07-13 MED ORDER — NICOTINE 7 MG/24HR TD PT24
7.0000 mg | MEDICATED_PATCH | Freq: Every day | TRANSDERMAL | Status: DC
  Administered 2022-07-13 – 2022-07-23 (×11): 7 mg via TRANSDERMAL
  Filled 2022-07-13 (×11): qty 1

## 2022-07-13 MED ORDER — MAGNESIUM SULFATE 2 GM/50ML IV SOLN
2.0000 g | Freq: Once | INTRAVENOUS | Status: AC
  Administered 2022-07-14: 2 g via INTRAVENOUS
  Filled 2022-07-13: qty 50

## 2022-07-13 MED ORDER — SODIUM CHLORIDE 0.9 % IV SOLN: INTRAVENOUS | Status: DC | PRN

## 2022-07-13 MED ORDER — SODIUM CHLORIDE 0.9 % IV SOLN
4.0000 g | Freq: Once | INTRAVENOUS | Status: AC
  Administered 2022-07-13: 4 g via INTRAVENOUS
  Filled 2022-07-13: qty 40

## 2022-07-13 MED ORDER — ONDANSETRON HCL 4 MG/2ML IJ SOLN
4.0000 mg | Freq: Once | INTRAMUSCULAR | Status: AC
  Administered 2022-07-13: 4 mg via INTRAVENOUS
  Filled 2022-07-13: qty 2

## 2022-07-13 MED ORDER — PANTOPRAZOLE SODIUM 40 MG IV SOLR
40.0000 mg | INTRAVENOUS | Status: DC
  Administered 2022-07-13 – 2022-07-15 (×3): 40 mg via INTRAVENOUS
  Filled 2022-07-13 (×3): qty 10

## 2022-07-13 MED ORDER — MAGNESIUM SULFATE 2 GM/50ML IV SOLN
2.0000 g | Freq: Once | INTRAVENOUS | Status: AC
  Administered 2022-07-13: 2 g via INTRAVENOUS
  Filled 2022-07-13: qty 50

## 2022-07-13 MED ORDER — SODIUM CHLORIDE 0.9 % IV SOLN
3.0000 g | Freq: Four times a day (QID) | INTRAVENOUS | Status: DC
  Administered 2022-07-14 – 2022-07-16 (×6): 3 g via INTRAVENOUS
  Filled 2022-07-13 (×7): qty 8

## 2022-07-13 MED ORDER — FOLIC ACID 5 MG/ML IJ SOLN
1.0000 mg | Freq: Every day | INTRAMUSCULAR | Status: DC
  Administered 2022-07-13 – 2022-07-15 (×3): 1 mg via INTRAVENOUS
  Filled 2022-07-13 (×5): qty 0.2

## 2022-07-13 MED ORDER — INSULIN ASPART 100 UNIT/ML IJ SOLN
1.0000 [IU] | INTRAMUSCULAR | Status: DC
  Administered 2022-07-14: 1 [IU] via SUBCUTANEOUS
  Administered 2022-07-14: 2 [IU] via SUBCUTANEOUS
  Administered 2022-07-15 (×2): 1 [IU] via SUBCUTANEOUS
  Administered 2022-07-15: 2 [IU] via SUBCUTANEOUS
  Administered 2022-07-15 – 2022-07-16 (×3): 1 [IU] via SUBCUTANEOUS

## 2022-07-13 MED ORDER — CHLORHEXIDINE GLUCONATE CLOTH 2 % EX PADS
6.0000 | MEDICATED_PAD | Freq: Every day | CUTANEOUS | Status: DC
  Administered 2022-07-13 – 2022-07-22 (×9): 6 via TOPICAL

## 2022-07-13 MED ORDER — SODIUM CHLORIDE 0.9 % IV SOLN
0.5000 mg/kg/h | INTRAVENOUS | Status: AC
  Administered 2022-07-13: 0.5 mg/kg/h via INTRAVENOUS
  Filled 2022-07-13: qty 100

## 2022-07-13 NOTE — Progress Notes (Signed)
Patient transferred to 39M 12 via bed on BiPAP per this RN and respiratory therapist. Report given to [facility]. Zofran administered prior to transport as pt was nauseated. CCMD notified of pt's transfer. Pt's belonging carried per pt's family who came down to the unit along with [place], awaiting in waiting area.

## 2022-07-13 NOTE — Progress Notes (Addendum)
NAME:  Alexander [name], MRN:  [id number], DOB:  1980-11-07, LOS: 2 ADMISSION DATE:  07/11/2022, CONSULTATION DATE:  6/15 REFERRING MD:  Dr. Royal Piedra, CHIEF COMPLAINT:  acute pancreatitis   History of Present Illness:  Patient is a 42 year old male with pertinent PMH alcohol abuse presents to [facility] ED on 6/13 with acute pancreatitis.  Patient states on 6/12 had episodes of N/V after eating in the evening.  Throughout the night he was having continual vomiting and abdominal pain.  Patient denies any fevers.  Patient does report he is a heavy drinker.  Drinks 8 ounces of gin 5-10 times per day.  Patient came to [facility] ED on 6/13 for further workup.  Upon arrival to [facility] ED, patient still complaining of upper abdominal pain.  Patient afebrile.  Lipase 1600, LA 8.5, ethanol 156, AST 167, ALT 72.  CTA ABD/pelvis noted acute pancreatitis.  Patient started on IV fluids and given as needed fentanyl for pain.  Patient also with hypokalemia and hypomagnesemia.  Electrolytes supplemented.  Patient initially hypotensive and PCCM consulted but patient responded well to IV fluids and was admitted to progressive floors.  On 6/15, patient w/ RR in 30-40s and sats 91% on room air. Placed on [place]. CIWA score this am around 12. Calcium 4.6 which was repleted. O2 requirements continued to increase now on 6L [place]. CXR showing probable effusions and atelectasis. Concern for volume overload given patient has been receiving IVF for pancreatitis. IVF stopped and given 40 IV lasix. UOP 850 last 12 hours. VBG 7.28, 45, 32, 21. Patient still complaining of sob w/ shallow breaths and chest pressure. Patient also tachycardic 130s. EKG no ST elevation. Trop 23, 37, 34, 25. CIWA scordes now 39. Patient placed on bipap w/ some improvement in wob. PCCM consulted.   Pertinent  Medical History  Alcohol abuse Tobacco abuse Reactive arthritis  Significant Hospital Events: Including procedures, antibiotic start and stop dates in addition to other  pertinent events   6/13 patient admitted acute pancreatitis 6/15 patient with worsening SOB due to fluid overload; IV Lasix given and placed on BiPAP; patient CIWA 39 requiring increased amounts of Ativan; PCCM consulted  Interim History / Subjective:  Patient on bipap 10/5 RR in 30s States his breathing feels better w/ bipap  Objective   Blood pressure (!) 167/109, pulse 69, temperature 98.8 F (37.1 C), temperature source Oral, resp. rate (!) 34, height 5\' 7"  (1.702 m), weight 67 kg, SpO2 97 %.        Intake/Output Summary (Last 24 hours) at 07/13/2022 1904 Last data filed at 07/13/2022 1335 Gross per 24 hour  Intake 2376.25 ml  Output --  Net 2376.25 ml   Filed Weights   07/11/22 1429  Weight: 67 kg    Examination: General:   ill appearing male on bipap HEENT: MM pink/moist; bipap in place Neuro: Aox3; MAE CV: s1s2, tachy 130s, no m/r/g PULM:  dim clear BS bilaterally; on bipap w/ sats 99%; RR 30s GI: soft, bsx4 active; mild b/l lower abd tender upon palpation Extremities: warm/dry, no edema  Skin: no rashes or lesions    Resolved Hospital Problem list     Assessment & Plan:   Acute alcoholic pancreatitis  Plan: -hold on further IVF given concern for volume overload and increase wob -prn dilaudid for pain -last qtc 480 on today's EKG; will give zofran x1 for severe nausea; cont ativan per ciwa which should help w/ nausea as well  Alcohol withdrawal -last drink on 6/12;  drinks 8 oz of gin anywhere from 5-10 times daily  Plan: -CIWA protocol in place -prn ativa -consider phenobarb -Thiamine, folic acid, MVI  Acute respiratory failure w/ hypoxia Probable small right pleural effusions w/ bibasilar atelectasis/consolidation Plan: -will repeat dose of iv lasix; monitor uop; bladder scan -check bnp -cont bipap; wean o2 for sats >92% -pulm toieltry: IS -will start on unasyn for aspiration ppx -will send covid/flu/rvp    Hypocalcemia Hypokalemia Hypomagnesemia Prolonged qtc: improved w/ electrolyte correction; EKG on 6/15 qtc 480 Plan: -repeat cmp and mag later tonight post lasix -Trend electrolytes and replete as needed -tele monitoring  Hepatic steatosis Mild ascites Plan: -cont to monitor for further development of ascites -Supportive care  Anemia Plan: -Trend CBC  Moderate hiatal hernia Plan: -ppi  Tobacco abuse Plan: -nicotine patch   Best Practice (right click and "Reselect all SmartList Selections" daily)   Diet/type: NPO w/ oral meds DVT prophylaxis: LMWH GI prophylaxis: PPI Lines: N/A Foley:  N/A Code Status:  full code Last date of multidisciplinary goals of care discussion [6/15 updated patient and family at bedside]  Labs   CBC: Recent Labs  Lab 07/11/22 0329 07/11/22 0401 07/12/22 0217 07/13/22 0110  WBC  --  7.8 3.5* 6.7  NEUTROABS  --  5.9  --   --   HGB 12.2* 10.3* 10.7* 10.8*  HCT 36.0* 30.2* 31.1* 32.7*  MCV  --  101.3* 103.7* 104.1*  PLT  --  232 172 153    Basic Metabolic Panel: Recent Labs  Lab 07/11/22 0401 07/11/22 1240 07/11/22 1506 07/12/22 0217 07/13/22 0110 07/13/22 1233  NA 139 142 141 138 134* 133*  K 2.5* 3.1* 3.2* 3.4* 3.7 3.7  CL 100 104 103 102 100 102  CO2 15* 18* 21* 22 20* 20*  GLUCOSE 138* 165* 180* 138* 135* 132*  BUN <5* 5* 5* 7 12 11   CREATININE 1.20 0.91 0.87 0.91 1.03 1.11  CALCIUM 8.0* 7.3* 7.0* 6.0* 4.6* 5.2*  MG 1.1*  --  1.3* 1.6* 1.7 2.0   GFR: Estimated Creatinine Clearance: 79.4 mL/min (by C-G formula based on SCr of 1.11 mg/dL). Recent Labs  Lab 07/11/22 0401 07/11/22 0622 07/11/22 1240 07/11/22 1457 07/12/22 0217 07/13/22 0110  WBC 7.8  --   --   --  3.5* 6.7  LATICACIDVEN 8.5* 7.8* 3.7* 3.2*  --   --     Liver Function Tests: Recent Labs  Lab 07/11/22 0401 07/12/22 0217 07/13/22 0110 07/13/22 1233  AST 177* 125* 134* 133*  ALT 72* 56* 51* 50*  ALKPHOS 63 63 69 81  BILITOT 1.5* 2.8*  2.5* 2.8*  PROT 5.3* 5.1* 5.2* 5.3*  ALBUMIN 2.9* 2.8* 2.4* 2.3*   Recent Labs  Lab 07/11/22 0401  LIPASE 1,737*   Recent Labs  Lab 07/11/22 0322  AMMONIA 50*    ABG    Component Value Date/Time   HCO3 21.1 07/13/2022 1033   TCO2 18 (L) 07/11/2022 0329   ACIDBASEDEF 5.6 (H) 07/13/2022 1033   O2SAT 48.9 07/13/2022 1033     Coagulation Profile: Recent Labs  Lab 07/11/22 0401  INR 1.2    Cardiac Enzymes: No results for input(s): "CKTOTAL", "CKMB", "CKMBINDEX", "TROPONINI" in the last 168 hours.  HbA1C: No results found for: "HGBA1C"  CBG: Recent Labs  Lab 07/13/22 1828  GLUCAP 193*    Review of Systems:   Patient on bipap and sob; history has been obtained from chart review.    Past Medical History:  He,  has no past medical history on file.   Surgical History:  No past surgical history on file.   Social History:   reports that he has never smoked. He has never used smokeless tobacco. He reports current alcohol use. He reports that he does not use drugs.   Family History:  His family history includes Healthy in his mother; Hypertension in his father.   Allergies No Known Allergies   Home Medications  Prior to Admission medications   Medication Sig Start Date End Date Taking? Authorizing Provider  acetaminophen (TYLENOL) 500 MG tablet Take 1 tablet (500 mg total) by mouth every 6 (six) hours as needed. Patient taking differently: Take 500 mg by mouth every 6 (six) hours as needed for mild pain. 02/07/22  Yes Rising, Rebecca, PA-C  DESCOVY 200-25 MG tablet Take 1 tablet by mouth daily. 02/22/21  Yes [provider]     Critical care time: 45 minutes    JD Daryel [facility] 07/13/2022, 7:04 PM  Please see [email or web address] for pager details.  From 7A-7P if no response, please call [phone]. After hours, please call ELink [phone].

## 2022-07-13 NOTE — Progress Notes (Signed)
FMTS Interim Progress Note  S: Afternoon evaluation given severity of pancreatitis and alcohol withdrawal.  Notes that his pain is improved however he still feels chest pressure and shortness of breath.  Denies radiating of chest pain or pressure to left arm or neck.  He has been able to drink and have some broth.  He feels as if he has to have a bowel movement and is requesting bowel regimen.  He had his nasal cannula on his forehead while he is drinking.  Endorses he has been urinating since receiving Lasix  O: BP (!) 141/106   Pulse (!) 129   Temp 98.4 F (36.9 C) (Oral)   Resp (!) 25   Ht 5\' 7"  (1.702 m)   Wt 67 kg   SpO2 99%   BMI 23.13 kg/m   General: Ill-appearing, no acute distress CV: Tachycardic, no murmurs auscultated Respiratory: Diminished breath sounds in lung bases, tachypneic, on 6 L [place] (present on forehead) Abdomen: Hypoactive bowel sounds, generalized abdominal tenderness  A/P: Acute pancreatitis Remains tachycardic, tachypneic, hypertensive. -Continue KVO LR -Dilaudid 1 mg every 2 hours as needed for severe pain -Schedule MiraLAX daily -Clear liquid diet -Low threshold for CCM referral  Shortness of breath Chest pressure continues and troponins are trending 24> 37 no signs of ST elevation on EKG.  Pulmonary exam is reassuring although he has been urinating appropriately with Lasix and is satting appropriately even with nasal cannula not being in place. -Strict I/O -Consider further dosing of Lasix -Consider BiPAP  Alcohol withdrawal CIWA's continue to be 11, just received Ativan. -Continue CIWA protocol with Ativan as needed in addition to scheduled 0.5 mg 3 times daily Ativan  Shelby Mattocks, DO 07/13/2022, 2:06 PM PGY-2, [facility] Family Medicine Service pager [phone]

## 2022-07-13 NOTE — Progress Notes (Addendum)
FMTS Interim Progress Note  S:  Red MEWS likely d/t tachycardia/acute withdrawal Messaged by nursing that patient -asking for nicotine patch  -some breathing difficulty o2 sat was 91 % in RA, but gave him 2L for comfort and felt better -New CIWA of 12, receiving ativan, seems to last around an hour per [employer] to see patient at bedside and patient was asleep, resting comfortably in bed, still tachycardic to 117's, saturating at 100% on 2 L  -Also noticed hypocalcemia critical down to 4.6  O: BP (!) 140/106 (BP Location: Right Arm)   Pulse (!) 118   Temp 98.4 F (36.9 C) (Oral)   Resp (!) 34   Ht 5\' 7"  (1.702 m)   Wt 67 kg   SpO2 100%   BMI 23.13 kg/m   General:Asleep Head: Unable to completely do full workup for hypocalcemia, no obvious [place] sign, sign CV: Tachycardic rate and rhythm no murmurs rubs or gallops Respiratory: Clear to ausculation bilaterally, chest rises symmetrically   A/P: Alcohol withdrawal CIWA has been increasing steadily.  When I saw on my exam he has seemed to respond to last dose of Ativan but will monitor closely.  If continues to have elevated CIWA's that are not controlled with Ativan low threshold to reach out to CCM to consider Precedex/escalation of care. -Discussed with primary RN to let us know if not responding well to Ativan  Hypocalcemia Calcium of 4.6-no hepatic infection panel to correct for calcium but with albumin from yesterday corrects to around 5.8.  Likely in the setting of saponification from pancreatitis.  Received 2 g yesterday.  Called and spoke with on-call pharmacist who recommended 4 g of calcium with patient is symptomatic.  Difficult to assess whether patient is symptomatic given a lot of crossover between alcohol withdrawal and hypocalcemia symptoms.  Does have weakness, fatigue, no obvious signs of contractures or tetany. -Called lab and discussed add-on hepatic function panel -4 g IV calcium gluconate  Levin Erp,  MD 07/13/2022, 2:41 AM PGY-2, [facility] Family Medicine Service pager [phone]

## 2022-07-13 NOTE — Assessment & Plan Note (Signed)
Stabilized 10.7 > 10.8.  No signs of acute GI bleeding or otherwise.  Initial drop likely related to hemodilution. - Continue to monitor - Consider iron studies when not acutely ill

## 2022-07-13 NOTE — Progress Notes (Signed)
Daily Progress Note Intern Pager: [phone]  Patient name: Alexander [name] Medical record number: [id number] Date of birth: March 13, 1980 Age: 42 y.o. Gender: male  Primary Care Provider: Patient, No Pcp Per Consultants: CCM in ER, GI (S/O) Code Status: Full  Pt Overview and Major Events to Date:  6/13: Admitted  Assessment and Plan: Alexander [name] is a 42 y.o. male who presented with abdominal pain, nausea and vomiting in the setting of acute pancreatitis and alcohol use continuing to be managed for pancreatitis and alcohol withdrawal. 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  Hospital Problem List      Hospital     * (Principal) Acute pancreatitis     Nausea and vomiting improved.  He is tachycardic, tachypneic,  hypertensive, this is a cloudy picture given combination of alcohol  withdrawal and pancreatitis. CT abdomen/pelvis on admission showed signs  consistent with acute pancreatitis.  Lipid panel without  hypertriglyceridemia.  Right upper quadrant ultrasound with fatty liver  but otherwise normal.  It is encouraging that his pain is improved  although I am concerned regarding his vital signs for volume overload and  acute decompensation which is affecting his respiratory status. - Troponin, EKG, VBG, abdominal XR - Cardiac telemetry  - Vitals per routine - KVO LR -Dilaudid 1 mg q2h PRN for severe pain - Miralax daily PRN for bowel regimen - Clear liquid diet, will advance as able - Given prolonged QT on EKG, will try to treat nausea with the Ativan that  is per CIWA protocol        Alcohol withdrawal (HCC)     Patient drinks 8oz of gin anywhere from 5-10 times daily, last drink  was 6/12 PM.  CIWA's 11> 11> 11 and he appears to be withdrawing on exam. - CIWA protocol - Ativan 1 - 4 mg every hour as needed, closely monitor scoring - Scheduled Ativan 0.5mg  TID, increase as necessary - Monitor BMPs - Cardiac telemetry - Thiamine  100 mg, Folic acid 1 mg,  MVI daily        RESOLVED: Acute lactic acidosis     Lactic acid 8.5 on admission, patient received 3 L of NS.  Repeat  lactic acid improving at 3.2.  Reassured by improvement, less likely GI  necrosis. - IVF with LR at 250 mL/h        Anemia     Stabilized 10.7 > 10.8.  No signs of acute GI bleeding or otherwise.   Initial drop likely related to hemodilution. - Continue to monitor - Consider iron studies when not acutely ill        Prolonged QT interval     Likely secondary to electrolyte abnormalities from recent GI vomiting,  but will need to closely monitor.  Will try to avoid QTc prolonging  medications as able while still adequately treating the patient.         Hepatic steatosis     Evidence of severe hepatic steatosis incidentally found on CT  abdomen/pelvis.  Right upper quadrant sound with confirmatory findings.   Patient at risk for liver pathology given elevated liver enzymes in the  setting of chronic heavy alcoholism and also found to have small volume of  ascites on imaging as well.  Hepatitis panel was positive for hepatitis B  core IgM, HCV antibody negative.  Hepatitis B surface antibody reflects  immune status. - Continue to monitor for further development of ascites  Ascites     Noted on CT abdomen/pelvis. Not previously diagnosed. Low likelihood of  SBP given that patient doesn't have white count or signs of bacterial  infection, though we will need to get a sample when patient can tolerate. -Plan to repeat right upper quadrant ultrasound when clinically improved  with tapping of fluid if possible        Electrolyte abnormality     Patient's electrolytes remain a concern in the setting of GI loss and  saponification.  K3.7, magnesium 1.7, calcium 4.6 (uncorrected).   Receiving magnesium 2 g IV and calcium 4 g IV.  Goal is normal labs. -CMP at 1500 -Continue supplementation as necessary     FEN/GI: Clear liquid  diet PPx: Lovenox Dispo: pending clinical improvement . Barriers include alcohol withdrawal and pancreatitis.   Subjective:  Notes his pain is better than when he arrived although he is still quite uncomfortable.  He endorses shortness of breath.  He was able to tolerate a tiny bit of food from his clear liquid diet yesterday.  Continues to endorse last drink on 6/12 PM  Objective: Temp:  [97.6 F (36.4 C)-98.7 F (37.1 C)] 98.4 F (36.9 C) (06/15 1053) Pulse Rate:  [106-129] 123 (06/15 1053) Resp:  [20-34] 31 (06/15 1053) BP: (120-155)/(92-119) 132/92 (06/15 1053) SpO2:  [85 %-100 %] 99 % (06/15 1053) Physical Exam: General: Distressed, ill-appearing Cardiovascular: Tachycardic, no murmurs appreciated Respiratory: CTAB, tachypneic on 2 L [place] Abdomen: Generalized abdominal tenderness, hypoactive bowel sounds Extremities: No pitting edema BLEs  Laboratory: Most recent CBC Lab Results  Component Value Date   WBC 6.7 07/13/2022   HGB 10.8 (L) 07/13/2022   HCT 32.7 (L) 07/13/2022   MCV 104.1 (H) 07/13/2022   PLT 153 07/13/2022   Most recent BMP    Latest Ref Rng & Units 07/13/2022    1:10 AM  BMP  Glucose 70 - 99 mg/dL 409   BUN 6 - 20 mg/dL 12   Creatinine 8.11 - 1.24 mg/dL 9.14   Sodium 782 - 956 mmol/L 134   Potassium 3.5 - 5.1 mmol/L 3.7   Chloride 98 - 111 mmol/L 100   CO2 22 - 32 mmol/L 20   Calcium 8.9 - 10.3 mg/dL 4.6       Latest Ref Rng & Units 07/13/2022    1:10 AM 07/12/2022    2:17 AM 07/11/2022    4:01 AM  Hepatic Function  Total Protein 6.5 - 8.1 g/dL 5.2  5.1  5.3   Albumin 3.5 - 5.0 g/dL 2.4  2.8  2.9   AST 15 - 41 U/L 134  125  177   ALT 0 - 44 U/L 51  56  72   Alk Phosphatase 38 - 126 U/L 69  63  63   Total Bilirubin 0.3 - 1.2 mg/dL 2.5  2.8  1.5   Bilirubin, Direct 0.0 - 0.2 mg/dL 1.0   0.4    Magnesium: 1.7  Imaging/Diagnostic Tests: CXR: Low lung volumes with interval development of probable effusions and bibasilar  atelectasis/consolidation. Shelby Mattocks, DO 07/13/2022, 11:32 AM  PGY-2, [facility] FPTS Intern pager: [phone], text pages welcome Secure chat group [facility] Teaching Service

## 2022-07-13 NOTE — Assessment & Plan Note (Signed)
Noted on CT abdomen/pelvis. Not previously diagnosed. Low likelihood of SBP given that patient doesn't have white count or signs of bacterial infection, though we will need to get a sample when patient can tolerate. -Plan to repeat right upper quadrant ultrasound when clinically improved with tapping of fluid if possible 

## 2022-07-13 NOTE — Progress Notes (Addendum)
eLink Physician-Brief Progress Note Patient Name: Alexander [name] DOB: [birth year] MRN: [id number]   Date of Service  07/13/2022  HPI/Events of Note  Brief new admit:  42 yr old male shifted from floor to ICU for worsening sob, AHRF on BiPAP now. Admitted on 13 th for alcohol pancreatitis, then alcohol withdrawals. Electrolytes imbalance, prolonged qtc. On unasyn, s/p lasix.   Camera: Discussed with RN. Tolerating biPAP 15/5/45%. Sats 97%. RR is ok. Sinus tachy < 120. MAP > 65. Not on pressor  Data; Reviewed    eICU Interventions   - CxR, ABG ordered.  - on VTE prophylaxis Asp precautions.  Ground team at bed side now.      Intervention Category Major Interventions: Respiratory failure - evaluation and management Evaluation Type: New Patient Evaluation  Ranee Gosselin 07/13/2022, 9:49 PM  22:26 K+ 3.5, Mag 1.9. Has PIV.  - replacement ordered  Calcium is already being repalced.   ABG & CXR not done yet   03:10 Patient is still very agitated and anxious despite Ativan. Bedside RN wondering if you could order Precedex gtt or start phenobarb (JD from ground team had mentioned considering phenibarb in his note).  Pt also c/o itching, asking for benadryl  - ordering precedex. Will avoid benadryl. Qtc 483, keep K/mag > 4/2 to avoid torsades.  - CxR: low lung volumes, atelectasis, effusion-worsening. - low thresh hold for intubation if worsening pco2, confusion, low GCS.ok, able to protect airways. Per RN discussion.  -Get ABG stat - NPO  04:40 ABG reviewed is Ok. Labs from 0153 were drawn while patient was still receiving electrolyte replacement. Still receiving K+ repalcement. Can you order a BMP for 0800 so we can follow up on K+ & calcium level.  - BMP for 8 AM ordered.

## 2022-07-13 NOTE — Assessment & Plan Note (Addendum)
Nausea and vomiting improved.  He is tachycardic, tachypneic, hypertensive, this is a cloudy picture given combination of alcohol withdrawal and pancreatitis. CT abdomen/pelvis on admission showed signs consistent with acute pancreatitis.  Lipid panel without hypertriglyceridemia.  Right upper quadrant ultrasound with fatty liver but otherwise normal.  It is encouraging that his pain is improved although I am concerned regarding his vital signs for volume overload and acute decompensation which is affecting his respiratory status. - Troponin, EKG, VBG, abdominal XR - Cardiac telemetry  - Vitals per routine - KVO LR -Dilaudid 1 mg q2h PRN for severe pain - Miralax daily PRN for bowel regimen - Clear liquid diet, will advance as able - Given prolonged QT on EKG, will try to treat nausea with the Ativan that is per CIWA protocol

## 2022-07-13 NOTE — Assessment & Plan Note (Signed)
Patient's electrolytes remain a concern in the setting of GI loss and saponification.  K3.7, magnesium 1.7, calcium 4.6 (uncorrected).  Receiving magnesium 2 g IV and calcium 4 g IV.  Goal is normal labs. -CMP at 1500 -Continue supplementation as necessary

## 2022-07-13 NOTE — Assessment & Plan Note (Addendum)
Patient drinks 8oz of gin anywhere from 5-10 times daily, last drink was 6/12 PM.  CIWA's 11> 11> 11 and he appears to be withdrawing on exam. - CIWA protocol - Ativan 1 - 4 mg every hour as needed, closely monitor scoring - Scheduled Ativan 0.5mg  TID, increase as necessary - Monitor BMPs - Cardiac telemetry - Thiamine 100 mg, Folic acid 1 mg,  MVI daily

## 2022-07-13 NOTE — Progress Notes (Addendum)
S: At bedside, RED MEWS noted. Pt notes starting this morning he was having increasing shortness of breath. Feels like his abdominal pain is better. Per partner in room passed some gas this morning. He notes he tried to drink some liquids yesterday but didn't take much.  O: Vitals:   07/13/22 0925 07/13/22 0927  BP:    Pulse: (!) 129   Resp: (!) 23   Temp:    SpO2: 94% 98%   General: A&O, speaking in complete sentences HEENT: No sign of trauma, EOM grossly intact Cardiac: tachycardic, no murmur, Respiratory: increased work of breathing, lungs clear without crackles or wheezes, no stridor, belly breathing noted, on 6L [place] O2 GI: soft, mild diffuse TTP without guarding, hypoactive bowel sounds Extremities: NTTP, no peripheral edema. Neuro: laying in bed, moving all extremities, able to sit up with assistance Psych: appears anxious  A/p: Acute Hypoxic Respiratory Failure with Respiratory Distress - now on 6L [place] O2, was on room air yesterday and 2 L [place] started overnight for comfort but now with documented hypoxia and need for escalation - CXR showing probable effusions and atelectasis, concern for volume overload with IVF he has been receiving for his pancreatitis - stop IVF, IV lasix 40mg  x 1 now, will reassess and monitor closely, consider escalation to BiPAP if needed and consult PCCM if worsening respiratory distress and does not respond to diuresis/BiPAP - VBG ordered - previous CTA without PE and has been on Lovenox for DVT ppx so low suspicion for PE at this time - repeat EKG ordered, troponins ordered  Acute Pancreatitis - holding IVF as above, hypoactive bowel sounds on exam without peritoneal signs - will order KUB to ensure no signs of developing bowel obstruction, per partner in room passing gas this AM  Acute Alcohol Withdrawal - pt is oriented to self and location but seems more confused than yesterday - last few CIWA 8>11>11>11, will continue to monitor, picture  complicated by pancreatitis and now likely fluid overload, has received 7 mg additional Ativan since midnight as well as scheduled 0.5 mg PO TID, cautious to not overuse due to respiratory distress as above  Hypocalcemia - repleted this AM with 4g calcium gluconate, recheck this PM to ensure if further repletion needed  Burley Saver MD  Back at bedside to reassess patient at 1000 : still with increased WOB, asked respiratory to come to bedside to assess for BiPAP.

## 2022-07-13 NOTE — Progress Notes (Signed)
FMTS Interim Progress Note  S: Prior to shift change, evaluated noticed elevation in MUSE to 5.  CIWA scale noted at 39.  Went to evaluate patient and RRT RN was already present.  Urine output poorly tracked this patient got up earlier to urinate.  O: BP (!) 167/109   Pulse 69   Temp 98.8 F (37.1 C) (Oral)   Resp (!) 34   Ht 5\' 7"  (1.702 m)   Wt 67 kg   SpO2 98%   BMI 23.13 kg/m   General: Ill-appearing, acute distress CV: Tachycardic, no murmurs auscultated Respiratory: Diminished breath sounds throughout and absent in the lung bases, tachypneic with increased work of breathing, on 6 L [place], 1-2 word sentences Abdomen: Generalized abdominal tenderness Extremities: No pitting edema of BLEs  A/P: Shortness of breath Chest pressure and shortness of breath continues.  Previous troponins trended flat.  He does look much worse than prior evaluation and I am concerned for fluid overload in addition to his increased work of breathing.  He has become more hypertensive and tachycardia remains.  It does not appear that the Lasix and holding fluids benefited him.  I am concerned that this is much more than strictly an alcohol withdrawal picture as he still remains alert but is unable to speak given the extent of his shortness of breath -Initiate BiPAP -Consulted CCM who agreed to come evaluate  Shelby Mattocks, DO 07/13/2022, 7:21 PM PGY-2, [facility] Family Medicine Service pager [phone]

## 2022-07-13 NOTE — Progress Notes (Signed)
Notified Elink of arrival from [facility] and notified of on Bipap and the change in respiratory status from earlier today on [facility].  Awaiting new orders.

## 2022-07-13 NOTE — Significant Event (Signed)
Rapid Response Event Note   Reason for Call :  CIWA 39, 4mg  ativan   Initial Focused Assessment:  Patient lying in bed, A&O x3. Short shallow breaths, 1-2 word answers, endorses harder to breathe as day progresses. Skin cool to touch, no edema present. Lungs diminished, short of breath. Heart tones fast. Patient appears uncomfortable.   167/109 (126) HR 134 RR 40 Temp  O2 98% 6L [place]  Interventions:  CIWA score 10 MD to bedside Bipap CCM consult  Plan of Care:  Trial bipap to assist respiratory status, CCM consult. Call back for further needs.  Event Summary:  MD Notified: A. Royal Piedra MD Call Time: 1610 Arrival Time: 1842 End Time: 1859  Truddie Crumble, RN

## 2022-07-13 NOTE — Plan of Care (Signed)
  Problem: Clinical Measurements: Goal: Ability to maintain clinical measurements within normal limits will improve Outcome: Progressing Goal: Will remain free from infection Outcome: Progressing Goal: Respiratory complications will improve Outcome: Progressing Goal: Cardiovascular complication will be avoided Outcome: Progressing   Problem: Activity: Goal: Risk for activity intolerance will decrease Outcome: Progressing   Problem: Coping: Goal: Level of anxiety will decrease Outcome: Progressing   Problem: Nutrition: Goal: Adequate nutrition will be maintained Outcome: Not Met (add Reason) Note: Poor appetite

## 2022-07-13 NOTE — Progress Notes (Signed)
Pharmacy Antibiotic Note  Alexander [name] is a 42 y.o. male admitted on 07/11/2022 with  asp pna .  Pharmacy has been consulted for Unasyn dosing.  Plan: Unasyn 3gm IV q6h Will follow renal function, micro data, and pt's clinical condition  Height: 5\' 7"  (170.2 cm) Weight: 67 kg (147 lb 11.3 oz) IBW/kg (Calculated) : 66.1  Temp (24hrs), Avg:98.2 F (36.8 C), Min:97.6 F (36.4 C), Max:98.8 F (37.1 C)  Recent Labs  Lab 07/11/22 0401 07/11/22 0622 07/11/22 1240 07/11/22 1457 07/11/22 1506 07/12/22 0217 07/13/22 0110 07/13/22 1233  WBC 7.8  --   --   --   --  3.5* 6.7  --   CREATININE 1.20  --  0.91  --  0.87 0.91 1.03 1.11  LATICACIDVEN 8.5* 7.8* 3.7* 3.2*  --   --   --   --     Estimated Creatinine Clearance: 79.4 mL/min (by C-G formula based on SCr of 1.11 mg/dL).    No Known Allergies  Antimicrobials this admission: 6/15 Unasyn >>   Thank you for allowing pharmacy to be a part of this patient's care.  Christoper Fabian, PharmD, BCPS Please see amion for complete clinical pharmacist phone list 07/13/2022 8:18 PM

## 2022-07-13 NOTE — Progress Notes (Signed)
Report received on bedside per day shift RN, Critical care PA by bedside. Patient will be transferring to ICU, still tachycardic and tachyphemic. Pt said he is more nauseated than before, on BiPAP,  Respiratory by bedside. Family by bedside. Plan of care continues.

## 2022-07-13 NOTE — Progress Notes (Addendum)
0200: Notified on call MD Jagadish/Quillen that pt has been asking for nicotine patch. Hyperventilating,  o2 sat was 91 % in RA, but gave him 2l for comfort. Last dose of 1mg  iv ativan lasted about an hour,  CIWA score is 12. Pt stated his chest is tight, RR 30-64. Red MEWS, charge RN notified.urinated 300 cc of amber color urine, post void bladder scan was 54 cc.  0214: Notified MD of critical calcium of 4.6 . See MAR for new orders.  Dr. Laroy Apple came by bedside for assessment. Plan of care continues.

## 2022-07-13 NOTE — Progress Notes (Signed)
Patient climbed over the bed rail into the bedside chair.  Staff were alerted by bed alarm.  The patient was unable to stand up from the chair and was transferred from chair with lift sheet and four staff.  Patient continues to be alert and oriented, except intermittently confused to situation.  Patient is on 6 liters high flow and has been most of the day. O2 sats have remained above 90%..Tachypnea and tachycardia have not improved throughout the day.  Patient has retractions and dyspnea.  Rapid response called.  RRT nurse and provider came to the bedside to assess the patient.  Bipap has been ordered.  Family is at the bedside.

## 2022-07-13 NOTE — Assessment & Plan Note (Signed)
Likely secondary to electrolyte abnormalities from recent GI vomiting, but will need to closely monitor.  Will try to avoid QTc prolonging medications as able while still adequately treating the patient.

## 2022-07-13 NOTE — Progress Notes (Signed)
ABG on hold per Chestine Spore, DO

## 2022-07-13 NOTE — Assessment & Plan Note (Signed)
Evidence of severe hepatic steatosis incidentally found on CT abdomen/pelvis.  Right upper quadrant sound with confirmatory findings.  Patient at risk for liver pathology given elevated liver enzymes in the setting of chronic heavy alcoholism and also found to have small volume of ascites on imaging as well.  Hepatitis panel was positive for hepatitis B core IgM, HCV antibody negative.  Hepatitis B surface antibody reflects immune status. - Continue to monitor for further development of ascites

## 2022-07-14 ENCOUNTER — Inpatient Hospital Stay (HOSPITAL_COMMUNITY): Payer: Medicaid Other

## 2022-07-14 DIAGNOSIS — G9340 Encephalopathy, unspecified: Secondary | ICD-10-CM

## 2022-07-14 LAB — GLUCOSE, CAPILLARY
Glucose-Capillary: 115 mg/dL — ABNORMAL HIGH (ref 70–99)
Glucose-Capillary: 81 mg/dL (ref 70–99)
Glucose-Capillary: 88 mg/dL (ref 70–99)
Glucose-Capillary: 109 mg/dL — ABNORMAL HIGH (ref 70–99)
Glucose-Capillary: 110 mg/dL — ABNORMAL HIGH (ref 70–99)
Glucose-Capillary: 163 mg/dL — ABNORMAL HIGH (ref 70–99)

## 2022-07-14 LAB — MAGNESIUM: Magnesium: 2.4 mg/dL (ref 1.7–2.4)

## 2022-07-14 LAB — POCT I-STAT 7, (LYTES, BLD GAS, ICA,H+H)
Acid-base deficit: 4 mmol/L — ABNORMAL HIGH (ref 0.0–2.0)
Bicarbonate: 20.7 mmol/L (ref 20.0–28.0)
Calcium, Ion: 0.89 mmol/L — CL (ref 1.15–1.40)
HCT: 27 % — ABNORMAL LOW (ref 39.0–52.0)
Hemoglobin: 9.2 g/dL — ABNORMAL LOW (ref 13.0–17.0)
O2 Saturation: 98 %
Patient temperature: 98.6
Potassium: 3.5 mmol/L (ref 3.5–5.1)
Sodium: 136 mmol/L (ref 135–145)
TCO2: 22 mmol/L (ref 22–32)
pCO2 arterial: 33.3 mmHg (ref 32–48)
pH, Arterial: 7.402 (ref 7.35–7.45)
pO2, Arterial: 104 mmHg (ref 83–108)

## 2022-07-14 LAB — BASIC METABOLIC PANEL
Anion gap: 10 (ref 5–15)
BUN: 16 mg/dL (ref 6–20)
CO2: 23 mmol/L (ref 22–32)
Calcium: 6 mg/dL — CL (ref 8.9–10.3)
Chloride: 102 mmol/L (ref 98–111)
Creatinine, Ser: 1.17 mg/dL (ref 0.61–1.24)
GFR, Estimated: >60 mL/min (ref >60)
Glucose, Bld: 88 mg/dL (ref 70–99)
Potassium: 3.9 mmol/L (ref 3.5–5.1)
Sodium: 135 mmol/L (ref 135–145)

## 2022-07-14 LAB — COMPREHENSIVE METABOLIC PANEL
ALT: 47 U/L — ABNORMAL HIGH (ref 0–44)
AST: 102 U/L — ABNORMAL HIGH (ref 15–41)
Albumin: 2 g/dL — ABNORMAL LOW (ref 3.5–5.0)
Alkaline Phosphatase: 90 U/L (ref 38–126)
Anion gap: 10 (ref 5–15)
BUN: 14 mg/dL (ref 6–20)
CO2: 20 mmol/L — ABNORMAL LOW (ref 22–32)
Calcium: 5.6 mg/dL — CL (ref 8.9–10.3)
Chloride: 102 mmol/L (ref 98–111)
Creatinine, Ser: 1.1 mg/dL (ref 0.61–1.24)
GFR, Estimated: >60 mL/min (ref >60)
Glucose, Bld: 138 mg/dL — ABNORMAL HIGH (ref 70–99)
Potassium: 3.2 mmol/L — ABNORMAL LOW (ref 3.5–5.1)
Sodium: 132 mmol/L — ABNORMAL LOW (ref 135–145)
Total Bilirubin: 1.9 mg/dL — ABNORMAL HIGH (ref 0.3–1.2)
Total Protein: 4.9 g/dL — ABNORMAL LOW (ref 6.5–8.1)

## 2022-07-14 LAB — RESPIRATORY PANEL BY PCR

## 2022-07-14 LAB — CBC
HCT: 28.1 % — ABNORMAL LOW (ref 39.0–52.0)
Hemoglobin: 9.5 g/dL — ABNORMAL LOW (ref 13.0–17.0)
MCH: 34.3 pg — ABNORMAL HIGH (ref 26.0–34.0)
MCHC: 33.8 g/dL (ref 30.0–36.0)
MCV: 101.4 fL — ABNORMAL HIGH (ref 80.0–100.0)
Platelets: 158 10*3/uL (ref 150–400)
RBC: 2.77 MIL/uL — ABNORMAL LOW (ref 4.22–5.81)
RDW: 12.7 % (ref 11.5–15.5)
WBC: 5.4 10*3/uL (ref 4.0–10.5)
nRBC: 0.7 % — ABNORMAL HIGH (ref 0.0–0.2)

## 2022-07-14 LAB — SARS CORONAVIRUS 2 BY RT PCR: SARS Coronavirus 2 by RT PCR: NEGATIVE

## 2022-07-14 LAB — MRSA NEXT GEN BY PCR, NASAL: MRSA by PCR Next Gen: NOT DETECTED

## 2022-07-14 LAB — HEMOGLOBIN A1C
Hgb A1c MFr Bld: 4.1 % — ABNORMAL LOW (ref 4.8–5.6)
Mean Plasma Glucose: 70.97 mg/dL

## 2022-07-14 MED ORDER — METHYLPREDNISOLONE SODIUM SUCC 40 MG IJ SOLR
40.0000 mg | INTRAMUSCULAR | Status: DC
  Administered 2022-07-14: 40 mg via INTRAVENOUS
  Filled 2022-07-14 (×2): qty 1

## 2022-07-14 MED ORDER — METHYLPREDNISOLONE SODIUM SUCC 40 MG IJ SOLR
40.0000 mg | INTRAMUSCULAR | Status: DC
  Filled 2022-07-14: qty 1

## 2022-07-14 MED ORDER — PHENOBARBITAL 64.8 MG PO TABS
64.8000 mg | ORAL_TABLET | Freq: Three times a day (TID) | ORAL | Status: DC
  Administered 2022-07-14 – 2022-07-15 (×3): 64.8 mg via ORAL
  Filled 2022-07-14 (×3): qty 2

## 2022-07-14 MED ORDER — PHENOBARBITAL 32.4 MG PO TABS: 32.4000 mg | ORAL_TABLET | Freq: Three times a day (TID) | ORAL | Status: DC

## 2022-07-14 MED ORDER — PREDNISOLONE 5 MG PO TABS
40.0000 mg | ORAL_TABLET | Freq: Every day | ORAL | Status: DC
  Administered 2022-07-14 – 2022-07-15 (×2): 40 mg via ORAL
  Filled 2022-07-14 (×2): qty 8

## 2022-07-14 MED ORDER — POTASSIUM CHLORIDE 10 MEQ/100ML IV SOLN
10.0000 meq | INTRAVENOUS | Status: AC
  Administered 2022-07-14 (×6): 10 meq via INTRAVENOUS
  Filled 2022-07-14 (×6): qty 100

## 2022-07-14 MED ORDER — DEXMEDETOMIDINE HCL IN NACL 400 MCG/100ML IV SOLN
0.2000 ug/kg/h | INTRAVENOUS | Status: DC
  Administered 2022-07-14: 0.4 ug/kg/h via INTRAVENOUS
  Administered 2022-07-14 – 2022-07-15 (×2): 0.2 ug/kg/h via INTRAVENOUS
  Administered 2022-07-15: 0.5 ug/kg/h via INTRAVENOUS
  Administered 2022-07-16: 0.4 ug/kg/h via INTRAVENOUS
  Filled 2022-07-14 (×5): qty 100

## 2022-07-14 MED ORDER — PREDNISOLONE 5 MG PO TABS
40.0000 mg | ORAL_TABLET | ORAL | Status: DC
  Administered 2022-07-16 – 2022-07-23 (×8): 40 mg via ORAL
  Filled 2022-07-14 (×9): qty 8

## 2022-07-14 NOTE — Progress Notes (Signed)
Pt C/O L elbow pain after fall. Pt states " It hurts but not bad enough for pain meds". Informed the Agarwala. Informed to monitor it.

## 2022-07-14 NOTE — Progress Notes (Signed)
RT arrived bedside after RN took patient off bipap. Patient now on [place] @5L . Patient tolerating well at this time. RT will continue to monitor as needed.

## 2022-07-14 NOTE — Progress Notes (Signed)
This RN heard bed alarm ran to see pt and pt found on floor. Primary RN/ CCM/ CRN notified. Assessed pt(vitals stable), skin tear found on left knee, and assisted pt back into bed.

## 2022-07-14 NOTE — Progress Notes (Signed)
Inpatient Rehab Admissions Coordinator Note:   Per PT patient was screened for CIR candidacy by Saxon Crosby Luvenia Starch, CCC-SLP. At this time, pt appears to be a potential candidate for CIR. If pt would like to be considered, please place an IP Rehab MD consult order.    Wolfgang Phoenix, MS, CCC-SLP Admissions Coordinator [phone] 07/14/22 4:47 PM

## 2022-07-14 NOTE — Progress Notes (Signed)
Pharmacy Electrolyte Replacement  Recent Labs:  Recent Labs    07/14/22 0153 07/14/22 0426  K 3.2* 3.5  MG 2.4  --   CREATININE 1.10  --     Low Critical Values (K </= 2.5, Phos </= 1, Mg </= 1) Present: None  MD Contacted: n/a  Plan: KCl IV x6 Repeat in AM  Link Snuffer, PharmD, BCPS, BCCCP Clinical Pharmacist Please refer to [facility] for [facility] Pharmacy numbers 07/14/2022, 8:00 AM

## 2022-07-14 NOTE — Progress Notes (Addendum)
NAME:  Alexander [name], MRN:  [id number], DOB:  Jan 26, 1981, LOS: 3 ADMISSION DATE:  07/11/2022, CONSULTATION DATE:  6/15 REFERRING MD:  Dr. Royal Piedra, CHIEF COMPLAINT:  acute pancreatitis   BRIEF  Patient is a 42 year old male with pertinent PMH alcohol abuse presents to [facility] ED on 6/13 with acute pancreatitis.  Patient states on 6/12 had episodes of N/V after eating in the evening.  Throughout the night he was having continual vomiting and abdominal pain.  Patient denies any fevers.  Patient does report he is a heavy drinker.  Drinks 8 ounces of gin 5-10 times per day.  Patient came to [facility] ED on 6/13 for further workup.  Upon arrival to [facility] ED, patient still complaining of upper abdominal pain.  Patient afebrile.  Lipase 1600, LA 8.5, ethanol 156, AST 167, ALT 72.  CTA ABD/pelvis noted acute pancreatitis.  Patient started on IV fluids and given as needed fentanyl for pain.  Patient also with hypokalemia and hypomagnesemia.  Electrolytes supplemented.  Patient initially hypotensive and PCCM consulted but patient responded well to IV fluids and was admitted to progressive floors.  On 6/15, patient w/ RR in 30-40s and sats 91% on room air. Placed on [place]. CIWA score this am around 12. Calcium 4.6 which was repleted. O2 requirements continued to increase now on 6L . CXR showing probable effusions and atelectasis. Concern for volume overload given patient has been receiving IVF for pancreatitis. IVF stopped and given 40 IV lasix. UOP 850 last 12 hours. VBG 7.28, 45, 32, 21. Patient still complaining of sob w/ shallow breaths and chest pressure. Patient also tachycardic 130s. EKG no ST elevation. Trop 23, 37, 34, 25. CIWA scordes now 39. Patient placed on bipap w/ some improvement in wob. PCCM consulted.   Pertinent  Medical History  Alcohol abuse Tobacco abuse Reactive arthritis  Significant Hospital Events: Including procedures, antibiotic start and stop dates in addition to other pertinent events    6/13 patient admitted acute pancreatitis  with intoxication - 156 mg/dL RUQ [place] - fatty liver CT Large amount of peripancreatic fluid and inflammatory. changes concerning for an acute pancreatitis. No discrete pancreatic mass. No pancreatic ductal dilatation. No well organized. peripancreatic fluid collections MOderate  hiatal hernia Small ascites 6/15 patient with worsening SOB due to fluid overload; IV Lasix given and placed on BiPAP; patient CIWA 39 requiring increased amounts of Ativan; PCCM consulted Patient on bipap 10/5, RR in 30s, States his breathing feels better w/ bipap  Interim History / Subjective:   6/16:  Fio2 35%  . On BiPAP.  On Precedex. Got calciumr replacement. Got Primofit cathether Came overngiht to ICU from flooor. Correctd calcium 7.6 and Rx with calcium gluconat. Ex partner at beside and RN report confusion and restlessens overnight. Now better and possibly can come off BiPAP   Objective   Blood pressure 100/74, pulse (!) 101, temperature (!) 97.1 F (36.2 C), temperature source Axillary, resp. rate 20, height 5\' 7"  (1.702 m), weight 73.6 kg, SpO2 100 %.    Vent Mode: BIPAP;PSV FiO2 (%):  [35 %-40 %] 35 % PEEP:  [8 cmH20] 8 cmH20 Pressure Support:  [5 cmH20] 5 cmH20   Intake/Output Summary (Last 24 hours) at 07/14/2022 0841 Last data filed at 07/14/2022 0800 Gross per 24 hour  Intake 2139.61 ml  Output 950 ml  Net 1189.61 ml   Filed Weights   07/11/22 1429 07/13/22 2121  Weight: 67 kg 73.6 kg      General Appearance:  Looks better [employer] chart description Head:  Normocephalic, without obvious abnormality, atraumatic Eyes:  PERRL - yes, conjunctiva/corneas - muddu     Ears:  Normal external ear canals, both ears Nose:  G tube - no Throat:  ETT TUBE - no , OG tube - no . On BIPAP Neck:  Supple,  No enlargement/tenderness/nodules Lungs: Clear to auscultation bilaterally, Heart:  S1 and S2 normal, no murmur, CVP - no.  Pressors - no Abdomen:  Soft, no  masses, no organomegaly Genitalia / Rectal:  Not done Extremities:  Extremities- intact Skin:  ntact in exposed areas . Sacral area - x Neurologic:  Sedation - precedex -> RASS - -1/-2 . Moves all 4s - yes. CAM-ICU - somewhat confusied per RN . Orientation - [facility] Problem list     Assessment & Plan:   Acute alcoholic pancreatitis at admit  6/16: Abd sopft. No nausea  Plan: -hold on further IVF given concern for volume overload and increase wob -prn dilaudid for pain -zofran with Qtc monitoring  Acue alcoholic intoxication at admit: last drink on 6/12; drinks 8 oz of gin anywhere from 5-10 times daily  Alcohol withdrawal: with acute metabolic encephalopathy  6/16: better byut not ready to come off precedex gtt  -Plan: - Prexecedex gtt -CIWA protocol in place -prn ativam -consider phenobarb -Thiamine, folic acid, MVI  Acute respiratory failure w/ hypoxia (RVP negative) Probable small right pleural effusions w/ bibasilar atelectasis/consolidation  (BNO normal 6/15)  6/16: been on BiPAP and clinically looks like he can rest off BiPAP  Plan: -wean bipap to PRN; wean o2 for sats >92% -pulm toieltry: IS -will start on unasyn for aspiration ppx   Hypocalcemia Hypokalemia Hypomagnesemia Prolonged qtc: improved w/ electrolyte correction; EKG on 6/15 qtc 480  6/16L Calcium still low and repleted  Plan: Monitior - check ionized calcium -Trend electrolytes and replete as needed -tele monitoring  Hepatic steatosis Mild ascites   Plan: -cont to monitor for further development of ascites -Supportive care   Acute Acloholic Hepatitis - since admit. Likel meets crtiteria.   Plan - start prednisolone 40mg  per day  - monitor [place] score  Anemia - mild present at admit  6/16: No bleeding but hgb > 9   Plan: -Trend CBC - - PRBC for hgb </= 6.9gm%    - exceptions are   -  if ACS susepcted/confirmed then transfuse for hgb </=  8.0gm%,  or    -  active bleeding with hemodynamic instability, then transfuse regardless of hemoglobin value   At at all times try to transfuse 1 unit prbc as possible with exception of active hemorrhage   Moderate hiatal hernia Plan: -ppi  Tobacco abuse Plan: -nicotine patch   Best Practice (right click and "Reselect all SmartList Selections" daily)   Diet/type: NPO w/ oral meds DVT prophylaxis: LMWH GI prophylaxis: PPI Lines: N/A Foley:  N/A Code Status:  full code  Family updateS: 6/16: Ex partner updated at bedside  Last date of multidisciplinary goals of care discussion [6/15 updated patient and family at bedside]     ATTESTATION & SIGNATURE   The patient JANTHONY BEHAR is critically ill with multiple organ systems failure and requires high complexity decision making for assessment and support, frequent evaluation and titration of therapies, application of advanced monitoring technologies and extensive interpretation of multiple databases and discussion with other appropriate health care personnel such as bedside nurses, social workers, case Production designer, theatre/television/film, Pharmacologist, respiratory therapists, nutritionists,  secretaries etc.,  Critical care time includes but is not restricted to just documentation time. Documentation can happen in parallel or sequential to care time depending on case mix urgency and priorities for the shift. So, overall critical Care Time devoted to patient care services described in this note is  35  Minutes.   This time reflects time of care of this signee Dr Kalman Shan which includ does not reflect procedure time, or teaching time or supervisory time of PA/NP/Med student/Med Resident etc but could involve care discussion time     Dr. Kalman Shan, M.D., [facility].C.P Pulmonary and Critical Care Medicine Staff Physician, [facility] Pager: [phone], If no answer or between  15:00h - 7:00h: call [phone]  07/14/2022 8:41 AM    LABS    PULMONARY Recent Labs  Lab 07/11/22 0329 07/13/22 1033 07/14/22 0426  PHART  --   --  7.402  PCO2ART  --   --  33.3  PO2ART  --   --  104  HCO3  --  21.1 20.7  TCO2 18*  --  22  O2SAT  --  48.9 98    CBC Recent Labs  Lab 07/12/22 0217 07/13/22 0110 07/14/22 0153 07/14/22 0426  HGB 10.7* 10.8* 9.5* 9.2*  HCT 31.1* 32.7* 28.1* 27.0*  WBC 3.5* 6.7 5.4  --   PLT 172 153 158  --     COAGULATION Recent Labs  Lab 07/11/22 0401  INR 1.2    CARDIAC  No results for input(s): "TROPONINI" in the last 168 hours. No results for input(s): "PROBNP" in the last 168 hours.   CHEMISTRY Recent Labs  Lab 07/12/22 0217 07/13/22 0110 07/13/22 1233 07/13/22 2137 07/14/22 0153 07/14/22 0426 07/14/22 0718  NA 138 134* 133* 133* 132* 136 135  K 3.4* 3.7 3.7 3.5 3.2* 3.5 3.9  CL 102 100 102 102 102  --  102  CO2 22 20* 20* 20* 20*  --  23  GLUCOSE 138* 135* 132* 165* 138*  --  88  BUN 7 12 11 12 14   --  16  CREATININE 0.91 1.03 1.11 1.05 1.10  --  1.17  CALCIUM 6.0* 4.6* 5.2* 5.7* 5.6*  --  6.0*  MG 1.6* 1.7 2.0 1.9 2.4  --   --    Estimated Creatinine Clearance: 75.3 mL/min (by C-G formula based on SCr of 1.17 mg/dL).   LIVER Recent Labs  Lab 07/11/22 0401 07/12/22 0217 07/13/22 0110 07/13/22 1233 07/13/22 2137 07/14/22 0153  AST 177* 125* 134* 133* 146* 102*  ALT 72* 56* 51* 50* 54* 47*  ALKPHOS 63 63 69 81 101 90  BILITOT 1.5* 2.8* 2.5* 2.8* 2.3* 1.9*  PROT 5.3* 5.1* 5.2* 5.3* 5.3* 4.9*  ALBUMIN 2.9* 2.8* 2.4* 2.3* 2.3* 2.0*  INR 1.2  --   --   --   --   --      INFECTIOUS Recent Labs  Lab 07/11/22 0622 07/11/22 1240 07/11/22 1457 07/13/22 2137  LATICACIDVEN 7.8* 3.7* 3.2*  --   PROCALCITON  --   --   --  0.91     ENDOCRINE CBG (last 3)  Recent Labs    07/13/22 2313 07/14/22 0358 07/14/22 0747  GLUCAP 144* 115* 81         IMAGING x48h  - image(s) personally visualized  -   highlighted in  bold DG CHEST PORT 1 VIEW  Result Date:  07/13/2022 CLINICAL DATA:  Hypoxia, chest pain EXAM: PORTABLE CHEST 1 VIEW COMPARISON:  07/13/2022 FINDINGS: Heart and mediastinal contours within normal limits. Very low lung volumes. Bibasilar airspace opacities. Possible layering bilateral effusions. Vascular congestion. No acute bony abnormality. IMPRESSION: Very low lung volumes with bibasilar atelectasis or infiltrates, vascular congestion and layering effusions. Electronically Signed   By: Charlett Nose M.D.   On: 07/13/2022 22:38   DG Abd 1 View  Result Date: 07/13/2022 CLINICAL DATA:  Abdominal pain. EXAM: ABDOMEN - 1 VIEW COMPARISON:  None Available. FINDINGS: The bowel gas pattern is normal. No radio-opaque calculi or other significant radiographic abnormality are seen. Bibasilar infiltrates or atelectasis noted as well as tiny right pleural effusion. IMPRESSION: Normal bowel gas pattern. Bibasilar infiltrates or atelectasis, and tiny right pleural effusion. Electronically Signed   By: Danae Orleans M.D.   On: 07/13/2022 10:21   DG CHEST PORT 1 VIEW  Result Date: 07/13/2022 CLINICAL DATA:  10027 Tachypnea [id number] EXAM: PORTABLE CHEST - 1 VIEW COMPARISON:  02/28/2021 FINDINGS: Low lung volumes with interval development of probable effusions right greater than left, and atelectasis/consolidation in the lung bases. Heart size and mediastinal contours are within normal limits. No pneumothorax. Visualized bones unremarkable. IMPRESSION: Low lung volumes with interval development of probable effusions and bibasilar atelectasis/consolidation. Electronically Signed   By: Corlis Leak M.D.   On: 07/13/2022 08:53

## 2022-07-14 NOTE — Evaluation (Signed)
Physical Therapy Evaluation Patient Details Name: Alexander [name] MRN: [id number] DOB: May 01, 1980 Today's Date: 07/14/2022  History of Present Illness  Pt is a 42 y.o. M who presents 07/11/2022 with acute pancreatitis. 6/15 pt with worsening SOB due to fluid overload; IV Lasix given and pt placed on BiPAP. Significant PMH: alcohol abuse.  Clinical Impression  PTA, pt lives alone in a multi-level town home and works a Health and safety inspector job. Pt family visiting from [place]. Pt presents with decreased functional mobility secondary to weakness, decreased cardiopulmonary endurance, impaired sitting/standing balance and cognitive deficits. Pt requiring min-mod assist (+2 safety) for functional mobility. Ambulating 75 ft with a walker and close chair follow. Desat to 84-89% on 4L O2, so bumped up to 6L O2 for remainder of walk with rebound to >/= 91%. BP pre mobility 91/70 (77), post mobility 89/73 (80). Suspect pt will progress well given age, PLOF, and motivation. Anticipate will be able to achieve modI with intensive post acute rehabilitation and pending improved cognition as well. Patient will benefit from intensive inpatient follow up therapy, >3 hours/day.      Recommendations for follow up therapy are one component of a multi-disciplinary discharge planning process, led by the attending physician.  Recommendations may be updated based on patient status, additional functional criteria and insurance authorization.  Follow Up Recommendations       Assistance Recommended at Discharge Frequent or constant Supervision/Assistance  Patient can return home with the following  A little help with walking and/or transfers;A little help with bathing/dressing/bathroom;Assistance with cooking/housework;Direct supervision/assist for medications management;Direct supervision/assist for financial management;Assist for transportation;Help with stairs or ramp for entrance    Equipment Recommendations Rolling walker (2 wheels);BSC/3in1   Recommendations for Other Services       Functional Status Assessment Patient has had a recent decline in their functional status and demonstrates the ability to make significant improvements in function in a reasonable and predictable amount of time.     Precautions / Restrictions Precautions Precautions: Fall;Other (comment) Precaution Comments: watch O2 Restrictions Weight Bearing Restrictions: No      Mobility  Bed Mobility Overal bed mobility: Needs Assistance Bed Mobility: Supine to Sit     Supine to sit: Min assist     General bed mobility comments: Assist to initiate progressing BLE's towards edge of bed, guarding for trunk upon transition to sitting    Transfers Overall transfer level: Needs assistance Equipment used: Rolling walker (2 wheels) Transfers: Sit to/from Stand Sit to Stand: Min assist, +2 safety/equipment           General transfer comment: MinA (+2 safety) to initially rise and steady from bed. Cues for hand placement    Ambulation/Gait Ambulation/Gait assistance: Min assist, Mod assist, +2 safety/equipment Gait Distance (Feet): 75 Feet Assistive device: Rolling walker (2 wheels) Gait Pattern/deviations: Step-through pattern, Decreased dorsiflexion - right, Decreased dorsiflexion - left, Shuffle, Decreased stride length Gait velocity: decreased Gait velocity interpretation: <1.31 ft/sec, indicative of household ambulator   General Gait Details: Consistent cueing for upright posture, walker use, increased bilateral foot clearance, activity pacing. Pt requiring min-modA for balance and close chair follow. Assist for steering walker and navigating obstacles  Stairs            Wheelchair Mobility    Modified Rankin (Stroke Patients Only)       Balance Overall balance assessment: Needs assistance Sitting-balance support: Feet supported Sitting balance-Leahy Scale: Fair Sitting balance - Comments: Right lateral LOB, but able to  correct without physical assist  Standing balance support: Bilateral upper extremity supported Standing balance-Leahy Scale: Poor Standing balance comment: reliant on  BUE support                             Pertinent Vitals/Pain Pain Assessment Pain Assessment: No/denies pain    Home Living Family/patient expects to be discharged to:: Private residence Living Arrangements: Alone Available Help at Discharge: Family;Available PRN/intermittently Type of Home: House (townhouse) Home Access: Level entry       Home Layout: Multi-level Home Equipment: None Additional Comments: 1/2 bath and kitchen on 2nd floor, main bath/bedroom on 3rd floor    Prior Function Prior Level of Function : Independent/Modified Independent             Mobility Comments: works a Health and safety inspector job in [employer]        Extremity/Trunk Assessment   Upper Extremity Assessment Upper Extremity Assessment: Defer to OT evaluation    Lower Extremity Assessment Lower Extremity Assessment: Generalized weakness    Cervical / Trunk Assessment Cervical / Trunk Assessment: Normal  Communication   Communication: Expressive difficulties (soft spoken; reports voice is hoarse)  Cognition Arousal/Alertness: Lethargic Behavior During Therapy: Flat affect Overall Cognitive Status: Impaired/Different from baseline Area of Impairment: Orientation, Attention, Memory, Following commands, Safety/judgement, Awareness, Problem solving                 Orientation Level: Disoriented to, Place Current Attention Level: Sustained, Focused Memory: Decreased short-term memory Following Commands: Follows one step commands with increased time Safety/Judgement: Decreased awareness of safety, Decreased awareness of deficits Awareness: Intellectual Problem Solving: Decreased initiation, Requires verbal cues General Comments: Pt very drowsy, able to maintain alertness with min-mod  stimulation. A&O to self and time, not place (able to correctly guess when given options).        General Comments      Exercises     Assessment/Plan    PT Assessment Patient needs continued PT services  PT Problem List Decreased strength;Decreased activity tolerance;Decreased balance;Decreased mobility;Decreased cognition;Cardiopulmonary status limiting activity       PT Treatment Interventions DME instruction;Gait training;Functional mobility training;Stair training;Therapeutic activities;Therapeutic exercise;Balance training;Patient/family education    PT Goals (Current goals can be found in the Care Plan section)  Acute Rehab PT Goals Patient Stated Goal: did not state PT Goal Formulation: With patient Time For Goal Achievement: 07/28/22 Potential to Achieve Goals: Good    Frequency Min 3X/week     Co-evaluation               AM-PAC PT "6 Clicks" Mobility  Outcome Measure Help needed turning from your back to your side while in a flat bed without using bedrails?: A Little Help needed moving from lying on your back to sitting on the side of a flat bed without using bedrails?: A Little Help needed moving to and from a bed to a chair (including a wheelchair)?: A Little Help needed standing up from a chair using your arms (e.g., wheelchair or bedside chair)?: A Little Help needed to walk in hospital room?: A Lot Help needed climbing 3-5 steps with a railing? : Total 6 Click Score: 15    End of Session Equipment Utilized During Treatment: Gait belt;Oxygen Activity Tolerance: Patient tolerated treatment well Patient left: in chair;with call bell/phone within reach;with chair alarm set Nurse Communication: Mobility status PT Visit Diagnosis: Unsteadiness on feet (R26.81);Muscle weakness (generalized) (M62.81);Difficulty in walking, not  elsewhere classified (R26.2)    Time: 1130-1202 PT Time Calculation (min) (ACUTE ONLY): 32 min   Charges:   PT Evaluation $PT  Eval Moderate Complexity: 1 Mod PT Treatments $Gait Training: 8-22 mins        Lillia Pauls, PT, DPT Acute Rehabilitation Services Office [phone]   Norval Morton 07/14/2022, 12:39 PM

## 2022-07-15 LAB — CBC
HCT: 24.8 % — ABNORMAL LOW (ref 39.0–52.0)
Hemoglobin: 8.1 g/dL — ABNORMAL LOW (ref 13.0–17.0)
MCH: 33.5 pg (ref 26.0–34.0)
MCHC: 32.7 g/dL (ref 30.0–36.0)
MCV: 102.5 fL — ABNORMAL HIGH (ref 80.0–100.0)
Platelets: 148 10*3/uL — ABNORMAL LOW (ref 150–400)
RBC: 2.42 MIL/uL — ABNORMAL LOW (ref 4.22–5.81)
RDW: 12.6 % (ref 11.5–15.5)
WBC: 5.5 10*3/uL (ref 4.0–10.5)
nRBC: 0.5 % — ABNORMAL HIGH (ref 0.0–0.2)

## 2022-07-15 LAB — MAGNESIUM
Magnesium: 2.1 mg/dL (ref 1.7–2.4)
Magnesium: 1.9 mg/dL (ref 1.7–2.4)
Magnesium: 2.7 mg/dL — ABNORMAL HIGH (ref 1.7–2.4)

## 2022-07-15 LAB — BASIC METABOLIC PANEL
Anion gap: 12 (ref 5–15)
Anion gap: 13 (ref 5–15)
Anion gap: 12 (ref 5–15)
BUN: 16 mg/dL (ref 6–20)
BUN: 14 mg/dL (ref 6–20)
BUN: 11 mg/dL (ref 6–20)
CO2: 17 mmol/L — ABNORMAL LOW (ref 22–32)
CO2: 18 mmol/L — ABNORMAL LOW (ref 22–32)
CO2: 21 mmol/L — ABNORMAL LOW (ref 22–32)
Calcium: 6.7 mg/dL — ABNORMAL LOW (ref 8.9–10.3)
Calcium: 6.7 mg/dL — ABNORMAL LOW (ref 8.9–10.3)
Calcium: 7.1 mg/dL — ABNORMAL LOW (ref 8.9–10.3)
Chloride: 103 mmol/L (ref 98–111)
Chloride: 102 mmol/L (ref 98–111)
Chloride: 102 mmol/L (ref 98–111)
Creatinine, Ser: 0.93 mg/dL (ref 0.61–1.24)
Creatinine, Ser: 0.77 mg/dL (ref 0.61–1.24)
Creatinine, Ser: 0.77 mg/dL (ref 0.61–1.24)
GFR, Estimated: >60 mL/min (ref >60)
GFR, Estimated: >60 mL/min (ref >60)
GFR, Estimated: >60 mL/min (ref >60)
Glucose, Bld: 161 mg/dL — ABNORMAL HIGH (ref 70–99)
Glucose, Bld: 133 mg/dL — ABNORMAL HIGH (ref 70–99)
Glucose, Bld: 167 mg/dL — ABNORMAL HIGH (ref 70–99)
Potassium: 4.1 mmol/L (ref 3.5–5.1)
Potassium: 3.8 mmol/L (ref 3.5–5.1)
Potassium: 3.5 mmol/L (ref 3.5–5.1)
Sodium: 132 mmol/L — ABNORMAL LOW (ref 135–145)
Sodium: 133 mmol/L — ABNORMAL LOW (ref 135–145)
Sodium: 135 mmol/L (ref 135–145)

## 2022-07-15 LAB — GLUCOSE, CAPILLARY
Glucose-Capillary: 112 mg/dL — ABNORMAL HIGH (ref 70–99)
Glucose-Capillary: 128 mg/dL — ABNORMAL HIGH (ref 70–99)
Glucose-Capillary: 131 mg/dL — ABNORMAL HIGH (ref 70–99)
Glucose-Capillary: 147 mg/dL — ABNORMAL HIGH (ref 70–99)
Glucose-Capillary: 181 mg/dL — ABNORMAL HIGH (ref 70–99)
Glucose-Capillary: 105 mg/dL — ABNORMAL HIGH (ref 70–99)

## 2022-07-15 LAB — PROTIME-INR
INR: 1.1 (ref 0.8–1.2)
Prothrombin Time: 14.1 seconds (ref 11.4–15.2)

## 2022-07-15 LAB — HEPATITIS PANEL, ACUTE: HCV Ab: NONREACTIVE

## 2022-07-15 LAB — PHOSPHORUS
Phosphorus: 3.9 mg/dL (ref 2.5–4.6)
Phosphorus: 1.9 mg/dL — ABNORMAL LOW (ref 2.5–4.6)

## 2022-07-15 MED ORDER — OXYCODONE-ACETAMINOPHEN 5-325 MG PO TABS
1.0000 | ORAL_TABLET | ORAL | Status: DC | PRN
  Administered 2022-07-15 – 2022-07-18 (×11): 2 via ORAL
  Filled 2022-07-15 (×13): qty 2

## 2022-07-15 MED ORDER — PHENOBARBITAL 16.2 MG PO TABS
32.4000 mg | ORAL_TABLET | Freq: Three times a day (TID) | ORAL | Status: AC
  Administered 2022-07-19 – 2022-07-21 (×6): 32.4 mg via ORAL
  Filled 2022-07-15 (×7): qty 2

## 2022-07-15 MED ORDER — MAGNESIUM SULFATE 2 GM/50ML IV SOLN
2.0000 g | Freq: Once | INTRAVENOUS | Status: AC
  Administered 2022-07-15: 2 g via INTRAVENOUS
  Filled 2022-07-15: qty 50

## 2022-07-15 MED ORDER — LORAZEPAM 1 MG PO TABS
1.0000 mg | ORAL_TABLET | ORAL | Status: DC | PRN
  Administered 2022-07-16 – 2022-07-17 (×4): 1 mg via ORAL
  Filled 2022-07-15 (×4): qty 1

## 2022-07-15 MED ORDER — POTASSIUM PHOSPHATES 15 MMOLE/5ML IV SOLN
30.0000 mmol | Freq: Once | INTRAVENOUS | Status: AC
  Administered 2022-07-15: 30 mmol via INTRAVENOUS
  Filled 2022-07-15: qty 10

## 2022-07-15 MED ORDER — LIP MEDEX EX OINT: TOPICAL_OINTMENT | CUTANEOUS | Status: DC | PRN

## 2022-07-15 MED ORDER — PHENOBARBITAL 32.4 MG PO TABS
97.2000 mg | ORAL_TABLET | Freq: Three times a day (TID) | ORAL | Status: AC
  Administered 2022-07-15 – 2022-07-16 (×5): 97.2 mg via ORAL
  Filled 2022-07-15 (×6): qty 3

## 2022-07-15 MED ORDER — THIAMINE HCL 100 MG/ML IJ SOLN
500.0000 mg | Freq: Three times a day (TID) | INTRAVENOUS | Status: AC
  Administered 2022-07-15 – 2022-07-16 (×5): 500 mg via INTRAVENOUS
  Filled 2022-07-15 (×6): qty 5

## 2022-07-15 MED ORDER — THIAMINE MONONITRATE 100 MG PO TABS
100.0000 mg | ORAL_TABLET | Freq: Every day | ORAL | Status: DC
  Administered 2022-07-17 – 2022-07-23 (×7): 100 mg via ORAL
  Filled 2022-07-15 (×7): qty 1

## 2022-07-15 MED ORDER — THIAMINE MONONITRATE 100 MG PO TABS: 100.0000 mg | ORAL_TABLET | Freq: Every day | ORAL | Status: DC

## 2022-07-15 MED ORDER — LORAZEPAM 2 MG/ML IJ SOLN
1.0000 mg | INTRAMUSCULAR | Status: DC | PRN
  Administered 2022-07-15: 1 mg via INTRAVENOUS
  Filled 2022-07-15: qty 1

## 2022-07-15 MED ORDER — THIAMINE HCL 100 MG/ML IJ SOLN: 100.0000 mg | Freq: Every day | INTRAMUSCULAR | Status: DC

## 2022-07-15 MED ORDER — PHENOBARBITAL 16.2 MG PO TABS
64.8000 mg | ORAL_TABLET | Freq: Three times a day (TID) | ORAL | Status: AC
  Administered 2022-07-17 – 2022-07-19 (×6): 64.8 mg via ORAL
  Filled 2022-07-15: qty 4
  Filled 2022-07-15: qty 2
  Filled 2022-07-15: qty 4
  Filled 2022-07-15 (×3): qty 2

## 2022-07-15 NOTE — Progress Notes (Signed)
Inpatient Rehab Admissions Coordinator:    CIR consult received. Pt. On precedex for ETOH withdrawals, not ready for CIR at this time. I will assess pt. For candidacy once medically a bit more stable.  Megan Salon, MS, CCC-SLP Rehab Admissions Coordinator  [phone] (celll) [phone] (office)

## 2022-07-15 NOTE — Progress Notes (Signed)
Pharmacy Electrolyte Replacement  Recent Labs:  Recent Labs    07/15/22 1221  K 3.8  MG 1.9  PHOS 1.9*  CREATININE 0.77    Low Critical Values (K </= 2.5, Phos </= 1, Mg </= 1) Present: None  MD Contacted: n/a  Plan: KPhos 30 mmol IV x1 + Magnesium 2g IV x1.   Link Snuffer, PharmD, BCPS, BCCCP Clinical Pharmacist Please refer to [facility] for [facility] Pharmacy numbers 07/15/2022, 2:32 PM

## 2022-07-15 NOTE — Evaluation (Signed)
Occupational Therapy Evaluation Patient Details Name: Alexander [name] MRN: [id number] DOB: 24-Dec-1980 Today's Date: 07/15/2022   History of Present Illness Pt is a 42 y.o. M who presents 07/11/2022 with acute pancreatitis. 6/15 pt with worsening SOB due to fluid overload; IV Lasix given and pt placed on BiPAP. Significant PMH: alcohol abuse.   Clinical Impression   Pt evaluated s/p above admission list. Pt reports independence with ADL/IADLs, work and mobility without use of AD at baseline. Pt presents this session with decreased strength, balance, decreased safety awareness, short-term memory, and difficulty with dual-tasking.  Pt currently requires supervision for seated UB ADLs and mod A for LB ADLs. Pt completed STS transfer from EOB using RW with min A and min cues for safe hand placement. Pt completed hallway level mobility using RW with min A +2 for chair follow assist, pt required mod cues throughout for safely navigating through environment. Pt would benefit from continued acute OT services to maximize functional independence and facilitate transition to intensive inpatient follow up therapy, >3 hours/day after discharge.      Recommendations for follow up therapy are one component of a multi-disciplinary discharge planning process, led by the attending physician.  Recommendations may be updated based on patient status, additional functional criteria and insurance authorization.   Assistance Recommended at Discharge Frequent or constant Supervision/Assistance  Patient can return home with the following A lot of help with walking and/or transfers;A lot of help with bathing/dressing/bathroom;Assistance with cooking/housework;Direct supervision/assist for medications management;Direct supervision/assist for financial management;Assist for transportation;Help with stairs or ramp for entrance    Functional Status Assessment  Patient has had a recent decline in their functional status and  demonstrates the ability to make significant improvements in function in a reasonable and predictable amount of time.  Equipment Recommendations  Other (comment) (defer)    Recommendations for Other Services Rehab consult     Precautions / Restrictions Precautions Precautions: Fall Precaution Comments: Acute fall 6/16 Restrictions Weight Bearing Restrictions: No      Mobility Bed Mobility Overal bed mobility: Needs Assistance Bed Mobility: Supine to Sit     Supine to sit: Min guard, HOB elevated     General bed mobility comments: HOB elevated, use of bed rail    Transfers Overall transfer level: Needs assistance Equipment used: Rolling walker (2 wheels) Transfers: Sit to/from Stand Sit to Stand: Min assist           General transfer comment: STS transfer from EOB using RW with min A and min verbal cues for safe hand placement. Pt completed room/hallway level mobility using RW with min A +2 for chair follow and mod cues for safely navigating environment with RW.      Balance Overall balance assessment: Needs assistance Sitting-balance support: Feet supported Sitting balance-Leahy Scale: Fair Sitting balance - Comments: sitting EOB   Standing balance support: Reliant on assistive device for balance, Bilateral upper extremity supported Standing balance-Leahy Scale: Poor Standing balance comment: BUE support on RW                           ADL either performed or assessed with clinical judgement   ADL Overall ADL's : Needs assistance/impaired Eating/Feeding: Set up;Sitting   Grooming: Sitting;Supervision/safety   Upper Body Bathing: Sitting;Supervision/ safety   Lower Body Bathing: Moderate assistance;Sit to/from stand   Upper Body Dressing : Sitting;Supervision/safety   Lower Body Dressing: Moderate assistance;Sit to/from stand   Toilet Transfer: Minimal assistance;Regular Toilet;Rolling  walker (2 wheels) Toilet Transfer Details (indicate cue  type and reason): simulated Toileting- Clothing Manipulation and Hygiene: Moderate assistance;Sit to/from stand       Functional mobility during ADLs: Minimal assistance;Rolling walker (2 wheels) General ADL Comments: limited secondary to generalized weakness, balance, and requires cues.     Vision Baseline Vision/History: 0 No visual deficits Ability to See in Adequate Light: 0 Adequate Vision Assessment?: No apparent visual deficits     Perception Perception Perception Tested?: No   Praxis Praxis Praxis tested?: Not tested    Pertinent Vitals/Pain Pain Assessment Pain Assessment: Faces Faces Pain Scale: Hurts a little bit Pain Location: lower back Pain Descriptors / Indicators: Grimacing, Discomfort Pain Intervention(s): Monitored during session, Limited activity within patient's tolerance     Hand Dominance Right   Extremity/Trunk Assessment Upper Extremity Assessment Upper Extremity Assessment: Generalized weakness   Lower Extremity Assessment Lower Extremity Assessment: Defer to PT evaluation   Cervical / Trunk Assessment Cervical / Trunk Assessment: Normal   Communication Communication Communication: No difficulties   Cognition Arousal/Alertness: Awake/alert Behavior During Therapy: Flat affect Overall Cognitive Status: Impaired/Different from baseline Area of Impairment: Attention, Memory, Following commands, Safety/judgement, Awareness, Problem solving                 Orientation Level: Situation Current Attention Level: Sustained Memory: Decreased recall of precautions, Decreased short-term memory Following Commands: Follows one step commands with increased time Safety/Judgement: Decreased awareness of safety, Decreased awareness of deficits Awareness: Intellectual Problem Solving: Decreased initiation, Requires verbal cues, Slow processing General Comments: A+O x3 withincreased time and cues to accurately recall month. Pt able to recall 2/3  objects and state the months of the year in correct order with increased time. Pt unable to accurately state the months of the year backwards and count backwards from 7's  and 3's. Pt noted to have difficulty with dual-tasking, problem solving, delayed recall and navigating the environment.     General Comments  VSS on RA; BP in supine: 92/70, BP standing: 85/69, BP sitting after activity: 98/73    Exercises     Shoulder Instructions      Home Living Family/patient expects to be discharged to:: Private residence Living Arrangements: Alone Available Help at Discharge: Family;Available PRN/intermittently Type of Home: House (townhouse) Home Access: Level entry     Home Layout: Multi-level   Alternate Level Stairs-Rails: Right;Left Bathroom Shower/Tub: Tub/shower unit;Walk-in shower         Home Equipment: None   Additional Comments: 1/2 bath and kitchen on 2nd floor, main bath/bedroom on 3rd floor      Prior Functioning/Environment Prior Level of Function : Independent/Modified Independent             Mobility Comments: works a Health and safety inspector job in [employer] ADLs Comments: Independent with ADL/IADLs and working        OT Problem List: Decreased strength;Decreased activity tolerance;Impaired balance (sitting and/or standing);Decreased cognition;Decreased safety awareness;Decreased knowledge of precautions;Decreased knowledge of use of DME or AE;Pain      OT Treatment/Interventions: Self-care/ADL training;Energy conservation;DME and/or AE instruction;Therapeutic activities;Cognitive remediation/compensation;Patient/family education;Balance training    OT Goals(Current goals can be found in the care plan section) Acute Rehab OT Goals Patient Stated Goal: to go for a walk OT Goal Formulation: With patient Time For Goal Achievement: 07/29/22 Potential to Achieve Goals: Good ADL Goals Pt Will Perform Lower Body Dressing: sit to/from stand;with modified independence Pt Will  Transfer to Toilet: with modified independence;regular height toilet;ambulating Additional ADL Goal #1: Pt will  complete medication management task with min cues to assess independence with IADLs.  OT Frequency: Min 2X/week    Co-evaluation              AM-PAC OT "6 Clicks" Daily Activity     Outcome Measure Help from another person eating meals?: None Help from another person taking care of personal grooming?: A Little Help from another person toileting, which includes using toliet, bedpan, or urinal?: A Little Help from another person bathing (including washing, rinsing, drying)?: A Lot Help from another person to put on and taking off regular upper body clothing?: A Little Help from another person to put on and taking off regular lower body clothing?: A Lot 6 Click Score: 17   End of Session Equipment Utilized During Treatment: Gait belt;Rolling walker (2 wheels) Nurse Communication: Mobility status  Activity Tolerance: Patient tolerated treatment well Patient left: in chair;with call bell/phone within reach;with chair alarm set  OT Visit Diagnosis: Unsteadiness on feet (R26.81);Muscle weakness (generalized) (M62.81);Pain                Time: 1610-9604 OT Time Calculation (min): 33 min Charges:  OT General Charges $OT Visit: 1 Visit OT Evaluation $OT Eval Moderate Complexity: 1 Mod OT Treatments $Therapeutic Activity: 8-22 mins  Sherley Bounds, OTS Acute Rehabilitation Services Office [phone] Secure Chat Communication Preferred   Sherley Bounds 07/15/2022, 11:36 AM

## 2022-07-15 NOTE — Progress Notes (Signed)
NAME:  Alexander [name], MRN:  [id number], DOB:  [birth year], LOS: 4 ADMISSION DATE:  07/11/2022, CONSULTATION DATE:  6/15 REFERRING MD:  Dr. Royal Piedra, CHIEF COMPLAINT:  acute pancreatitis   BRIEF  Patient is a 42 year old male with pertinent PMH alcohol abuse presents to [facility] ED on 6/13 with acute pancreatitis.  Patient states on 6/12 had episodes of N/V after eating in the evening.  Throughout the night he was having continual vomiting and abdominal pain.  Patient denies any fevers.  Patient does report he is a heavy drinker.  Drinks 8 ounces of gin 5-10 times per day.  Patient came to [facility] ED on 6/13 for further workup.  Upon arrival to [facility] ED, patient still complaining of upper abdominal pain.  Patient afebrile.  Lipase 1600, LA 8.5, ethanol 156, AST 167, ALT 72.  CTA ABD/pelvis noted acute pancreatitis.  Patient started on IV fluids and given as needed fentanyl for pain.  Patient also with hypokalemia and hypomagnesemia.  Electrolytes supplemented.  Patient initially hypotensive and PCCM consulted but patient responded well to IV fluids and was admitted to progressive floors.  On 6/15, patient w/ RR in 30-40s and sats 91% on room air. Placed on [place]reamer. CIWA score this am around 12. Calcium 4.6 which was repleted. O2 requirements continued to increase now on 6L [place]. CXR showing probable effusions and atelectasis. Concern for volume overload given patient has been receiving IVF for pancreatitis. IVF stopped and given 40 IV lasix. UOP 850 last 12 hours. VBG 7.28, 45, 32, 21. Patient still complaining of sob w/ shallow breaths and chest pressure. Patient also tachycardic 130s. EKG no ST elevation. Trop 23, 37, 34, 25. CIWA scordes now 39. Patient placed on bipap w/ some improvement in wob. PCCM consulted.  Pertinent  Medical History  Alcohol abuse Tobacco abuse Reactive arthritis  Significant Hospital Events: Including procedures, antibiotic start and stop dates in addition to other pertinent events   6/13  patient admitted acute pancreatitis  with intoxication - 156 mg/dL RUQ [place] - fatty liver CT Large amount of peripancreatic fluid and inflammatory. changes concerning for an acute pancreatitis. No discrete pancreatic mass. No pancreatic ductal dilatation. No well organized. peripancreatic fluid collections MOderate  hiatal hernia Small ascites 6/15 patient with worsening SOB due to fluid overload; IV Lasix given and placed on BiPAP; patient CIWA 39 requiring increased amounts of Ativan; PCCM consulted Patient on bipap 10/5, RR in 30s, States his breathing feels better w/ bipap  Interim History / Subjective:  Awake alert interactive -On Precedex -Tolerating clear liquids  Objective   Blood pressure 92/70, pulse 77, temperature (!) 97.3 F (36.3 C), temperature source Oral, resp. rate 20, height 5\' 7"  (1.702 m), weight 76.3 kg, SpO2 95 %.        Intake/Output Summary (Last 24 hours) at 07/15/2022 1043 Last data filed at 07/15/2022 0800 Gross per 24 hour  Intake 1903.85 ml  Output 800 ml  Net 1103.85 ml   Filed Weights   07/11/22 1429 07/13/22 2121 07/15/22 0330  Weight: 67 kg 73.6 kg 76.3 kg   Middle-aged, does not appear to be in distress Moist oral mucosa Clear breath sounds bilaterally S1-S2 appreciated Bowel sounds appreciated Extremities shows no clubbing no edema  Chronic anemia  Resolved Hospital Problem list     Assessment & Plan:   Acute alcoholic pancreatitis -Pain management -Zofran for nausea -Roxy Cedar, tolerating orally  Alcohol withdrawal -On Precedex -Optimize phenobarb -Low-dose benzodiazepine -Continue MVI, thiamine, folic acid  Acute  respiratory failure with hypoxia -Now on room air -Required BiPAP 86/60  Electrolyte derangements Hypocalcemia Hypokalemia -Repleted  Hepatic steatosis Mild ascites -Continue to monitor -Supportive care  Alcoholic hepatitis -On prednisone 40 mg daily  Chronic anemia -Continue to trend  Tobacco  abuse -Nicotine patch in place   Best Practice (right click and "Reselect all SmartList Selections" daily)   Diet/type: Regular consistency (see orders) and NPO w/ oral meds DVT prophylaxis: LMWH GI prophylaxis: PPI Lines: N/A Foley:  N/A Code Status:  full code  Family updateS: 6/16: Ex partner updated at bedside  Last date of multidisciplinary goals of care discussion [6/15 updated patient and family at bedside]  The patient is critically ill with multiple organ systems failure and requires high complexity decision making for assessment and support, frequent evaluation and titration of therapies, application of advanced monitoring technologies and extensive interpretation of multiple databases. Critical Care Time devoted to patient care services described in this note independent of APP/resident time (if applicable)  is 33 minutes.   Virl Diamond MD [facility] Personal pager: See Amion If unanswered, please page CCM On-call: #[phone]

## 2022-07-15 NOTE — Progress Notes (Signed)
Decision made to restart Precedex drip.   Pt restless and picking at gown. Prior to starting drip pt given PRN Ativan and PRN Dilaudid with little to no effect. Furthermore pt continues to be tachycardic and hypertensive.

## 2022-07-16 ENCOUNTER — Encounter (HOSPITAL_COMMUNITY): Payer: Self-pay | Admitting: Family Medicine

## 2022-07-16 ENCOUNTER — Inpatient Hospital Stay (HOSPITAL_COMMUNITY): Payer: Medicaid Other

## 2022-07-16 LAB — BASIC METABOLIC PANEL
Anion gap: 10 (ref 5–15)
BUN: 7 mg/dL (ref 6–20)
CO2: 24 mmol/L (ref 22–32)
Calcium: 7.4 mg/dL — ABNORMAL LOW (ref 8.9–10.3)
Chloride: 102 mmol/L (ref 98–111)
Creatinine, Ser: 0.76 mg/dL (ref 0.61–1.24)
GFR, Estimated: >60 mL/min (ref >60)
Glucose, Bld: 116 mg/dL — ABNORMAL HIGH (ref 70–99)
Potassium: 3 mmol/L — ABNORMAL LOW (ref 3.5–5.1)
Sodium: 136 mmol/L (ref 135–145)

## 2022-07-16 LAB — CBC
HCT: 26.8 % — ABNORMAL LOW (ref 39.0–52.0)
Hemoglobin: 9 g/dL — ABNORMAL LOW (ref 13.0–17.0)
MCH: 34.1 pg — ABNORMAL HIGH (ref 26.0–34.0)
MCHC: 33.6 g/dL (ref 30.0–36.0)
MCV: 101.5 fL — ABNORMAL HIGH (ref 80.0–100.0)
Platelets: 191 10*3/uL (ref 150–400)
RBC: 2.64 MIL/uL — ABNORMAL LOW (ref 4.22–5.81)
RDW: 13 % (ref 11.5–15.5)
WBC: 8.3 10*3/uL (ref 4.0–10.5)
nRBC: 0.8 % — ABNORMAL HIGH (ref 0.0–0.2)

## 2022-07-16 LAB — GLUCOSE, CAPILLARY
Glucose-Capillary: 102 mg/dL — ABNORMAL HIGH (ref 70–99)
Glucose-Capillary: 126 mg/dL — ABNORMAL HIGH (ref 70–99)
Glucose-Capillary: 91 mg/dL (ref 70–99)
Glucose-Capillary: 125 mg/dL — ABNORMAL HIGH (ref 70–99)
Glucose-Capillary: 110 mg/dL — ABNORMAL HIGH (ref 70–99)
Glucose-Capillary: 99 mg/dL (ref 70–99)

## 2022-07-16 LAB — CALCIUM, IONIZED: Calcium, Ionized, Serum: 4.1 mg/dL — ABNORMAL LOW (ref 4.5–5.6)

## 2022-07-16 LAB — PHOSPHORUS: Phosphorus: 2.6 mg/dL (ref 2.5–4.6)

## 2022-07-16 LAB — MAGNESIUM: Magnesium: 2 mg/dL (ref 1.7–2.4)

## 2022-07-16 MED ORDER — AMLODIPINE BESYLATE 5 MG PO TABS
5.0000 mg | ORAL_TABLET | Freq: Every day | ORAL | Status: DC
  Administered 2022-07-16 – 2022-07-23 (×8): 5 mg via ORAL
  Filled 2022-07-16 (×8): qty 1

## 2022-07-16 MED ORDER — POTASSIUM CHLORIDE 20 MEQ PO PACK
40.0000 meq | PACK | Freq: Two times a day (BID) | ORAL | Status: AC
  Administered 2022-07-16 (×2): 40 meq via ORAL
  Filled 2022-07-16 (×2): qty 2

## 2022-07-16 MED ORDER — AMOXICILLIN-POT CLAVULANATE 875-125 MG PO TABS
1.0000 | ORAL_TABLET | Freq: Two times a day (BID) | ORAL | Status: DC
  Administered 2022-07-16 – 2022-07-19 (×7): 1 via ORAL
  Filled 2022-07-16 (×8): qty 1

## 2022-07-16 MED ORDER — FOLIC ACID 1 MG PO TABS
1.0000 mg | ORAL_TABLET | Freq: Every day | ORAL | Status: DC
  Administered 2022-07-16 – 2022-07-23 (×8): 1 mg via ORAL
  Filled 2022-07-16 (×8): qty 1

## 2022-07-16 MED ORDER — ALBUTEROL SULFATE (2.5 MG/3ML) 0.083% IN NEBU
2.5000 mg | INHALATION_SOLUTION | RESPIRATORY_TRACT | Status: DC | PRN
  Administered 2022-07-16: 2.5 mg via RESPIRATORY_TRACT
  Filled 2022-07-16: qty 3

## 2022-07-16 MED ORDER — HYDROXYZINE HCL 25 MG PO TABS
25.0000 mg | ORAL_TABLET | Freq: Three times a day (TID) | ORAL | Status: DC | PRN
  Administered 2022-07-16 – 2022-07-20 (×3): 25 mg via ORAL
  Filled 2022-07-16 (×3): qty 1

## 2022-07-16 MED ORDER — FUROSEMIDE 10 MG/ML IJ SOLN
40.0000 mg | Freq: Once | INTRAMUSCULAR | Status: AC
  Administered 2022-07-16: 40 mg via INTRAVENOUS
  Filled 2022-07-16: qty 4

## 2022-07-16 MED ORDER — PANTOPRAZOLE SODIUM 40 MG PO TBEC
40.0000 mg | DELAYED_RELEASE_TABLET | Freq: Every day | ORAL | Status: DC
  Administered 2022-07-16 – 2022-07-22 (×7): 40 mg via ORAL
  Filled 2022-07-16 (×7): qty 1

## 2022-07-16 NOTE — TOC Initial Note (Signed)
Transition of Care [facility]) - Initial/Assessment Note    Patient Details  Name: Alexander [name] MRN: [id number] Date of Birth: 1980/01/30  Transition of Care [facility] CM/SW Contact:    Lorri Frederick, LCSW Phone Number: 07/16/2022, 11:15 AM  Clinical Narrative:      CSW met with pt for initial assessment, introduced self and explained TOC role.  Pt from home with roommate but reports they are on opposite work shifts and usually not home at the same time.  Permission given to speak with sister Alexander [name] and mother Alexander [name].  TOC will continue to follow.               Expected Discharge Plan:  (TBD) Barriers to Discharge: Continued Medical Work up   Patient Goals and CMS Choice Patient states their goals for this hospitalization and ongoing recovery are:: get better          Expected Discharge Plan and Services       Living arrangements for the past 2 months: Single Family Home                                      Prior Living Arrangements/Services Living arrangements for the past 2 months: Single Family Home Lives with:: Roommate Patient language and need for interpreter reviewed:: Yes Do you feel safe going back to the place where you live?: Yes      Need for Family Participation in Patient Care: No (Comment) Care giver support system in place?: Yes (comment)   Criminal Activity/Legal Involvement Pertinent to Current Situation/Hospitalization: No - Comment as needed  Activities of Daily Living Home Assistive Devices/Equipment: None ADL Screening (condition at time of admission) Patient's cognitive ability adequate to safely complete daily activities?: Yes Is the patient deaf or have difficulty hearing?: No Does the patient have difficulty seeing, even when wearing glasses/contacts?: No Does the patient have difficulty concentrating, remembering, or making decisions?: No Patient able to express need for assistance with ADLs?: Yes Does the patient have difficulty  dressing or bathing?: No Independently performs ADLs?: Yes (appropriate for developmental age) Does the patient have difficulty walking or climbing stairs?: No Weakness of Legs: None Weakness of Arms/Hands: None  Permission Sought/Granted Permission sought to share information with : Family Supports Permission granted to share information with : Yes, Verbal Permission Granted  Share Information with NAME: sister Alexander [name], mother Doris           Emotional Assessment Appearance:: Appears stated age Attitude/Demeanor/Rapport: Engaged Affect (typically observed): Appropriate Orientation: : Oriented to Self, Oriented to Place, Oriented to  Time, Oriented to Situation Alcohol / Substance Use: Alcohol Use    Admission diagnosis:  Acute pancreatitis [K85.90] Hypokalemia [E87.6] Hypomagnesemia [E83.42] Shock (HCC) [R57.9] Alcohol-induced acute pancreatitis, unspecified complication status [K85.20] Patient Active Problem List   Diagnosis Date Noted   Hypokalemia 07/13/2022   Hypomagnesemia 07/13/2022   Acute respiratory failure with hypoxia (HCC) 07/13/2022   Hypocalcemia 07/13/2022   Acute pancreatitis 07/11/2022   Alcohol withdrawal (HCC) 07/11/2022   Anemia 07/11/2022   Prolonged QT interval 07/11/2022   Hepatic steatosis 07/11/2022   History of sexual behavior with high risk of exposure to communicable disease 07/11/2022   Ascites 07/11/2022   Alcohol use 07/11/2022   Electrolyte abnormality 07/11/2022   PCP:  Patient, No Pcp Per Pharmacy:   [id number] - [place], [place] - [address] AT [facility] [address]  [address] [facility] [address] [place] [address] Phone: [phone] Fax: [phone]     Social Determinants of Health (SDOH) Social History: SDOH Screenings   Tobacco Use: Low Risk  (07/11/2022)   SDOH Interventions:     Readmission Risk Interventions     No data to display

## 2022-07-16 NOTE — Progress Notes (Signed)
Physical Therapy Treatment Patient Details Name: Alexander [name] MRN: [id number] DOB: 10-20-1980 Today's Date: 07/16/2022   History of Present Illness Pt is a 42 y.o. M who presents 07/11/2022 with acute pancreatitis. 6/15 pt with worsening SOB due to fluid overload; IV Lasix given and pt placed on BiPAP. Significant PMH: alcohol abuse.    PT Comments    Pt bed alarm sounding and pt up OOB on PT arrival, standing with single UE support on bed rail, this PTA arriving for once pt seated up in chair with pt eager for continued mobility. Pt continues to be limited by decreased insight into deficits, impaired cognition, poor balance/postural reactions and decreased activity tolerance. Pt requiring grossly min-mod A for functional transfers and gait with up to x2 HHA as pt with tendency for posterior lean in standing. Pt needing cues throughout mobility for safety, attention to task and posture. Pt HR elevated to 150bpm during activity, resolving with rest breaks. Current plan remains appropriate to address deficits and maximize functional independence and decrease caregiver burden. Pt continues to benefit from skilled PT services to progress toward functional mobility goals.     Recommendations for follow up therapy are one component of a multi-disciplinary discharge planning process, led by the attending physician.  Recommendations may be updated based on patient status, additional functional criteria and insurance authorization.  Follow Up Recommendations       Assistance Recommended at Discharge Frequent or constant Supervision/Assistance  Patient can return home with the following A little help with walking and/or transfers;A little help with bathing/dressing/bathroom;Assistance with cooking/housework;Direct supervision/assist for medications management;Direct supervision/assist for financial management;Assist for transportation;Help with stairs or ramp for entrance   Equipment Recommendations   Rolling walker (2 wheels);BSC/3in1    Recommendations for Other Services       Precautions / Restrictions Precautions Precautions: Fall Precaution Comments: Acute fall 6/16 Restrictions Weight Bearing Restrictions: No     Mobility  Bed Mobility Overal bed mobility: Needs Assistance Bed Mobility: Supine to Sit     Supine to sit: HOB elevated, Supervision     General bed mobility comments: HOB elevated, use of bed rail (pt OOB on PT arrival with all 4 rails raised, able to exit bed without assist, but benefits from supervision for safety)    Transfers Overall transfer level: Needs assistance Equipment used: 2 person hand held assist Transfers: Sit to/from Stand Sit to Stand: Min assist           General transfer comment: min A to rise from recliner, cues for hand placement,    Ambulation/Gait Ambulation/Gait assistance: Min assist, Mod assist, +2 safety/equipment Gait Distance (Feet): 150 Feet Assistive device: 1 person hand held assist, 2 person hand held assist Gait Pattern/deviations: Step-through pattern, Decreased dorsiflexion - right, Decreased dorsiflexion - left, Shuffle, Decreased stride length, Narrow base of support Gait velocity: decreased     General Gait Details: initially x2 HHA progressing to x1 HHA on R, Consistent cueing for upright posture,  increased bilateral foot clearance, and activity pacing. Pt requiring min-modA for balance as pt with tendency for posterior lean needing cues to correct,  HR up to 150bpm during activity,   Stairs             Wheelchair Mobility    Modified Rankin (Stroke Patients Only)       Balance Overall balance assessment: Needs assistance Sitting-balance support: Feet supported Sitting balance-Leahy Scale: Fair Sitting balance - Comments: sitting EOB   Standing balance support: Reliant on assistive  device for balance, Single extremity supported Standing balance-Leahy Scale: Poor Standing balance  comment: reliant on single UE support for static standing and UE support and assist for dynamic activities                            Cognition Arousal/Alertness: Awake/alert Behavior During Therapy: Impulsive, Flat affect Overall Cognitive Status: Impaired/Different from baseline Area of Impairment: Attention, Memory, Following commands, Safety/judgement, Awareness, Problem solving                 Orientation Level: Situation Current Attention Level: Sustained Memory: Decreased recall of precautions, Decreased short-term memory Following Commands: Follows one step commands with increased time Safety/Judgement: Decreased awareness of safety, Decreased awareness of deficits Awareness: Intellectual Problem Solving: Decreased initiation, Requires verbal cues, Slow processing General Comments: Pt noted to have difficulty with dual-tasking, problem solving, delayed recall and navigating the environment. Pt needing max cues for attention and re-dircection to task as pt tangential and can be impulsive        Exercises      General Comments General comments (skin integrity, edema, etc.): SpO2 stable on RA throughout mobility, HR up to 150bpm with activity, resolving with seated rest      Pertinent Vitals/Pain Pain Assessment Pain Assessment: No/denies pain Pain Intervention(s): Monitored during session    Home Living                          Prior Function            PT Goals (current goals can now be found in the care plan section) Acute Rehab PT Goals Patient Stated Goal: to walk more PT Goal Formulation: With patient Time For Goal Achievement: 07/28/22 Progress towards PT goals: Progressing toward goals    Frequency    Min 3X/week      PT Plan Current plan remains appropriate    Co-evaluation              AM-PAC PT "6 Clicks" Mobility   Outcome Measure  Help needed turning from your back to your side while in a flat bed without  using bedrails?: A Little Help needed moving from lying on your back to sitting on the side of a flat bed without using bedrails?: A Little Help needed moving to and from a bed to a chair (including a wheelchair)?: A Little Help needed standing up from a chair using your arms (e.g., wheelchair or bedside chair)?: A Little Help needed to walk in hospital room?: A Lot Help needed climbing 3-5 steps with a railing? : Total 6 Click Score: 15    End of Session Equipment Utilized During Treatment: Gait belt Activity Tolerance: Patient tolerated treatment well Patient left: with call bell/phone within reach;in bed;with nursing/sitter in room Nurse Communication: Mobility status PT Visit Diagnosis: Unsteadiness on feet (R26.81);Muscle weakness (generalized) (M62.81);Difficulty in walking, not elsewhere classified (R26.2)     Time: 6578-4696 PT Time Calculation (min) (ACUTE ONLY): 23 min  Charges:  $Gait Training: 8-22 mins $Therapeutic Activity: 8-22 mins                     Shahed Yeoman R. PTA Acute Rehabilitation Services Office: [phone]   Catalina Antigua 07/16/2022, 1:07 PM

## 2022-07-16 NOTE — Progress Notes (Signed)
NAME:  Alexander [name], MRN:  [id number], DOB:  26-May-1980, LOS: 5 ADMISSION DATE:  07/11/2022, CONSULTATION DATE:  6/15 REFERRING MD:  Dr. Royal Piedra, CHIEF COMPLAINT:  acute pancreatitis   BRIEF  Patient is a 42 year old male with pertinent PMH alcohol abuse presents to [facility] ED on 6/13 with acute pancreatitis.  Patient states on 6/12 had episodes of N/V after eating in the evening.  Throughout the night he was having continual vomiting and abdominal pain.  Patient denies any fevers.  Patient does report he is a heavy drinker.  Drinks 8 ounces of gin 5-10 times per day.  Patient came to [facility] ED on 6/13 for further workup.  Upon arrival to [facility] ED, patient still complaining of upper abdominal pain.  Patient afebrile.  Lipase 1600, LA 8.5, ethanol 156, AST 167, ALT 72.  CTA ABD/pelvis noted acute pancreatitis.  Patient started on IV fluids and given as needed fentanyl for pain.  Patient also with hypokalemia and hypomagnesemia.  Electrolytes supplemented.  Patient initially hypotensive and PCCM consulted but patient responded well to IV fluids and was admitted to progressive floors.  On 6/15, patient w/ RR in 30-40s and sats 91% on room air. Placed on [place]. CIWA score this am around 12. Calcium 4.6 which was repleted. O2 requirements continued to increase now on 6L . CXR showing probable effusions and atelectasis. Concern for volume overload given patient has been receiving IVF for pancreatitis. IVF stopped and given 40 IV lasix. UOP 850 last 12 hours. VBG 7.28, 45, 32, 21. Patient still complaining of sob w/ shallow breaths and chest pressure. Patient also tachycardic 130s. EKG no ST elevation. Trop 23, 37, 34, 25. CIWA scordes now 39. Patient placed on bipap w/ some improvement in wob. PCCM consulted.  Pertinent  Medical History  Alcohol abuse Tobacco abuse Reactive arthritis  Significant Hospital Events: Including procedures, antibiotic start and stop dates in addition to other pertinent events   6/13  patient admitted acute pancreatitis  with intoxication - 156 mg/dL RUQ [place] - fatty liver CT Large amount of peripancreatic fluid and inflammatory. changes concerning for an acute pancreatitis. No discrete pancreatic mass. No pancreatic ductal dilatation. No well organized. peripancreatic fluid collections MOderate  hiatal hernia Small ascites 6/15 patient with worsening SOB due to fluid overload; IV Lasix given and placed on BiPAP; patient CIWA 39 requiring increased amounts of Ativan; PCCM consulted Patient on bipap 10/5, RR in 30s, States his breathing feels better w/ bipap he continues oral as well he is eating  6/18- Interim History / Subjective:  Was short of breath during the night Chest x-ray-atelectasis, pleural effusions  Objective   Blood pressure (!) 131/94, pulse (!) 111, temperature 99.4 F (37.4 C), temperature source Oral, resp. rate (!) 24, height 5\' 7"  (1.702 m), weight 74.7 kg, SpO2 92 %.        Intake/Output Summary (Last 24 hours) at 07/16/2022 4098 Last data filed at 07/16/2022 0737 Gross per 24 hour  Intake 1972.78 ml  Output 2350 ml  Net -377.22 ml   Filed Weights   07/13/22 2121 07/15/22 0330 07/16/22 0500  Weight: 73.6 kg 76.3 kg 74.7 kg   Middle-age, does not appear to be in distress at rest Moist oral mucosa Decreased air movement at the bases bilaterally with some rales S1-S2 appreciated Bowel sounds appreciated Extremities with no clubbing  Labs reviewed Potassium of 3, BUN 7, creatinine 0.76 H&H 9/26.8  Resolved Hospital Problem list     Assessment & Plan:  Acute alcoholic pancreatitis -Continue pain management -Tolerating orally now  Alcohol withdrawal -Now off Precedex -Continue phenobarb -Low-dose benzodiazepine -Continue MVI, thiamine, folic acid  Hypoxemia -Likely combination of his recent aspiration pneumonia and pleural effusions -Is 12 L positive -Will start cautious diuresis with close monitoring  Acute hypoxemic  respiratory failure -Oxygen supplementation -Pulmonary toileting  Hepatic steatosis Mild ascites -Continue to monitor -Supportive care  Alcoholic hepatitis -On prednisone, 40 daily -Monitor [place] score at 7 days-Saturday to see if any other intervention is better than being on steroids  Chronic anemia -Continue to trend  Nicotine patch in place  Will transfer to progressive floor today  Best Practice (right click and "Reselect all SmartList Selections" daily)   Diet/type: Regular consistency (see orders) and NPO w/ oral meds DVT prophylaxis: LMWH GI prophylaxis: PPI Lines: N/A Foley:  N/A Code Status:  full code  The patient is critically ill with multiple organ systems failure and requires high complexity decision making for assessment and support, frequent evaluation and titration of therapies, application of advanced monitoring technologies and extensive interpretation of multiple databases. Critical Care Time devoted to patient care services described in this note independent of APP/resident time (if applicable)  is 32 minutes.   Virl Diamond MD [facility] Personal pager: See Amion If unanswered, please page CCM On-call: #[phone]

## 2022-07-16 NOTE — Progress Notes (Signed)
eLink Physician-Brief Progress Note Patient Name: Alexander [name] DOB: 10-02-1980 MRN: [id number]   Date of Service  07/16/2022  HPI/Events of Note  42 year old male with a history of alcohol use who presented with acute pancreatitis and lactic acidosis with was initially treated on the floor but progressed to the ICU in the setting of his encephalopathy.  Tonight, he had an escalation in his oxygen requirements and increased wheezing was noted on examination.  eICU Interventions  Add on as needed SVNs.  Obtain chest radiograph.     Intervention Category Minor Interventions: Routine modifications to care plan (e.g. PRN medications for pain, fever)  Tayshon Winker 07/16/2022, 4:12 AM

## 2022-07-16 NOTE — Progress Notes (Signed)
Pt has been on room air for about 24 hours. Around 0350 pt noted to have desat to 88-87%. Pt placed back on 2L [place] with improved O2 sat up to 94%. Breath sounds with bilateral inspiratory wheezing. Pt complaining of feeling short of breath.  Reached out to [facility] for PRN breathing treatment.

## 2022-07-16 NOTE — Progress Notes (Signed)
Amlodipine ordered for elevated BP

## 2022-07-17 ENCOUNTER — Inpatient Hospital Stay (HOSPITAL_COMMUNITY): Payer: Medicaid Other

## 2022-07-17 DIAGNOSIS — K7010 Alcoholic hepatitis without ascites: Secondary | ICD-10-CM

## 2022-07-17 DIAGNOSIS — I10 Essential (primary) hypertension: Secondary | ICD-10-CM | POA: Insufficient documentation

## 2022-07-17 DIAGNOSIS — K8581 Other acute pancreatitis with uninfected necrosis: Secondary | ICD-10-CM

## 2022-07-17 DIAGNOSIS — R0609 Other forms of dyspnea: Secondary | ICD-10-CM

## 2022-07-17 DIAGNOSIS — D649 Anemia, unspecified: Secondary | ICD-10-CM

## 2022-07-17 LAB — ECHOCARDIOGRAM COMPLETE
Area-P 1/2: 3.66 cm2
Calc EF: 43.9 %
Height: 67 in
S' Lateral: 2.9 cm
Single Plane A2C EF: 40.4 %
Single Plane A4C EF: 47.5 %
Weight: 2634.94 oz

## 2022-07-17 LAB — HEPATIC FUNCTION PANEL
ALT: 36 U/L (ref 0–44)
AST: 44 U/L — ABNORMAL HIGH (ref 15–41)
Albumin: 2.1 g/dL — ABNORMAL LOW (ref 3.5–5.0)
Alkaline Phosphatase: 122 U/L (ref 38–126)
Bilirubin, Direct: 0.4 mg/dL — ABNORMAL HIGH (ref 0.0–0.2)
Indirect Bilirubin: 0.8 mg/dL (ref 0.3–0.9)
Total Bilirubin: 1.2 mg/dL (ref 0.3–1.2)
Total Protein: 5.6 g/dL — ABNORMAL LOW (ref 6.5–8.1)

## 2022-07-17 LAB — BASIC METABOLIC PANEL
Anion gap: 12 (ref 5–15)
BUN: 5 mg/dL — ABNORMAL LOW (ref 6–20)
CO2: 25 mmol/L (ref 22–32)
Calcium: 8.2 mg/dL — ABNORMAL LOW (ref 8.9–10.3)
Chloride: 99 mmol/L (ref 98–111)
Creatinine, Ser: 0.66 mg/dL (ref 0.61–1.24)
GFR, Estimated: >60 mL/min (ref >60)
Glucose, Bld: 93 mg/dL (ref 70–99)
Potassium: 3.1 mmol/L — ABNORMAL LOW (ref 3.5–5.1)
Sodium: 136 mmol/L (ref 135–145)

## 2022-07-17 LAB — GLUCOSE, CAPILLARY
Glucose-Capillary: 103 mg/dL — ABNORMAL HIGH (ref 70–99)
Glucose-Capillary: 111 mg/dL — ABNORMAL HIGH (ref 70–99)
Glucose-Capillary: 114 mg/dL — ABNORMAL HIGH (ref 70–99)
Glucose-Capillary: 91 mg/dL (ref 70–99)
Glucose-Capillary: 96 mg/dL (ref 70–99)
Glucose-Capillary: 96 mg/dL (ref 70–99)

## 2022-07-17 LAB — LACTIC ACID, PLASMA
Lactic Acid, Venous: 1 mmol/L (ref 0.5–1.9)
Lactic Acid, Venous: 1.2 mmol/L (ref 0.5–1.9)

## 2022-07-17 LAB — PHOSPHORUS: Phosphorus: 2.8 mg/dL (ref 2.5–4.6)

## 2022-07-17 LAB — MAGNESIUM: Magnesium: 1.6 mg/dL — ABNORMAL LOW (ref 1.7–2.4)

## 2022-07-17 MED ORDER — MAGNESIUM SULFATE 2 GM/50ML IV SOLN
2.0000 g | Freq: Once | INTRAVENOUS | Status: AC
  Administered 2022-07-17: 2 g via INTRAVENOUS
  Filled 2022-07-17: qty 50

## 2022-07-17 MED ORDER — POTASSIUM CHLORIDE CRYS ER 20 MEQ PO TBCR
40.0000 meq | EXTENDED_RELEASE_TABLET | ORAL | Status: AC
  Administered 2022-07-17 (×2): 40 meq via ORAL
  Filled 2022-07-17 (×2): qty 2

## 2022-07-17 MED ORDER — LIDOCAINE 5 % EX PTCH
1.0000 | MEDICATED_PATCH | CUTANEOUS | Status: DC
  Administered 2022-07-17 – 2022-07-18 (×2): 1 via TRANSDERMAL
  Filled 2022-07-17 (×2): qty 1

## 2022-07-17 MED ORDER — IOHEXOL 350 MG/ML SOLN
75.0000 mL | Freq: Once | INTRAVENOUS | Status: AC | PRN
  Administered 2022-07-17: 75 mL via INTRAVENOUS

## 2022-07-17 MED ORDER — FUROSEMIDE 10 MG/ML IJ SOLN
40.0000 mg | Freq: Once | INTRAMUSCULAR | Status: AC
  Administered 2022-07-17: 40 mg via INTRAVENOUS
  Filled 2022-07-17: qty 4

## 2022-07-17 MED ORDER — POLYETHYLENE GLYCOL 3350 17 G PO PACK
17.0000 g | PACK | Freq: Two times a day (BID) | ORAL | Status: DC
  Administered 2022-07-17 – 2022-07-21 (×5): 17 g via ORAL
  Filled 2022-07-17 (×11): qty 1

## 2022-07-17 NOTE — Progress Notes (Signed)
Inpatient Rehab Admissions Coordinator:    Pt at CT. Per nursing, he is not trying to leave AMA anymore. I spoke with Pt.'s mother Tyler Aas, who states that he could d/c to her home in [place] if needed. She works but state her husband is home all the time and could provide supervision to light assist if needed. She requested that I go over cost estimate with her at a later time.   Megan Salon, MS, CCC-SLP Rehab Admissions Coordinator  [phone] (celll) [phone] (office)

## 2022-07-17 NOTE — Assessment & Plan Note (Addendum)
Likely secondary to electrolyte abnormalities from recent GI vomiting. Most recent QTC 480s. 

## 2022-07-17 NOTE — Assessment & Plan Note (Signed)
Noted on CT abdomen/pelvis. Not previously diagnosed. Low likelihood of SBP given that patient doesn't have white count or signs of bacterial infection, though we will need to get a sample when patient can tolerate. -Plan to repeat right upper quadrant ultrasound when clinically improved with tapping of fluid if possible 

## 2022-07-17 NOTE — Assessment & Plan Note (Signed)
Multiple blood pressures have been high.  Amlodipine was started by CCM.  Will take a couple days to take effect. - IV Lasix 40 mg for blood pressure and fluid overload

## 2022-07-17 NOTE — Assessment & Plan Note (Addendum)
Patient drinks 8oz of gin anywhere from 5-10 times daily, last drink was 6/12 PM. Needed precedex and transferred to ICU. Now on phenobarb taper and tolerating well, CIWA 2. - CIWA protocol - Continue phenobarb - Monitor BMPs - Cardiac telemetry - Thiamine 100 mg, Folic acid 1 mg,  MVI daily

## 2022-07-17 NOTE — Assessment & Plan Note (Addendum)
Hovering around 8-9 last couple checks. Likely due to hemodilution from fluid overload. - Continue to monitor - Consider iron studies when not acutely ill - AM CBC

## 2022-07-17 NOTE — Progress Notes (Addendum)
Progress Note  Primary GI: Unassigned (seen by Dr. Barron Alvine initially)  LOS: 6 days   Chief Complaint:Alcoholic pancreatitis with necrosis   Subjective   GI has been reconsulted due to worsening abdominal pain and progressive pancreatitis.    CT abdomen pelvis with contrast today shows severe acute pancreatitis with necrosis involving neck, body, proximal tail of pancreas.  Mildly complex peripancreatic fluid identified extending into the bilateral retroperitoneum, small bowel mesentery, and lesser sac.  No well-defined, peripheral enhancing fluid collection identified.  New small bilateral pleural effusions.  Ileus.  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  Patient states his abdominal pain has been persistent since first day of admission and has not gotten better or worse.  Reports he has generalized abdominal pain.  States the thought of food makes him nauseous.  Has had no vomiting.  Has not eaten anything since initial admission 6/13.  Patient states he has been passing gas regularly and per RN note he has been having regular bowel movements.  History of alcohol use.  750 mL bottle of gin typically last him 1.5 days.  Last drink was 6/12 PM  See PCCM note for further details   Objective   Vital signs in last 24 hours: Temp:  [98.2 F (36.8 C)-99 F (37.2 C)] 98.3 F (36.8 C) (06/19 1110) Pulse Rate:  [105-119] 111 (06/19 1110) Resp:  [15-20] 18 (06/19 1110) BP: (140-157)/(100-108) 153/105 (06/19 1110) SpO2:  [92 %-99 %] 99 % (06/18 1940) Last BM Date : 07/17/22 Last BM recorded by nurses in past 5 days Stool Type: Type 5 (Soft blobs with clear-cut edges) (07/16/2022  4:00 PM)  General:   male in no acute distress Heart: Tachycardic, regular rhythm Pulm: Clear anteriorly; no wheezing Abdomen: Diffuse generalized tenderness, some abdominal distention, soft.  Normal active bowel sounds. Neurologic:  Alert and  oriented x4;  No focal deficits.   Psych:  Cooperative. Normal mood and affect.  Intake/Output from previous day: 06/18 0701 - 06/19 0700 In: 768.7 [P.O.:540; I.V.:30; IV Piggyback:198.7] Out: 3600 [Urine:3600] Intake/Output this shift: Total I/O In: 0  Out: 450 [Urine:450]  Studies/Results: CT ABDOMEN PELVIS W CONTRAST  Result Date: 07/17/2022 CLINICAL DATA:  Acute abdominal pain. EXAM: CT ABDOMEN AND PELVIS WITH CONTRAST TECHNIQUE: Multidetector CT imaging of the abdomen and pelvis was performed using the standard protocol following bolus administration of intravenous contrast. RADIATION DOSE REDUCTION: This exam was performed according to the departmental dose-optimization program which includes automated exposure control, adjustment of the mA and/or kV according to patient size and/or use of iterative reconstruction technique. CONTRAST:  75mL OMNIPAQUE IOHEXOL 350 MG/ML SOLN COMPARISON:  CTA chest, abdomen and pelvis from 07/11/2022 FINDINGS: Lower chest: New small bilateral pleural effusions with overlying compressive type atelectasis. Ground-glass and airspace densities noted within the anterior right lower lobe. Hepatobiliary: No focal liver abnormality is seen. No gallstones, gallbladder wall thickening, or biliary dilatation. Pancreas: Again seen are changes of severe, acute pancreatitis with necrosis involving the neck, body and proximal tail of pancreas. No main duct dilatation or enhancing mass identified. Progressive, mildly complex peripancreatic fluid is identified extending into bilateral retroperitoneum, small bowel mesentery, and lesser sac. Fluid extension along the posterior wall of the stomach into the left upper quadrant noted. No well-defined, peripherally enhancing fluid collection identified at this time to suggest mature pseudocyst or drainable abscess. Spleen: Normal in size without focal abnormality. Adrenals/Urinary Tract: Normal adrenal glands. No nephrolithiasis, hydronephrosis or suspicious mass.  Urinary bladder  is unremarkable. Stomach/Bowel: The stomach appears nondistended. Mildly increased caliber small bowel loops with air-fluid levels identified which measure up to normal caliber of the colon with gas and stool noted up to the level of the rectum. Vascular/Lymphatic: Aortic atherosclerosis. Portal vein, portal venous confluence and SMV remain patent. 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 Multiple scratch set small, non pathologically enlarged abdominal lymph nodes are likely reactive. Reproductive: Prostate is unremarkable. Other: No signs of pneumoperitoneum. Musculoskeletal: No acute or significant osseous findings. IMPRESSION: 1. Again seen are changes of severe, acute pancreatitis with necrosis involving the neck, body and proximal tail of pancreas. Progressive, mildly complex peripancreatic fluid is identified extending into bilateral retroperitoneum, small bowel mesentery, and lesser sac. No well-defined, peripherally enhancing fluid collection identified at this time to suggest mature pseudocyst or drainable abscess. 2. New small bilateral pleural effusions with overlying compressive type atelectasis. Ground-glass and airspace densities noted within the anterior right lower lobe which may reflect atelectasis or pneumonia. 3. Mildly increased caliber small bowel loops with air-fluid levels identified which measure up to normal caliber of the colon with gas and stool noted up to the level of the rectum. Findings are favored to represent ileus. 4. 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 5.  Aortic Atherosclerosis (ICD10-I70.0). Electronically Signed   By: Signa Kell M.D.   On: 07/17/2022 13:58   DG CHEST PORT 1 VIEW  Result Date: 07/16/2022 CLINICAL DATA:  Provided history: Hypoxia. EXAM: PORTABLE CHEST 1 VIEW COMPARISON:  Chest radiograph 07/13/2022 and earlier. FINDINGS: The cardiomediastinal silhouette is unchanged. Basilar  predominant bilateral pulmonary opacities, progressed from the prior examination of 07/13/2022. Blunting of the lateral costophrenic angles. No evidence of pneumothorax. No acute osseous abnormality identified IMPRESSION: 1. Basilar predominant pulmonary opacities bilaterally, progressed from the prior examination of 07/13/2022. This is favored to reflect a combination of edema and atelectasis. Superimposed pneumonia cannot be excluded. 2. Blunting of the costophrenic angle, suggesting the presence of bilateral pleural effusions. Electronically Signed   By: Jackey Loge D.O.   On: 07/16/2022 08:08    Lab Results: Recent Labs    07/15/22 0013 07/16/22 0627  WBC 5.5 8.3  HGB 8.1* 9.0*  HCT 24.8* 26.8*  PLT 148* 191   BMET Recent Labs    07/15/22 1736 07/16/22 0627 07/17/22 0349  NA 135 136 136  K 3.5 3.0* 3.1*  CL 102 102 99  CO2 21* 24 25  GLUCOSE 167* 116* 93  BUN 11 7 5*  CREATININE 0.77 0.76 0.66  CALCIUM 7.1* 7.4* 8.2*   LFT Recent Labs    07/17/22 0347  PROT 5.6*  ALBUMIN 2.1*  AST 44*  ALT 36  ALKPHOS 122  BILITOT 1.2  BILIDIR 0.4*  IBILI 0.8   PT/INR Recent Labs    07/15/22 0013  LABPROT 14.1  INR 1.1     Scheduled Meds:  amLODipine  5 mg Oral Daily   amoxicillin-clavulanate  1 tablet Oral Q12H   Chlorhexidine Gluconate Cloth  6 each Topical Daily   enoxaparin (LOVENOX) injection  40 mg Subcutaneous Daily   folic acid  1 mg Oral Daily   lidocaine  1 patch Transdermal Q24H   multivitamin with minerals  1 tablet Oral Daily   nicotine  7 mg Transdermal Daily   pantoprazole  40 mg Oral QHS   phenobarbital  64.8 mg Oral Q8H   Followed by   [START ON 07/19/2022] phenobarbital  32.4 mg Oral Q8H  polyethylene glycol  17 g Oral Daily   prednisoLONE  40 mg Oral Q24H   thiamine  100 mg Oral Daily   Continuous Infusions:  sodium chloride     lactated ringers Stopped (07/16/22 1000)      Patient profile:   42 year old male with history of EtOH  disorder admitted for acute pancreatitis on 6/13   Impression/Plan:   Acute alcoholic pancreatitis with necrosis CT angio C/A/P 6/13 : Large amount of peripancreatic fluid consistent with acute pancreatitis. No PD dilation. Severe hepatic steatosis without mass or duct dilation. Otherwise normal-appearing GI tract.  initial lipase 1737 Lactic acid 1.0, improved from initial 8.5 on admission CT ab/pelvis w/contrast 6/19: Severe pancreatitis with necrosis involving neck, body, proximal tail of pancreas.  Peripancreatic fluid extending into bilateral retroperitoneum, small bowel mesentery, and lesser sac.  No well-defined fluid collection to suggest mature pseudocyst or drainable abscess.  New small bilateral pleural effusions.  Increased caliber of small bowel loops suggesting ileus.  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 -Consider meropenem (?) -Patient has had little to no oral intake since being admitted.  Consider administering tube feeds for adequate nutrition which can then has been shown to have secondary effect in improving pancreatitis -Continue pain control and supportive care -Would recommend against aggressive IVF since patient has struggled with fluid overload. However, needs adequate hydration. May consider IVF with close monitoring to prevent fluid overload  Alcoholic hepatitis on prednisolone 40 Mg with improvement in LFTs AST 44/ALT 36/alk phos 122 T Bili 1.2 Hepatitis panel was positive for hepatitis B  core IgM, HCV antibody negative.  Hepatitis B surface antibody reflects immune status -Thiamine, multivitamin, folate -Due for Lille score Saturday  Ileus per CT Magnesium 1.6 Phosphorus 2.8 Patient reports regularly passing gas and RN documents regular bowel movements. - Increase bowel regimen to scheduled miralax BID - Dulcolax  suppository - Continue to maintain magnesium above 2 and potassium at 4-4.5.   Anemia Hgb 9.0, MCV 101.5 Likely hemodilution in the setting of continuous IVF.  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 - Continue daily CBC and transfuse as needed to maintain HGB > 7   Chandra Feger M Jodel Mayhall  07/17/2022, 4:13 PM

## 2022-07-17 NOTE — Progress Notes (Signed)
Pt states "I want to go home (AMA), the doctor can call me", currently trying to find a ride home sitting in the recliner in the corner of the room

## 2022-07-17 NOTE — Assessment & Plan Note (Addendum)
Due to volume overload requiring BiPAP and transfer to ICU. Now on RA and ambulating well.  Given fluid overload, will also obtain echocardiogram to assess cardiac function. -Monitor O2 status -Encourage PO intake -IV Lasix 40 mg once, reassess need -Follow-up echo

## 2022-07-17 NOTE — Assessment & Plan Note (Addendum)
And vomiting improved, though patient continues to have severe back and stomach pain. Initially CT abdomen/pelvis on admission showed signs consistent with acute pancreatitis.  Lipid panel without hypertriglyceridemia.  Right upper quadrant ultrasound with fatty liver but otherwise normal.  Given continued pain, will repeat CT abdomen pelvis and obtain lactic acid. - CTAP, LA - Cardiac telemetry  - Vitals per routine - KVO LR - Percocet for severe pain - Miralax daily PRN for bowel regimen - Regular diet now

## 2022-07-17 NOTE — Progress Notes (Signed)
Daily Progress Note Intern Pager: [phone]  Patient name: Alexander [name] Medical record number: [id number] Date of birth: 04-13-1980 Age: 42 y.o. Gender: male  Primary Care Provider: Patient, No Pcp Per Consultants: CCM in ER, GI (S/O) Code Status: Full   Pt Overview and Major Events to Date:  6/13: Admitted 6/15: admitted to ICU, placed on precedex/BiPAP 6/19: back to FMTS on room air, phenobarb taper   Assessment and Plan: Alexander [name] is a 42 y.o. male who presented with abdominal pain, nausea and vomiting in the setting of acute pancreatitis and alcohol use continuing to be managed for pancreatitis and alcohol withdrawal. 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  Hospital Problem List      Hospital     * (Principal) Acute pancreatitis     And vomiting improved, though patient continues to have severe back and  stomach pain. Initially CT abdomen/pelvis on admission showed signs  consistent with acute pancreatitis.  Lipid panel without  hypertriglyceridemia.  Right upper quadrant ultrasound with fatty liver  but otherwise normal.  Given continued pain, will repeat CT abdomen pelvis  and obtain lactic acid. - CTAP, LA - Cardiac telemetry  - Vitals per routine - KVO LR - Percocet for severe pain - Miralax daily PRN for bowel regimen - Regular diet now        Alcohol withdrawal (HCC)     Patient drinks 8oz of gin anywhere from 5-10 times daily, last drink  was 6/12 PM. Needed precedex and transferred to ICU. Now on phenobarb  taper and tolerating well, CIWA 2. - CIWA protocol - Continue phenobarb - Monitor BMPs - Cardiac telemetry - Thiamine 100 mg, Folic acid 1 mg,  MVI daily        RESOLVED: Acute lactic acidosis     Lactic acid 8.5 on admission, patient received 3 L of NS.  Repeat  lactic acid improving at 3.2.  Reassured by improvement, less likely GI  necrosis. - IVF with LR at 250 mL/h        Anemia     Hovering  around 8-9 last couple checks. Likely due to hemodilution from  fluid overload. - Continue to monitor - Consider iron studies when not acutely ill - AM CBC        Prolonged QT interval     Likely secondary to electrolyte abnormalities from recent GI vomiting.  Most recent QTC 480s.        Hepatic steatosis     Evidence of severe hepatic steatosis incidentally found on CT  abdomen/pelvis.  Right upper quadrant sound with confirmatory findings.   Patient at risk for liver pathology given elevated liver enzymes in the  setting of chronic heavy alcoholism and also found to have small volume of  ascites on imaging as well.  Hepatitis panel was positive for hepatitis B  core IgM, HCV antibody negative.  Hepatitis B surface antibody reflects  immune status. LFTs coming down after fluids for pancreatitis. - Consider tapping ascites as above -Sinew prednisolone for alcoholic hepatitis, repeat Lille score in 7 days  to assess efficacy        Ascites     Noted on CT abdomen/pelvis. Not previously diagnosed. Low likelihood of  SBP given that patient doesn't have white count or signs of bacterial  infection, though we will need to get a sample when patient can tolerate. -Plan to repeat right upper quadrant ultrasound when clinically improved  with tapping  of fluid if possible        Electrolyte abnormality     Patient's electrolytes remain a concern in the setting of GI loss and  saponification with pancreatitis and decreased PO.  K3.1, magnesium 1.6,  calcium 8.2, phos 2.8. -AM CMP, Mg -Continue supplementation as necessary        Hypokalemia     Hypomagnesemia     Acute respiratory failure with hypoxia (HCC)     Due to volume overload requiring BiPAP and transfer to ICU. Now on RA  and ambulating well.  Given fluid overload, will also obtain  echocardiogram to assess cardiac function. -Monitor O2 status -Encourage PO intake -IV Lasix 40 mg once, reassess need -Follow-up echo         Hypocalcemia     Hypertension     Multiple blood pressures have been high.  Amlodipine was started by  CCM.  Will take a couple days to take effect. - IV Lasix 40 mg for blood pressure and fluid overload       FEN/GI: Has not taken in any food in 6 days, consider core track insertion for feeds as long as no contraindication on CTAP PPx: Lovenox Dispo: Pending clinical improvement  Subjective:  Continues to not have any improvement.  Abdominal and back pain remains severe.  No history of IV drug use.  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  Has not been able to eat.  Last stooled 2 days ago.  Objective: Temp:  [98.2 F (36.8 C)-99 F (37.2 C)] 98.3 F (36.8 C) (06/19 1110) Pulse Rate:  [97-119] 111 (06/19 1110) Resp:  [14-21] 18 (06/19 1110) BP: (140-157)/(100-108) 153/105 (06/19 1110) SpO2:  [91 %-99 %] 99 % (06/18 1940) Physical Exam: General: Lying in bed, uncomfortable appearing, no acute distress Cardiovascular: Tachycardic, no murmurs rubs or gallops Respiratory/back: Breathing comfortably on room air, severe tenderness to palpation along spinal column throughout Abdomen: Severely tender to light palpation throughout, nondistended, active bowel sounds Extremities: Moves all extremities grossly equally in bed  Laboratory: Most recent CBC Lab Results  Component Value Date   WBC 8.3 07/16/2022   HGB 9.0 (L) 07/16/2022   HCT 26.8 (L) 07/16/2022   MCV 101.5 (H) 07/16/2022   PLT 191 07/16/2022   Most recent BMP    Latest Ref Rng & Units 07/17/2022    3:49 AM  BMP  Glucose 70 - 99 mg/dL 93   BUN 6 - 20 mg/dL 5   Creatinine 4.09 - 8.11 mg/dL 9.14   Sodium 782 - 956 mmol/L 136   Potassium 3.5 - 5.1 mmol/L 3.1   Chloride 98 - 111 mmol/L 99   CO2 22 - 32 mmol/L 25   Calcium 8.9 - 10.3 mg/dL 8.2    Imaging/Diagnostic Tests: CXR IMPRESSION: 1. Basilar predominant pulmonary opacities bilaterally, progressed from the  prior examination of 07/13/2022. This is favored to reflect a combination of edema and atelectasis. Superimposed pneumonia cannot be excluded. 2. Blunting of the costophrenic angle, suggesting the presence of bilateral pleural effusions.  Evette Georges, MD 07/17/2022, 12:48 PM PGY-1, [facility] FPTS Intern pager: [phone], text pages welcome Secure chat group [facility] Teaching Service

## 2022-07-17 NOTE — Assessment & Plan Note (Addendum)
Evidence of severe hepatic steatosis incidentally found on CT abdomen/pelvis.  Right upper quadrant sound with confirmatory findings.  Patient at risk for liver pathology given elevated liver enzymes in the setting of chronic heavy alcoholism and also found to have small volume of ascites on imaging as well.  Hepatitis panel was positive for hepatitis B core IgM, HCV antibody negative.  Hepatitis B surface antibody reflects immune status. LFTs coming down after fluids for pancreatitis. - Consider tapping ascites as above -Sinew prednisolone for alcoholic hepatitis, repeat Lille score in 7 days to assess efficacy

## 2022-07-17 NOTE — Progress Notes (Signed)
FMTS Interim Progress Note  S: Doing well, pain improving in epigastrium.   O: BP (!) 138/100 (BP Location: Left Arm)   Pulse (!) 111   Temp 98.7 F (37.1 C) (Oral)   Resp 20   Ht 5\' 7"  (1.702 m)   Wt 74.7 kg   SpO2 99%   BMI 25.79 kg/m   General: NAD, pleasant, able to participate in exam Respiratory: No respiratory distress Skin: warm and dry, no rashes noted Psych: Normal affect and mood   A/P: GI recs appreciated Continue current plan Heme/onc in AM   Alfredo Martinez, MD 07/17/2022, 10:35 PM PGY-2, [facility] Service pager [phone]

## 2022-07-17 NOTE — Assessment & Plan Note (Signed)
Patient's electrolytes remain a concern in the setting of GI loss and saponification with pancreatitis and decreased PO.  K3.1, magnesium 1.6, calcium 8.2, phos 2.8. -AM CMP, Mg -Continue supplementation as necessary

## 2022-07-17 NOTE — Progress Notes (Signed)
  Echocardiogram 2D Echocardiogram has been performed.  Alexander [name] 07/17/2022, 4:00 PM

## 2022-07-18 DIAGNOSIS — I820 Budd-Chiari syndrome: Secondary | ICD-10-CM | POA: Diagnosis not present

## 2022-07-18 DIAGNOSIS — E43 Unspecified severe protein-calorie malnutrition: Secondary | ICD-10-CM

## 2022-07-18 LAB — BASIC METABOLIC PANEL
Anion gap: 10 (ref 5–15)
BUN: 5 mg/dL — ABNORMAL LOW (ref 6–20)
CO2: 26 mmol/L (ref 22–32)
Calcium: 8.3 mg/dL — ABNORMAL LOW (ref 8.9–10.3)
Chloride: 97 mmol/L — ABNORMAL LOW (ref 98–111)
Creatinine, Ser: 0.7 mg/dL (ref 0.61–1.24)
GFR, Estimated: >60 mL/min (ref >60)
Glucose, Bld: 118 mg/dL — ABNORMAL HIGH (ref 70–99)
Potassium: 3.2 mmol/L — ABNORMAL LOW (ref 3.5–5.1)
Sodium: 133 mmol/L — ABNORMAL LOW (ref 135–145)

## 2022-07-18 LAB — MAGNESIUM: Magnesium: 1.6 mg/dL — ABNORMAL LOW (ref 1.7–2.4)

## 2022-07-18 LAB — GLUCOSE, CAPILLARY: Glucose-Capillary: 116 mg/dL — ABNORMAL HIGH (ref 70–99)

## 2022-07-18 MED ORDER — HYDROMORPHONE HCL 1 MG/ML IJ SOLN
1.0000 mg | INTRAMUSCULAR | Status: DC | PRN
  Administered 2022-07-18 – 2022-07-21 (×8): 1 mg via INTRAVENOUS
  Filled 2022-07-18 (×8): qty 1

## 2022-07-18 MED ORDER — POTASSIUM CHLORIDE 20 MEQ PO PACK
40.0000 meq | PACK | ORAL | Status: AC
  Administered 2022-07-18 (×2): 40 meq via ORAL
  Filled 2022-07-18 (×2): qty 2

## 2022-07-18 MED ORDER — OXYCODONE-ACETAMINOPHEN 5-325 MG PO TABS
1.0000 | ORAL_TABLET | ORAL | Status: DC | PRN
  Administered 2022-07-18 – 2022-07-21 (×9): 2 via ORAL
  Filled 2022-07-18 (×9): qty 2

## 2022-07-18 MED ORDER — LIDOCAINE 5 % EX PTCH
2.0000 | MEDICATED_PATCH | CUTANEOUS | Status: DC
  Administered 2022-07-19 – 2022-07-23 (×5): 2 via TRANSDERMAL
  Filled 2022-07-18 (×5): qty 2

## 2022-07-18 MED ORDER — MAGNESIUM SULFATE 2 GM/50ML IV SOLN
2.0000 g | Freq: Once | INTRAVENOUS | Status: AC
  Administered 2022-07-18: 2 g via INTRAVENOUS
  Filled 2022-07-18: qty 50

## 2022-07-18 MED ORDER — LIDOCAINE 5 % EX PTCH: 1.0000 | MEDICATED_PATCH | CUTANEOUS | Status: DC

## 2022-07-18 MED ORDER — BOOST / RESOURCE BREEZE PO LIQD CUSTOM
1.0000 | Freq: Three times a day (TID) | ORAL | Status: DC
  Administered 2022-07-18 – 2022-07-22 (×8): 1 via ORAL

## 2022-07-18 NOTE — Assessment & Plan Note (Signed)
Noted on CT abdomen/pelvis. Not previously diagnosed. Low likelihood of SBP given that patient doesn't have white count or signs of bacterial infection, though we will need to get a sample when patient can tolerate. -Plan to repeat right upper quadrant ultrasound when clinically improved with tapping of fluid if possible

## 2022-07-18 NOTE — Assessment & Plan Note (Signed)
Patient drinks 8oz of gin anywhere from 5-10 times daily, last drink was 6/12 PM. Needed precedex and transferred to ICU. Now on phenobarb taper and tolerating well, CIWA 2>0. - CIWA protocol - Continue phenobarb - Monitor BMPs - Cardiac telemetry - Thiamine 100 mg, Folic acid 1 mg,  MVI daily

## 2022-07-18 NOTE — Progress Notes (Signed)
Initial Nutrition Assessment  DOCUMENTATION CODES:   Severe malnutrition in context of acute illness/injury  INTERVENTION:   - 48-hour calorie count, RD to follow up tomorrow with day 1 results  - Boost Breeze po TID, each supplement provides 250 kcal and 9 grams of protein  - Downgrade diet to GI soft (low fiber)  Once post-pyloric Cortrak placed on 07/19/22 (with tip at Ligament of Treitz) and placement confirmed with x-ray, recommend initiation of enteral nutrition: - Start Vital 1.5 @ 20 ml/hr and advance rate by 10 ml q 12 hours to goal rate of 60 ml/hr (1440 ml/day)  Recommended tube feeding regimen at goal rate would provide 2160 kcal, 97 grams of protein, and 1100 ml of H2O.  Once enteral nutrition initiated, monitor magnesium, potassium, and phosphorus q 12 hours for at least 6 occurrences. MD to replete as needed as pt is at risk for refeeding syndrome given severe acute malnutrition, EtOH abuse, minimal PO x 7 days.  - Continue MVI with minerals and folic acid daily  - Once enteral nutrition initiated, continue thiamine 100 mg daily for at least 5 additional days given refeeding risk  NUTRITION DIAGNOSIS:   Severe Malnutrition related to acute illness (pancreatitis) as evidenced by mild fat depletion, moderate muscle depletion, energy intake < or equal to 50% for > or equal to 5 days.  GOAL:   Patient will meet greater than or equal to 90% of their needs  MONITOR:   PO intake, Supplement acceptance, Labs, Weight trends, I & O's, TF tolerance  REASON FOR ASSESSMENT:   Consult Assessment of nutrition requirement/status, Calorie Count  ASSESSMENT:   42 year old male who presented to the ED on 6/13 with abdominal and chest pain, N/V. PMH of EtOH abuse. Pt admitted with severe pancreatitis.  06/13 - admit, NPO 06/14 - clear liquids 06/15 - transferred to ICU, placed on precedex and BiPAP, NPO 06/17 - regular diet 06/19 - transferred back to [facility]MTS, heart healthy  diet  Consult received for nutrition assessment and calorie count. Pt admitted with severe pancreatitis. CT abdomen/pelvis yesterday showed severe acute pancreatitis with necrosis involving the neck, body, and proximal tail of the pancreas as well as mildly complex peripancreatic fluid collection extending into the bilateral retroperitoneum, small bowel mesentery, and lesser sac. CT also showed increased caliber of small bowel loops suggesting ileus.  Per review of notes, pt drinks 8 oz of gin between 5-10 times a day. Pt reported to GI yesterday that he has not eaten anything since initial admission on 6/13.  Met with pt at bedside. Pt was ordering meals at time of RD visit but reported to RD that he likely wouldn't be able to eat much of what he ordered. Pt confirmed to RD that he has barely eaten anything solid since being admitted to the hospital. Pt reports that he has been drinking water and some juice. Pt shares that he ate about 50% of a pork chop last night. He felt nauseous immediately afterward. Pt endorses abdominal pain that is worse after eating and intermittent nausea. He reports that he has not vomited since his first full day being admitted to the hospital.  Pt reports that he does not eat much at baseline. Pt confirms EtOH use at home. Pt typically eats 2-3 small meals daily. A meal may include chicken salad and crackers or lunch meal and cheese or ramen noodles.  RD provided pt with a Boost Breeze at time of visit. Pt consumed about 1/3 of the supplement  and reported almost immediate abdominal pain. He states that he likes the taste and is willing to drink these.  At first, pt adamantly stating that he does not want a tube in his nose. RD explained reason for Cortrak as well as placement process. All questions answered. By end of discussion, pt was amenable to considering Cortrak placement tomorrow unless PO intake and symptoms dramatically improve. Discussed the above with FMTS  team.  Pt would benefit from Cortrak placement with tip at Ligament of Treitz given abdominal pain and nausea.  Pt meets criteria for severe acute malnutrition based on PO intake and NFPE. Pt reports that he has a lot of extra fluid on him which makes it difficult to know if he has lost weight. He states that his UBW is 135 lbs.  Pt is at high refeeding risk. Pt already on thiamine. Once Cortrak placed and placement confirmed, will plan to start tube feeds at trickle rate and slowly advance to goal while monitoring refeeding labs.  Pt with non-pitting edema to BUE.  Admit weight: 67 kg Current weight: 74.7 kg  Meal Completion: 0-15%  Medications reviewed and include: Boost Breeze TID, folic acid, MVI with minerals daily, protonix, phenobarbital taper, miralax, prednisolone, thiamine  Labs reviewed: sodium 133, potassium 3.2 (pt received klor-con mEq x 2), chloride 97, magnesium 1.6 (pt received IV magnesium sulfate 2 grams x 1), hemoglobin 9.0 CBG's: 96-116 x 24 hours  UOP: 2950 ml x 24 hours I/O's: +6.5 L since admit  NUTRITION - FOCUSED PHYSICAL EXAM:  Flowsheet Row Most Recent Value  Orbital Region Mild depletion  Upper Arm Region Moderate depletion  Thoracic and Lumbar Region Mild depletion  Buccal Region No depletion  Temple Region No depletion  Clavicle Bone Region Moderate depletion  Clavicle and Acromion Bone Region Moderate depletion  Scapular Bone Region Moderate depletion  Dorsal Hand Mild depletion  Patellar Region Mild depletion  Anterior Thigh Region Moderate depletion  Posterior Calf Region No depletion  Edema (RD Assessment) Mild  Hair Reviewed  Eyes Reviewed  Mouth Reviewed  Skin Reviewed  Nails Reviewed       Diet Order:   Diet Order             DIET SOFT Room service appropriate? Yes with Assist; Fluid consistency: Thin  Diet effective now                   EDUCATION NEEDS:   Education needs have been addressed  Skin:  Skin  Assessment: Reviewed RN Assessment  Last BM:  07/18/22 medium type 5  Height:   Ht Readings from Last 1 Encounters:  07/11/22 5\' 7"  (1.702 m)    Weight:   Wt Readings from Last 1 Encounters:  07/16/22 74.7 kg    Ideal Body Weight:  67.3 kg  BMI:  Body mass index is 25.79 kg/m.  Estimated Nutritional Needs:   Kcal:  2100-2300  Protein:  90-105 grams  Fluid:  >2.0 L    Mertie Clause, MS, RD, LDN Inpatient Clinical Dietitian Please see [facility] for contact information.

## 2022-07-18 NOTE — Progress Notes (Addendum)
Progress Note  Primary GI: Unassigned (initially see by Dr. Barron Alvine)  LOS: 7 days   Chief Complaint: alcoholic pancreatitis with necrosis   Subjective   Patient states he has continued to have abdominal pain. Has had two bowel movements that were nonbloody and runny. Was able to walk the halls this morning with PT and has been able to keep solid food down. Some nausea, no vomiting.   Objective   Vital signs in last 24 hours: Temp:  [98.1 F (36.7 C)-99.4 F (37.4 C)] 98.1 F (36.7 C) (06/20 1126) Resp:  [20] 20 (06/20 0724) BP: (138-164)/(94-107) 138/95 (06/20 1126) SpO2:  [98 %-99 %] 98 % (06/20 1126) Last BM Date : 07/18/22 Last BM recorded by nurses in past 5 days Stool Type: Type 5 (Soft blobs with clear-cut edges) (07/18/2022  8:25 AM)  General:   male in no acute distress  Heart:  tachycardic and rhythm; no murmurs Pulm: Clear anteriorly; no wheezing Abdomen: soft, nondistended, normal bowel sounds in all quadrants.epigastric tenderness. Extremities:  No edema Neurologic:  Alert and  oriented x4;  No focal deficits.  Psych:  Cooperative. Normal mood and affect.  Intake/Output from previous day: 06/19 0701 - 06/20 0700 In: 0  Out: 2950 [Urine:2950] Intake/Output this shift: Total I/O In: 240 [P.O.:240] Out: 125 [Urine:125]  Studies/Results: ECHOCARDIOGRAM COMPLETE  Result Date: 07/17/2022    ECHOCARDIOGRAM REPORT   Patient Name:   Alexander [name] Date of Exam: 07/17/2022 Medical Rec #:  [id number]     Height:       67.0 in Accession #:    [id number]    Weight:       164.7 lb Date of Birth:  Mar 08, 1980     BSA:          1.862 m Patient Age:    42 years      BP:           153/105 mmHg Patient Gender: M             HR:           101 bpm. Exam Location:  Inpatient Procedure: 2D Echo, Cardiac Doppler and Color Doppler Indications:    Dyspnea  History:        Patient has no prior history of Echocardiogram examinations.                 Risk Factors:Hypertension. Alcohol  withdrawal, ETOH.  Sonographer:    Milda Smart Referring Phys: [id number] MARGARET E PRAY IMPRESSIONS  1. Left ventricular ejection fraction, by estimation, is 55 to 60%. The left ventricle has normal function. The left ventricle has no regional wall motion abnormalities. There is mild concentric left ventricular hypertrophy. Left ventricular diastolic parameters are consistent with Grade I diastolic dysfunction (impaired relaxation).  2. Right ventricular systolic function is normal. The right ventricular size is normal. Tricuspid regurgitation signal is inadequate for assessing PA pressure.  3. The mitral valve is normal in structure. No evidence of mitral valve regurgitation. No evidence of mitral stenosis.  4. The aortic valve is normal in structure. Aortic valve regurgitation is not visualized. No aortic stenosis is present.  5. Aortic dilatation noted. There is mild dilatation of the ascending aorta, measuring 40 mm.  6. The inferior vena cava is normal in size with greater than 50% respiratory variability, suggesting right atrial pressure of 3 mmHg. FINDINGS  Left Ventricle: Left ventricular ejection fraction, by estimation, is 55 to 60%. The left ventricle has  normal function. The left ventricle has no regional wall motion abnormalities. The left ventricular internal cavity size was normal in size. There is  mild concentric left ventricular hypertrophy. Left ventricular diastolic parameters are consistent with Grade I diastolic dysfunction (impaired relaxation). Normal left ventricular filling pressure. Right Ventricle: The right ventricular size is normal. No increase in right ventricular wall thickness. Right ventricular systolic function is normal. Tricuspid regurgitation signal is inadequate for assessing PA pressure. Left Atrium: Left atrial size was normal in size. Right Atrium: Right atrial size was normal in size. Pericardium: There is no evidence of pericardial effusion. Mitral Valve: The mitral  valve is normal in structure. No evidence of mitral valve regurgitation. No evidence of mitral valve stenosis. Tricuspid Valve: The tricuspid valve is normal in structure. Tricuspid valve regurgitation is not demonstrated. No evidence of tricuspid stenosis. Aortic Valve: The aortic valve is normal in structure. Aortic valve regurgitation is not visualized. No aortic stenosis is present. Pulmonic Valve: The pulmonic valve was normal in structure. Pulmonic valve regurgitation is trivial. No evidence of pulmonic stenosis. Aorta: Aortic dilatation noted. There is mild dilatation of the ascending aorta, measuring 40 mm. Venous: The inferior vena cava is normal in size with greater than 50% respiratory variability, suggesting right atrial pressure of 3 mmHg. IAS/Shunts: No atrial level shunt detected by color flow Doppler.  LEFT VENTRICLE PLAX 2D LVIDd:         4.10 cm     Diastology LVIDs:         2.90 cm     LV e' medial:    7.58 cm/s LV PW:         1.20 cm     LV E/e' medial:  7.2 LV IVS:        1.10 cm     LV e' lateral:   8.95 cm/s LVOT diam:     2.30 cm     LV E/e' lateral: 6.1 LV SV:         87 LV SV Index:   47 LVOT Area:     4.15 cm  LV Volumes (MOD) LV vol d, MOD A2C: 62.3 ml LV vol d, MOD A4C: 62.5 ml LV vol s, MOD A2C: 37.1 ml LV vol s, MOD A4C: 32.8 ml LV SV MOD A2C:     25.2 ml LV SV MOD A4C:     62.5 ml LV SV MOD BP:      27.5 ml RIGHT VENTRICLE             IVC RV S prime:     17.00 cm/s  IVC diam: 1.60 cm TAPSE (M-mode): 2.3 cm LEFT ATRIUM           Index        RIGHT ATRIUM           Index LA diam:      3.30 cm 1.77 cm/m   RA Area:     10.80 cm LA Vol (A2C): 51.4 ml 27.63 ml/m  RA Volume:   23.60 ml  12.67 ml/m LA Vol (A4C): 28.6 ml 15.36 ml/m  AORTIC VALVE LVOT Vmax:   119.00 cm/s LVOT Vmean:  77.700 cm/s LVOT VTI:    0.209 m  AORTA Ao Root diam: 3.70 cm Ao Asc diam:  4.00 cm MITRAL VALVE MV Area (PHT): 3.66 cm    SHUNTS MV Decel Time: 207 msec    Systemic VTI:  0.21 m MV E velocity: 54.80 cm/s   Systemic Diam:  2.30 cm MV A velocity: 98.10 cm/s MV E/A ratio:  0.56 Armanda Magic MD Electronically signed by Armanda Magic MD Signature Date/Time: 07/17/2022/4:18:04 PM    Final    CT ABDOMEN PELVIS W CONTRAST  Result Date: 07/17/2022 CLINICAL DATA:  Acute abdominal pain. EXAM: CT ABDOMEN AND PELVIS WITH CONTRAST TECHNIQUE: Multidetector CT imaging of the abdomen and pelvis was performed using the standard protocol following bolus administration of intravenous contrast. RADIATION DOSE REDUCTION: This exam was performed according to the departmental dose-optimization program which includes automated exposure control, adjustment of the mA and/or kV according to patient size and/or use of iterative reconstruction technique. CONTRAST:  75mL OMNIPAQUE IOHEXOL 350 MG/ML SOLN COMPARISON:  CTA chest, abdomen and pelvis from 07/11/2022 FINDINGS: Lower chest: New small bilateral pleural effusions with overlying compressive type atelectasis. Ground-glass and airspace densities noted within the anterior right lower lobe. Hepatobiliary: No focal liver abnormality is seen. No gallstones, gallbladder wall thickening, or biliary dilatation. Pancreas: Again seen are changes of severe, acute pancreatitis with necrosis involving the neck, body and proximal tail of pancreas. No main duct dilatation or enhancing mass identified. Progressive, mildly complex peripancreatic fluid is identified extending into bilateral retroperitoneum, small bowel mesentery, and lesser sac. Fluid extension along the posterior wall of the stomach into the left upper quadrant noted. No well-defined, peripherally enhancing fluid collection identified at this time to suggest mature pseudocyst or drainable abscess. Spleen: Normal in size without focal abnormality. Adrenals/Urinary Tract: Normal adrenal glands. No nephrolithiasis, hydronephrosis or suspicious mass. Urinary bladder is unremarkable. Stomach/Bowel: The stomach appears nondistended. Mildly  increased caliber small bowel loops with air-fluid levels identified which measure up to normal caliber of the colon with gas and stool noted up to the level of the rectum. Vascular/Lymphatic: Aortic atherosclerosis. Portal vein, portal venous confluence and SMV remain patent. 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 Multiple scratch set small, non pathologically enlarged abdominal lymph nodes are likely reactive. Reproductive: Prostate is unremarkable. Other: No signs of pneumoperitoneum. Musculoskeletal: No acute or significant osseous findings. IMPRESSION: 1. Again seen are changes of severe, acute pancreatitis with necrosis involving the neck, body and proximal tail of pancreas. Progressive, mildly complex peripancreatic fluid is identified extending into bilateral retroperitoneum, small bowel mesentery, and lesser sac. No well-defined, peripherally enhancing fluid collection identified at this time to suggest mature pseudocyst or drainable abscess. 2. New small bilateral pleural effusions with overlying compressive type atelectasis. Ground-glass and airspace densities noted within the anterior right lower lobe which may reflect atelectasis or pneumonia. 3. Mildly increased caliber small bowel loops with air-fluid levels identified which measure up to normal caliber of the colon with gas and stool noted up to the level of the rectum. Findings are favored to represent ileus. 4. 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 5.  Aortic Atherosclerosis (ICD10-I70.0). Electronically Signed   By: Signa Kell M.D.   On: 07/17/2022 13:58    Lab Results: Recent Labs    07/16/22 0627  WBC 8.3  HGB 9.0*  HCT 26.8*  PLT 191   BMET Recent Labs    07/16/22 0627 07/17/22 0349 07/18/22 0226  NA 136 136 133*  K 3.0* 3.1* 3.2*  CL 102 99 97*  CO2 24 25 26   GLUCOSE 116* 93 118*  BUN 7 5* 5*  CREATININE 0.76 0.66 0.70  CALCIUM 7.4* 8.2* 8.3*   LFT Recent  Labs    07/17/22 0347  PROT 5.6*  ALBUMIN 2.1*  AST 44*  ALT 36  ALKPHOS 122  BILITOT 1.2  BILIDIR 0.4*  IBILI 0.8   PT/INR No results for input(s): "LABPROT", "INR" in the last 72 hours.   Scheduled Meds:  amLODipine  5 mg Oral Daily   amoxicillin-clavulanate  1 tablet Oral Q12H   Chlorhexidine Gluconate Cloth  6 each Topical Daily   enoxaparin (LOVENOX) injection  40 mg Subcutaneous Daily   feeding supplement  1 Container Oral TID BM   folic acid  1 mg Oral Daily   lidocaine  1 patch Transdermal Q24H   multivitamin with minerals  1 tablet Oral Daily   nicotine  7 mg Transdermal Daily   pantoprazole  40 mg Oral QHS   phenobarbital  64.8 mg Oral Q8H   Followed by   Melene Muller ON 07/19/2022] phenobarbital  32.4 mg Oral Q8H   polyethylene glycol  17 g Oral BID   prednisoLONE  40 mg Oral Q24H   thiamine  100 mg Oral Daily   Continuous Infusions:  sodium chloride     lactated ringers Stopped (07/18/22 0455)      Patient profile:   42 year old male with history of EtOH disorder admitted for acute pancreatitis on 6/13    Impression/Plan:   Acute alcoholic pancreatitis with necrosis CT angio C/A/P 6/13 : Large amount of peripancreatic fluid consistent with acute pancreatitis. No PD dilation. Severe hepatic steatosis without mass or duct dilation. Otherwise normal-appearing GI tract.  initial lipase 1737 Lactic acid 1.0, improved from initial 8.5 on admission CT ab/pelvis w/contrast 6/19: severe acute pancreatitis with necrosis and peripancreatic fluid. There are no drainable abscesses or pseudocyst at this time  New small bilateral pleural effusions.  Increased caliber of small bowel loops suggesting ileus.  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 -Continue low fat diet trial with calorie count. If unable to sustain may need to consider Dobhoff  tube for post pyloric tube feeds. Currently doing well on diet thus far -Continue pain control and supportive care  Splenic vein thrombosis - Heme/onc consult to discuss candidacy for anticoagulation ADDENDUM Spoke with Dr. Leonides Schanz. After discussing with radiology it appears that there is no evidence of splenic vein thrombosis.  Splenic vein is occluded secondary to inflammation from pancreatitis.   Alcoholic hepatitis on prednisolone 40 Mg with improvement in LFTs AST 44/ALT 36/alk phos 122 T Bili 1.2 Hepatitis panel was positive for hepatitis B  core IgM, HCV antibody negative.  Hepatitis B surface antibody reflects immune status -Thiamine, multivitamin, folate -Due for [place] score Saturday - Hep B DNA   Ileus per CT Magnesium 1.6 Phosphorus 2.8 Patient reports regularly passing gas and RN documents regular bowel movements. - Increase bowel regimen to scheduled miralax BID, if continued runny stools can decrease to QD - Dulcolax suppository - Continue to maintain magnesium above 2 and potassium at 4-4.5.    Anemia Hgb 9.0, MCV 101.5 Likely hemodilution in the setting of continuous IVF.  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 - Continue daily CBC and transfuse as needed to maintain HGB > 7   Drea Jurewicz M Kensy Blizard  07/18/2022, 1:46 PM

## 2022-07-18 NOTE — Assessment & Plan Note (Signed)
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  GI consulted and recommended heme-onc evaluation. -Consulted heme-onc, appreciate recommendations

## 2022-07-18 NOTE — Assessment & Plan Note (Signed)
Patient's electrolytes remain a concern in the setting of GI loss and saponification with pancreatitis and decreased PO.  K3.1>3.2, magnesium 1.6>1.6, calcium 8.2>8.3, phos 2.8 on recent check. -AM CMP, Mg -Continue supplementation as necessary

## 2022-07-18 NOTE — Assessment & Plan Note (Signed)
Likely secondary to electrolyte abnormalities from recent GI vomiting. Most recent QTC 480s.

## 2022-07-18 NOTE — Progress Notes (Signed)
Occupational Therapy Treatment Patient Details Name: Alexander [name] MRN: [id number] DOB: September 20, 1980 Today's Date: 07/18/2022   History of present illness Pt is a 42 y.o. M who presents 07/11/2022 with acute pancreatitis. 6/15 pt with worsening SOB due to fluid overload; IV Lasix given and pt placed on BiPAP. Significant PMH: alcohol abuse.   OT comments  Hand off of pt from PT at start of session. Session focused on ADL re-training while performing a dual task, standing balance, and cognitive re-training. Pt able to carry on conversation with OT during grooming task successfully without stopping or needing cueing for sequencing. Balance continues to be slightly off although no physical assist was needed during session to maintain. Pt self aware of decreased balance and cognitive deficits. 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 Declined suggestion to sit up in recliner for a short time and requested to return to bed. Continue to recommend intensive inpatient follow up therapy, >3 hours/day. OT will continue to follow patient acutely.     Recommendations for follow up therapy are one component of a multi-disciplinary discharge planning process, led by the attending physician.  Recommendations may be updated based on patient status, additional functional criteria and insurance authorization.    Assistance Recommended at Discharge Intermittent Supervision/Assistance  Patient can return home with the following  A little help with walking and/or transfers;A little help with bathing/dressing/bathroom;Assistance with cooking/housework;Assist for transportation;Direct supervision/assist for financial management;Direct supervision/assist for medications management;Help with stairs or ramp for entrance   Equipment Recommendations  Other (comment) (defer to next venue of care)    Recommendations for Other Services Rehab consult    Precautions / Restrictions  Precautions Precautions: Fall Precaution Comments: Acute fall 6/16 Restrictions Weight Bearing Restrictions: No       Mobility Bed Mobility Overal bed mobility: Needs Assistance Bed Mobility: Sit to Supine       Sit to supine: Supervision, HOB elevated   General bed mobility comments: Pt ambulating with PT in hallway upon therapy arrival. Patient Response: Flat affect, Cooperative  Transfers Overall transfer level: Needs assistance Equipment used: None Transfers: Sit to/from Stand, Bed to chair/wheelchair/BSC Sit to Stand: Supervision     Step pivot transfers: Supervision     General transfer comment: Close supervision provided during all functional mobility and functional transfers while in pt's room. Pt demonstrated slight unsteadiness although did not require physical assist to maintain balance.         ADL either performed or assessed with clinical judgement   ADL Overall ADL's : Needs assistance/impaired     Grooming: Standing;Supervision/safety;Oral care;Wash/dry face Grooming Details (indicate cue type and reason): standing at sink.             Lower Body Dressing: Set up Lower Body Dressing Details (indicate cue type and reason): don/doff hospital socks while seated EOB                Cognition Arousal/Alertness: Awake/alert Behavior During Therapy: Impulsive, Flat affect Overall Cognitive Status: Impaired/Different from baseline Area of Impairment: Attention, Memory, Following commands, Safety/judgement, Awareness, Problem solving        Orientation Level: Situation   Memory: Decreased recall of precautions, Decreased short-term memory Following Commands: Follows one step commands with increased time, Follows multi-step commands with increased time Safety/Judgement: Decreased awareness of safety, Decreased awareness of deficits Awareness: Intellectual Problem Solving: Decreased initiation, Requires verbal cues, Slow processing General  Comments: Pt demonstrated decreased recall and slow processing during session when  asked questions. Pt self aware of slow recall and verbalized when he was trying to text someone last night he noticed he was a lot slower than he normally is.              General Comments VSS on RA    Pertinent Vitals/ Pain       Pain Assessment Pain Assessment: 0-10 Pain Score: 10-Worst pain ever Pain Location: abdomen Pain Descriptors / Indicators: Discomfort, Grimacing Pain Intervention(s): Monitored during session, Repositioned         Frequency  Min 2X/week        Progress Toward Goals  OT Goals(current goals can now be found in the care plan section)  Progress towards OT goals: Progressing toward goals     Plan Discharge plan remains appropriate;Frequency remains appropriate       AM-PAC OT "6 Clicks" Daily Activity     Outcome Measure   Help from another person eating meals?: None Help from another person taking care of personal grooming?: A Little Help from another person toileting, which includes using toliet, bedpan, or urinal?: A Little Help from another person bathing (including washing, rinsing, drying)?: A Little Help from another person to put on and taking off regular upper body clothing?: A Little Help from another person to put on and taking off regular lower body clothing?: A Little 6 Click Score: 19    End of Session Equipment Utilized During Treatment: Gait belt  OT Visit Diagnosis: Unsteadiness on feet (R26.81);Muscle weakness (generalized) (M62.81);Pain Pain - Right/Left:  (lower) Pain - part of body:  (abdomen)   Activity Tolerance Patient tolerated treatment well   Patient Left in bed;with call bell/phone within reach;with bed alarm set           Time: 1610-9604 OT Time Calculation (min): 17 min  Charges: OT General Charges $OT Visit: 1 Visit OT Treatments $Self Care/Home Management : 8-22 mins  Limmie Patricia, OTR/L,CBIS  Supplemental  OT - [facility] and [facility] Secure Chat Preferred    Ozias Dicenzo, Charisse March 07/18/2022, 1:05 PM

## 2022-07-18 NOTE — Progress Notes (Addendum)
FMTS Interim Progress Note  Phone call with Oncology  Spoke with on call Oncologist, Dr. 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 Dr. Leonides Schanz said we were ok to start a heparin drip and that their team would see him. Appreciate oncology's assistance!  Consult to pharmacy started for Heparin gtt  Update 9:22 am - Phone call w/ Dr. Leonides Schanz  Received call from Dr. Leonides Schanz, notes that after discussing with radiologist, splenic vein is not thrombosed, but instead occluded 2/2 to inflammation, which is a common finding in pancreatitis. He note's that this is not 2/2 to thrombosis and thus no Heparin is needed.   Thanked Dr. Leonides Schanz for his efforts and help!  Heparin consult d/c   Bess Kinds, MD 07/18/2022, 8:38 AM PGY-2, [facility] Family Medicine Service pager [phone]

## 2022-07-18 NOTE — Progress Notes (Signed)
Patient continues to refuse to wear a telemetry monitor. MD made aware.

## 2022-07-18 NOTE — Assessment & Plan Note (Signed)
Due to volume overload requiring BiPAP and transfer to ICU. Now on RA and ambulating well.  Given fluid overload, echo was obtained which demonstrated ejection fraction of 55 to 60% with LVH and G1 DD. -Monitor O2 status -Encourage PO intake -s/p IV Lasix 40 mg once

## 2022-07-18 NOTE — Assessment & Plan Note (Signed)
Evidence of severe hepatic steatosis incidentally found on CT abdomen/pelvis.  Right upper quadrant sound with confirmatory findings.  Patient at risk for liver pathology given elevated liver enzymes in the setting of chronic heavy alcoholism and also found to have small volume of ascites on imaging as well.  Hepatitis panel was positive for hepatitis B core IgM, HCV antibody negative.  Hepatitis B surface antibody reflects immune status. LFTs coming down after fluids for pancreatitis. - Consider tapping ascites as above - Continue prednisolone for alcoholic hepatitis, repeat Lille score in 7 days to assess efficacy

## 2022-07-18 NOTE — Progress Notes (Signed)
Physical Therapy Treatment Patient Details Name: Alexander [name] MRN: [id number] DOB: 31-Jan-1980 Today's Date: 07/18/2022   History of Present Illness Pt is a 42 y.o. M who presents 07/11/2022 with acute pancreatitis. 6/15 pt with worsening SOB due to fluid overload; IV Lasix given and pt placed on BiPAP. Significant PMH: alcohol abuse.    PT Comments    The pt was ambulating in room upon arrival of PT, continues to move with slowed speeds and increased time to manage dual-task and following of commands. The pt did however, demo improved stability and endurance with hallway ambulation, and required minG for unchallenged ambulation and minA with addition of balance challenges such as tandem walking, changes in direction, and head turns. Despite progress, the pt remains at elevated risk for falls, will continue to benefit from skilled PT acutely to improve safety with independence while mobilizing, may continue to make good progress very quickly with continued interventions.    Dynamic Gait Index (DGI): 13/24 (<19 indicates increased risk for falls)   Recommendations for follow up therapy are one component of a multi-disciplinary discharge planning process, led by the attending physician.  Recommendations may be updated based on patient status, additional functional criteria and insurance authorization.  Follow Up Recommendations       Assistance Recommended at Discharge Frequent or constant Supervision/Assistance  Patient can return home with the following A little help with walking and/or transfers;A little help with bathing/dressing/bathroom;Assistance with cooking/housework;Direct supervision/assist for medications management;Direct supervision/assist for financial management;Assist for transportation;Help with stairs or ramp for entrance   Equipment Recommendations  Rolling walker (2 wheels);BSC/3in1    Recommendations for Other Services       Precautions / Restrictions  Precautions Precautions: Fall Precaution Comments: Acute fall 6/16 Restrictions Weight Bearing Restrictions: No     Mobility  Bed Mobility               General bed mobility comments: pt ambulating in room upon arrival    Transfers Overall transfer level: Needs assistance Equipment used: 1 person hand held assist Transfers: Sit to/from Stand Sit to Stand: Min guard           General transfer comment: minA without use of UE, minG with use of UE on armrest or railing    Ambulation/Gait Ambulation/Gait assistance: Min assist Gait Distance (Feet): 200 Feet Assistive device: 1 person hand held assist, None Gait Pattern/deviations: Step-through pattern, Shuffle, Decreased stride length, Narrow base of support Gait velocity: decreased Gait velocity interpretation: <1.31 ft/sec, indicative of household ambulator   General Gait Details: pt with narrow BOS and intermittent drifting. no overt LOB but pt with slowed movements and limited balance reactions. HHA and minA needed with balance challenge   Stairs Stairs: Yes Stairs assistance: Min guard Stair Management: One rail Right, Step to pattern, Forwards Number of Stairs: 6        Balance Overall balance assessment: Needs assistance Sitting-balance support: Feet supported Sitting balance-Leahy Scale: Fair Sitting balance - Comments: sitting EOB   Standing balance support: Reliant on assistive device for balance, Single extremity supported Standing balance-Leahy Scale: Fair Standing balance comment: can ambulate without UE support, poor tolerance for challenge             High level balance activites: Backward walking, Direction changes, Head turns, Other (comment) (tandem walking) High Level Balance Comments: increased assist with challenge Standardized Balance Assessment Standardized Balance Assessment : Dynamic Gait Index   Dynamic Gait Index Level Surface: Mild Impairment Change in Gait Speed:  Moderate Impairment Gait with Horizontal Head Turns: Mild Impairment Gait with Vertical Head Turns: Mild Impairment Gait and Pivot Turn: Mild Impairment Step Over Obstacle: Moderate Impairment Step Around Obstacles: Mild Impairment Steps: Moderate Impairment Total Score: 13      Cognition Arousal/Alertness: Awake/alert Behavior During Therapy: Impulsive, Flat affect Overall Cognitive Status: Impaired/Different from baseline Area of Impairment: Attention, Memory, Following commands, Safety/judgement, Awareness, Problem solving                   Current Attention Level: Selective Memory: Decreased recall of precautions, Decreased short-term memory Following Commands: Follows one step commands with increased time, Follows multi-step commands with increased time Safety/Judgement: Decreased awareness of safety, Decreased awareness of deficits Awareness: Intellectual Problem Solving: Decreased initiation, Requires verbal cues, Slow processing General Comments: pt able to follow simple commands well, requires increased time after all cues, reports he is feeling generally delayed with all movements.        Exercises      General Comments General comments (skin integrity, edema, etc.): VSS on RA      Pertinent Vitals/Pain Pain Assessment Pain Assessment: Faces Pain Score: 10-Worst pain ever Faces Pain Scale: Hurts little more Pain Location: abdomen Pain Descriptors / Indicators: Discomfort Pain Intervention(s): Limited activity within patient's tolerance, Monitored during session, Repositioned     PT Goals (current goals can now be found in the care plan section) Acute Rehab PT Goals Patient Stated Goal: to walk more PT Goal Formulation: With patient Time For Goal Achievement: 07/28/22 Potential to Achieve Goals: Good Progress towards PT goals: Progressing toward goals    Frequency    Min 3X/week      PT Plan Current plan remains appropriate       AM-PAC  PT "6 Clicks" Mobility   Outcome Measure  Help needed turning from your back to your side while in a flat bed without using bedrails?: A Little Help needed moving from lying on your back to sitting on the side of a flat bed without using bedrails?: A Little Help needed moving to and from a bed to a chair (including a wheelchair)?: A Little Help needed standing up from a chair using your arms (e.g., wheelchair or bedside chair)?: A Little Help needed to walk in hospital room?: A Little Help needed climbing 3-5 steps with a railing? : A Lot 6 Click Score: 17    End of Session Equipment Utilized During Treatment: Gait belt Activity Tolerance: Patient tolerated treatment well Patient left: with call bell/phone within reach;in bed;with nursing/sitter in room Nurse Communication: Mobility status PT Visit Diagnosis: Unsteadiness on feet (R26.81);Muscle weakness (generalized) (M62.81);Difficulty in walking, not elsewhere classified (R26.2)     Time: 4098-1191 PT Time Calculation (min) (ACUTE ONLY): 24 min  Charges:  $Gait Training: 8-22 mins $Therapeutic Activity: 8-22 mins                     Vickki Muff, PT, DPT   Acute Rehabilitation Department Office [phone] Secure Chat Communication Preferred   Ronnie Derby 07/18/2022, 11:50 AM

## 2022-07-18 NOTE — Progress Notes (Signed)
Inpatient Rehabilitation Admissions Coordinator   Met with patient at bedside. Noted mobilizing well, but remains with cognition issues. We will follow.  Ottie Glazier, RN, MSN Rehab Admissions Coordinator [phone] 07/18/2022 1:53 PM

## 2022-07-18 NOTE — Assessment & Plan Note (Addendum)
Multiple blood pressures have been high.  Amlodipine was started by CCM.  Will take a couple days to take effect.  Believe most likely due to pain though could have some element of chronic hypertension. - s/p IV Lasix 40 mg for blood pressure and fluid overload -Consider IV labetalol while decreased p.o.

## 2022-07-18 NOTE — Plan of Care (Signed)

## 2022-07-18 NOTE — Progress Notes (Signed)
Daily Progress Note Intern Pager: [phone]  Patient name: Alexander [name] Medical record number: [id number] Date of birth: 1980/04/27 Age: 42 y.o. Gender: male  Primary Care Provider: Patient, No Pcp Per Consultants: CCM in ER, GI (S/O) Code Status: Full   Pt Overview and Major Events to Date:  6/13: Admitted 6/15: admitted to ICU, placed on precedex/BiPAP 6/19: back to FMTS on room air, phenobarb taper   Assessment and Plan: Alexander [name] is a 42 y.o. male who presented with abdominal pain, nausea and vomiting in the setting of acute pancreatitis and alcohol use continuing to be managed for pancreatitis and alcohol withdrawal. 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  Hospital Problem List      Hospital     * (Principal) Acute pancreatitis     And vomiting improved, though patient continues to have severe back and  stomach pain. Initially CT abdomen/pelvis on admission showed signs  consistent with acute pancreatitis.  Lipid panel without  hypertriglyceridemia.  Right upper quadrant ultrasound with fatty liver  but otherwise normal.  Repeat CTAP with severe acute pancreatitis now with  necrosis and progressive mildly complex peripancreatic fluid extending  into bilateral retroperitoneum.  Lactic acid normal.  GI consulted who  will continue treatment with IV fluids and antibiotics with bowel regimen  with pain meds. -GI following appreciate recs -Will likely have core track placed tomorrow given decreased p.o. intake -Gentle IV fluids, will assess further need this PM -Continue Augmentin but will reach out to GI clarifying regimen (he was  originally on this for aspiration pneumonia) - Cardiac telemetry  - Vitals per routine - KVO LR - Percocet, Dilaudid as needed for severe pain - Miralax daily PRN for bowel regimen - Heart diet        Alcohol withdrawal (HCC)     Patient drinks 8oz of gin anywhere from 5-10 times daily, last  drink  was 6/12 PM. Needed precedex and transferred to ICU. Now on phenobarb  taper and tolerating well, CIWA 2>0. - CIWA protocol - Continue phenobarb - Monitor BMPs - Cardiac telemetry - Thiamine 100 mg, Folic acid 1 mg,  MVI daily        RESOLVED: Acute lactic acidosis     Lactic acid 8.5 on admission, patient received 3 L of NS.  Repeat  lactic acid improving at 3.2.  Reassured by improvement, less likely GI  necrosis. - IVF with LR at 250 mL/h        Anemia     Hovering around 8-9 last couple checks. Likely due to hemodilution from  fluid overload. - Continue to monitor - Consider iron studies when not acutely ill        Prolonged QT interval     Likely secondary to electrolyte abnormalities from recent GI vomiting.  Most recent QTC 480s.        Hepatic steatosis     Evidence of severe hepatic steatosis incidentally found on CT  abdomen/pelvis.  Right upper quadrant sound with confirmatory findings.   Patient at risk for liver pathology given elevated liver enzymes in the  setting of chronic heavy alcoholism and also found to have small volume of  ascites on imaging as well.  Hepatitis panel was positive for hepatitis B  core IgM, HCV antibody negative.  Hepatitis B surface antibody reflects  immune status. LFTs coming down after fluids for pancreatitis. - Consider tapping ascites as above - Continue prednisolone for alcoholic hepatitis,  repeat Lille score in 7  days to assess efficacy        Ascites     Noted on CT abdomen/pelvis. Not previously diagnosed. Low likelihood of  SBP given that patient doesn't have white count or signs of bacterial  infection, though we will need to get a sample when patient can tolerate. -Plan to repeat right upper quadrant ultrasound when clinically improved  with tapping of fluid if possible        Electrolyte abnormality     Patient's electrolytes remain a concern in the setting of GI loss and  saponification with pancreatitis  and decreased PO.  K3.1>3.2, magnesium  1.6>1.6, calcium 8.2>8.3, phos 2.8 on recent check. -AM CMP, Mg -Continue supplementation as necessary        Hypokalemia     Hypomagnesemia     Acute respiratory failure with hypoxia (HCC)     Due to volume overload requiring BiPAP and transfer to ICU. Now on RA  and ambulating well.  Given fluid overload, echo was obtained which  demonstrated ejection fraction of 55 to 60% with LVH and G1 DD. -Monitor O2 status -Encourage PO intake -s/p IV Lasix 40 mg once        Hypocalcemia     Hypertension     Multiple blood pressures have been high.  Amlodipine was started by  CCM.  Will take a couple days to take effect.  Believe most likely due to  pain though could have some element of chronic hypertension. - s/p IV Lasix 40 mg for blood pressure and fluid overload -Consider IV labetalol while decreased p.o.        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  GI consulted and recommended heme-onc evaluation. -Consulted heme-onc, appreciate recommendations       FEN/GI: Heart diet for now, though consider TPN if not able to take n.p.o. PPx: Lovenox Dispo: Pending clinical improvement  Subjective:  Continues to have significant pain today in his stomach and back.  He has not been getting much Dilaudid that is on for him.  He has been able to eat a little bit but not back to baseline.  He is able to breathe fine on room air.  Objective: Temp:  [98.1 F (36.7 C)-99.4 F (37.4 C)] 98.1 F (36.7 C) (06/20 1126) Resp:  [20] 20 (06/20 0724) BP: (138-164)/(94-107) 138/95 (06/20 1126) SpO2:  [98 %-99 %] 98 % (06/20 1126) Physical Exam: General: Lying in bed, no acute distress Cardiovascular: Tachycardic, no murmurs rubs or gallops Respiratory: Coarse breath sounds bilateral lower lobes Abdomen: Soft, mildly distended, severely  tender to mild palpation, normoactive bowel sounds Extremities: Moves all extremities grossly equally though limited by overall pain  Laboratory: Most recent CBC Lab Results  Component Value Date   WBC 8.3 07/16/2022   HGB 9.0 (L) 07/16/2022   HCT 26.8 (L) 07/16/2022   MCV 101.5 (H) 07/16/2022   PLT 191 07/16/2022   Most recent BMP    Latest Ref Rng & Units 07/18/2022    2:26 AM  BMP  Glucose 70 - 99 mg/dL 119   BUN 6 - 20 mg/dL 5   Creatinine 1.47 - 8.29 mg/dL 5.62   Sodium 130 - 865 mmol/L 133   Potassium 3.5 - 5.1 mmol/L 3.2   Chloride 98 - 111 mmol/L 97   CO2 22 -  32 mmol/L 26   Calcium 8.9 - 10.3 mg/dL 8.3    Evette Georges, MD 07/18/2022, 1:32 PM PGY-1, [facility] Family Medicine FPTS Intern pager: [phone], text pages welcome Secure chat group [facility] Teaching Service

## 2022-07-18 NOTE — Assessment & Plan Note (Addendum)
And vomiting improved, though patient continues to have severe back and stomach pain. Initially CT abdomen/pelvis on admission showed signs consistent with acute pancreatitis.  Lipid panel without hypertriglyceridemia.  Right upper quadrant ultrasound with fatty liver but otherwise normal.  Repeat CTAP with severe acute pancreatitis now with necrosis and progressive mildly complex peripancreatic fluid extending into bilateral retroperitoneum.  Lactic acid normal.  GI consulted who will continue treatment with IV fluids and antibiotics with bowel regimen with pain meds. -GI following appreciate recs -Will likely have core track placed tomorrow given decreased p.o. intake -Gentle IV fluids, will assess further need this PM -Continue Augmentin but will reach out to GI clarifying regimen (he was originally on this for aspiration pneumonia) - Cardiac telemetry  - Vitals per routine - KVO LR - Percocet, Dilaudid as needed for severe pain - Miralax daily PRN for bowel regimen - Heart diet

## 2022-07-18 NOTE — Assessment & Plan Note (Addendum)
Hovering around 8-9 last couple checks. Likely due to hemodilution from fluid overload. - Continue to monitor - Consider iron studies when not acutely ill

## 2022-07-19 LAB — CBC WITH DIFFERENTIAL/PLATELET
Abs Immature Granulocytes: 0.14 10*3/uL — ABNORMAL HIGH (ref 0.00–0.07)
Basophils Absolute: 0 10*3/uL (ref 0.0–0.1)
Basophils Relative: 0 %
Eosinophils Absolute: 0 10*3/uL (ref 0.0–0.5)
Eosinophils Relative: 0 %
HCT: 22.4 % — ABNORMAL LOW (ref 39.0–52.0)
Hemoglobin: 7.7 g/dL — ABNORMAL LOW (ref 13.0–17.0)
Immature Granulocytes: 1 %
Lymphocytes Relative: 6 %
Lymphs Abs: 0.9 10*3/uL (ref 0.7–4.0)
MCH: 33.9 pg (ref 26.0–34.0)
MCHC: 34.4 g/dL (ref 30.0–36.0)
MCV: 98.7 fL (ref 80.0–100.0)
Monocytes Absolute: 0.8 10*3/uL (ref 0.1–1.0)
Monocytes Relative: 6 %
Neutro Abs: 12.3 10*3/uL — ABNORMAL HIGH (ref 1.7–7.7)
Neutrophils Relative %: 87 %
Platelets: 248 10*3/uL (ref 150–400)
RBC: 2.27 MIL/uL — ABNORMAL LOW (ref 4.22–5.81)
RDW: 13.3 % (ref 11.5–15.5)
WBC: 14.2 10*3/uL — ABNORMAL HIGH (ref 4.0–10.5)
nRBC: 0.4 % — ABNORMAL HIGH (ref 0.0–0.2)

## 2022-07-19 LAB — COMPREHENSIVE METABOLIC PANEL
ALT: 26 U/L (ref 0–44)
AST: 31 U/L (ref 15–41)
Albumin: 1.9 g/dL — ABNORMAL LOW (ref 3.5–5.0)
Alkaline Phosphatase: 106 U/L (ref 38–126)
Anion gap: 14 (ref 5–15)
BUN: <5 mg/dL — ABNORMAL LOW (ref 6–20)
CO2: 25 mmol/L (ref 22–32)
Calcium: 8.5 mg/dL — ABNORMAL LOW (ref 8.9–10.3)
Chloride: 94 mmol/L — ABNORMAL LOW (ref 98–111)
Creatinine, Ser: 0.66 mg/dL (ref 0.61–1.24)
GFR, Estimated: >60 mL/min (ref >60)
Glucose, Bld: 118 mg/dL — ABNORMAL HIGH (ref 70–99)
Potassium: 3 mmol/L — ABNORMAL LOW (ref 3.5–5.1)
Sodium: 133 mmol/L — ABNORMAL LOW (ref 135–145)
Total Bilirubin: 1.2 mg/dL (ref 0.3–1.2)
Total Protein: 5.2 g/dL — ABNORMAL LOW (ref 6.5–8.1)

## 2022-07-19 LAB — MAGNESIUM: Magnesium: 1.4 mg/dL — ABNORMAL LOW (ref 1.7–2.4)

## 2022-07-19 LAB — HEPATITIS B DNA, ULTRAQUANTITATIVE, PCR
HBV DNA SERPL PCR-ACNC: NOT DETECTED IU/mL
HBV DNA SERPL PCR-LOG IU: UNDETERMINED log10 IU/mL

## 2022-07-19 MED ORDER — POTASSIUM CHLORIDE CRYS ER 20 MEQ PO TBCR
40.0000 meq | EXTENDED_RELEASE_TABLET | Freq: Two times a day (BID) | ORAL | Status: AC
  Administered 2022-07-19 (×2): 40 meq via ORAL
  Filled 2022-07-19: qty 2

## 2022-07-19 MED ORDER — KATE FARMS STANDARD 1.4 PO LIQD
325.0000 mL | Freq: Every day | ORAL | Status: DC
  Administered 2022-07-20 – 2022-07-22 (×3): 325 mL via ORAL
  Filled 2022-07-19 (×5): qty 325

## 2022-07-19 MED ORDER — POTASSIUM CHLORIDE 10 MEQ/100ML IV SOLN
10.0000 meq | INTRAVENOUS | Status: DC
  Administered 2022-07-19: 10 meq via INTRAVENOUS
  Filled 2022-07-19: qty 100

## 2022-07-19 MED ORDER — PROSOURCE PLUS PO LIQD
30.0000 mL | Freq: Three times a day (TID) | ORAL | Status: DC
  Administered 2022-07-19 – 2022-07-23 (×8): 30 mL via ORAL
  Filled 2022-07-19 (×9): qty 30

## 2022-07-19 MED ORDER — LOSARTAN POTASSIUM 50 MG PO TABS
25.0000 mg | ORAL_TABLET | Freq: Every day | ORAL | Status: DC
  Administered 2022-07-19 – 2022-07-23 (×5): 25 mg via ORAL
  Filled 2022-07-19 (×5): qty 1

## 2022-07-19 MED ORDER — MAGNESIUM SULFATE 4 GM/100ML IV SOLN
4.0000 g | Freq: Once | INTRAVENOUS | Status: AC
  Administered 2022-07-19: 4 g via INTRAVENOUS
  Filled 2022-07-19: qty 100

## 2022-07-19 MED ORDER — PIPERACILLIN-TAZOBACTAM 3.375 G IVPB
3.3750 g | Freq: Three times a day (TID) | INTRAVENOUS | Status: DC
  Administered 2022-07-19 – 2022-07-21 (×6): 3.375 g via INTRAVENOUS
  Filled 2022-07-19 (×5): qty 50

## 2022-07-19 MED ORDER — PIPERACILLIN-TAZOBACTAM 3.375 G IVPB: 3.3750 g | Freq: Four times a day (QID) | INTRAVENOUS | Status: DC

## 2022-07-19 MED ORDER — MAGNESIUM SULFATE 2 GM/50ML IV SOLN: 2.0000 g | Freq: Once | INTRAVENOUS | Status: DC

## 2022-07-19 NOTE — Progress Notes (Signed)
Daily Progress Note Intern Pager: [phone]  Patient name: Alexander [name] Medical record number: [id number] Date of birth: 11-07-1980 Age: 42 y.o. Gender: male  Primary Care Provider: Patient, No Pcp Per Consultants: CCM in ER, GI (S/O) Code Status: Full   Pt Overview and Major Events to Date:  6/13: Admitted 6/15: admitted to ICU, placed on precedex/BiPAP 6/19: back to FMTS on room air, phenobarb taper   Assessment and Plan: Alexander [name] is a 42 y.o. male who presented with abdominal pain, nausea and vomiting in the setting of acute pancreatitis and alcohol use continuing to be managed for pancreatitis and alcohol withdrawal. 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  Hospital Problem List      Hospital     * (Principal) Acute pancreatitis     Nausea and vomiting improved, though patient continues to have severe  back and stomach pain. Initially CT abdomen/pelvis on admission showed  signs consistent with acute pancreatitis.  Lipid panel without  hypertriglyceridemia.  Right upper quadrant ultrasound with fatty liver  but otherwise normal.  Repeat CTAP with severe acute pancreatitis now with  necrosis and progressive mildly complex peripancreatic fluid extending  into bilateral retroperitoneum.  Lactic acid normal.  GI consulted who  will continue treatment with IV fluids and antibiotics (Augmentin) with  bowel regimen with pain meds.  He would likely be getting core track today  given decreased p.o. He did have a bump in his leukocytosis this morning,  and while could be infection, consider affect of inflammation from  pancreatitis and steroids. -GI following appreciate recs - Stop fluids, continue diet as tolerated, reconsider NG as needed at  patient request - Cardiac telemetry  - Vitals per routine - KVO LR - Percocet, Dilaudid as needed for severe pain - Miralax daily PRN for bowel regimen - Heart diet        Alcohol  withdrawal (HCC)     Patient drinks 8oz of gin anywhere from 5-10 times daily, last drink  was 6/12 PM. Needed precedex and transferred to ICU. Now on phenobarb  taper and tolerating well, CIWA 2>0. - CIWA protocol - Continue phenobarb until taper ends on 6/23 - Monitor BMPs - Cardiac telemetry - Thiamine 100 mg, Folic acid 1 mg,  MVI daily        RESOLVED: Acute lactic acidosis     Lactic acid 8.5 on admission, patient received 3 L of NS.  Repeat  lactic acid improving at 3.2.  Reassured by improvement, less likely GI  necrosis. - IVF with LR at 250 mL/h        Anemia     Hovering around 8-9 last couple checks. Likely due to hemodilution from  fluid overload.  7.7 this a.m. after more fluid administration. - Continue to monitor - Consider iron studies when not acutely ill        Prolonged QT interval     Likely secondary to electrolyte abnormalities from recent GI vomiting.  Most recent QTC 480s.        Hepatic steatosis     Evidence of severe hepatic steatosis incidentally found on CT  abdomen/pelvis.  Right upper quadrant sound with confirmatory findings.   Patient at risk for liver pathology given elevated liver enzymes in the  setting of chronic heavy alcoholism and also found to have small volume of  ascites on imaging as well.  Hepatitis panel was positive for hepatitis B  core IgM, HCV antibody  negative.  Hepatitis B surface antibody reflects  immune status. LFTs coming down after fluids for pancreatitis. - Continue prednisolone for alcoholic hepatitis, repeat Lille score  tomorrow, obtain PT/INR and CMP in a.m. to calculate        Ascites     Noted on CT abdomen/pelvis. Not previously diagnosed. Low likelihood of  SBP given that patient doesn't have white count or signs of bacterial  infection.  Small volume on admission, will not pursue paracentesis at  this time.        Electrolyte abnormality     Patient's electrolytes remain a concern in the setting of  GI loss and  saponification with pancreatitis and decreased PO.  K3.1>3.2>3.0,  magnesium 1.6>1.6>1.4, calcium 8.2>8.3>8.5 on recent check. -AM CMP, Mg -Continue supplementation as necessary        RESOLVED: Hypokalemia     RESOLVED: Hypomagnesemia     Acute respiratory failure with hypoxia (HCC)     Due to volume overload requiring BiPAP and transfer to ICU. Now on RA  and ambulating well.  Given fluid overload, echo was obtained which  demonstrated ejection fraction of 55 to 60% with LVH and G1 DD. -Monitor O2 status -Encourage PO intake -s/p IV Lasix 40 mg once        RESOLVED: Hypocalcemia     Hypertension     Multiple blood pressures have been high.  Amlodipine was started by  CCM.  Will take a couple days to take effect.  Believe most likely due to  pain though could have some element of chronic hypertension. 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  GI consulted and recommended heme-onc evaluation  who stated not true occlusion but due to inflammation of the vessel.        Protein-calorie malnutrition, severe   FEN/GI: Likely cortrak today PPx: Lovenox Dispo: Pending clinical improvement  Subjective:  Fairly unchanged this morning.  Has been able to take in more food today, however.  Feels as though it has been difficult to get his pain medicines.  Objective: Temp:  [98.5 F (36.9 C)-99.2 F (37.3 C)] 98.8 F (37.1 C) (06/21 0721) Pulse Rate:  [93-108] 93 (06/21 0721) Resp:  [16-20] 16 (06/21 0721) BP: (137-177)/(86-113) 140/86 (06/21 0721) SpO2:  [97 %-99 %] 97 % (06/21 0721) Physical Exam: General: Moving around in bed with mild pain, no acute distress Cardiovascular: Regular rate and  rhythm without murmurs rubs or gallops Respiratory: Diminished breath sounds in bilateral lung bases, breathing and speaking comfortably on room air Abdomen: Soft, diffusely tender to palpation, normoactive bowel sounds Extremities: Moves all extremities grossly equally  Laboratory: Most recent CBC Lab Results  Component Value Date   WBC 14.2 (H) 07/19/2022   HGB 7.7 (L) 07/19/2022   HCT 22.4 (L) 07/19/2022   MCV 98.7 07/19/2022   PLT 248 07/19/2022   Most recent BMP    Latest Ref Rng & Units 07/19/2022    6:40 AM  BMP  Glucose 70 - 99 mg/dL 161   BUN 6 - 20 mg/dL <5   Creatinine 0.96 - 1.24 mg/dL 0.45   Sodium 409 - 811 mmol/L 133   Potassium  3.5 - 5.1 mmol/L 3.0   Chloride 98 - 111 mmol/L 94   CO2 22 - 32 mmol/L 25   Calcium 8.9 - 10.3 mg/dL 8.5    Evette Georges, MD 07/19/2022, 11:29 AM PGY-1, [facility] FPTS Intern pager: [phone], text pages welcome Secure chat group [facility] Teaching Service

## 2022-07-19 NOTE — Progress Notes (Signed)
Inpatient Rehab Admissions Coordinator:   CIR following, but suspect Pt. May be too high level for CIR at this point based on OT note yesterday. PT to work with Pt. Today to assess balance. He does have significant cognitive impairments that would prevent him from discharging home alone. Will re-assess candidacy once PT gets their note in today.  Megan Salon, MS, CCC-SLP Rehab Admissions Coordinator  [phone] (celll) [phone] (office)

## 2022-07-19 NOTE — Assessment & Plan Note (Signed)
Patient drinks 8oz of gin anywhere from 5-10 times daily, last drink was 6/12 PM. Needed precedex and transferred to ICU. Now on phenobarb taper and tolerating well, CIWA 2>0. - CIWA protocol - Continue phenobarb until taper ends on 6/23 - Monitor BMPs - Cardiac telemetry - Thiamine 100 mg, Folic acid 1 mg,  MVI daily 

## 2022-07-19 NOTE — Assessment & Plan Note (Addendum)
Evidence of severe hepatic steatosis incidentally found on CT abdomen/pelvis.  Right upper quadrant sound with confirmatory findings.  Patient at risk for liver pathology given elevated liver enzymes in the setting of chronic heavy alcoholism and also found to have small volume of ascites on imaging as well.  Hepatitis panel was positive for hepatitis B core IgM, HCV antibody negative.  Hepatitis B surface antibody reflects immune status. LFTs coming down after fluids for pancreatitis. - Continue prednisolone for alcoholic hepatitis, repeat Lille score tomorrow, obtain PT/INR and CMP in a.m. to calculate

## 2022-07-19 NOTE — Assessment & Plan Note (Signed)
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  GI consulted and recommended heme-onc evaluation who stated not true occlusion but due to inflammation of the vessel. 

## 2022-07-19 NOTE — Assessment & Plan Note (Signed)
Likely secondary to electrolyte abnormalities from recent GI vomiting. Most recent QTC 480s. 

## 2022-07-19 NOTE — Progress Notes (Signed)
Physical Therapy Treatment Patient Details Name: Alexander [name] MRN: [id number] DOB: [birth year] Today's Date: 07/19/2022   History of Present Illness Pt is a 42 y.o. M who presents 07/11/2022 with acute pancreatitis. 6/15 pt with worsening SOB due to fluid overload; IV Lasix given and pt placed on BiPAP. Significant PMH: alcohol abuse.    PT Comments    Progressing patient towards goals per plan of care. Consistent minA with higher level gait tasks (tandem, weaving, increased step distance and dual tasking); presented with intermittent periods of minor LOB needing minA to recover. Pt challenged with dual task and ambulation; presents with periods of slowed sequencing, slower processing, and poor dual task performance. Pt able to ambulate with min guard assist with basic ambulation but presents with consistent drift to the left; patient unaware of drift. Progressed patient to stair navigation without rail support; able to navigate in forwards direction and minA in order recover minor LOB with backwards from two steps. Recommend next session to include multi-instruction tasks and progress functional mobility. Recommend speech therapy consult for a more in depth cognitive evaluation. Continue to feel pt would benefit from multidisciplinary rehab to maximize functional independence, safety, and improved cognition prior to return home with family support.     Recommendations for follow up therapy are one component of a multi-disciplinary discharge planning process, led by the attending physician.  Recommendations may be updated based on patient status, additional functional criteria and insurance authorization.  Follow Up Recommendations       Assistance Recommended at Discharge Frequent or constant Supervision/Assistance  Patient can return home with the following A little help with walking and/or transfers;A little help with bathing/dressing/bathroom;Assistance with cooking/housework;Direct  supervision/assist for medications management;Direct supervision/assist for financial management;Assist for transportation;Help with stairs or ramp for entrance   Equipment Recommendations  Rolling walker (2 wheels);BSC/3in1    Recommendations for Other Services Speech consult     Precautions / Restrictions Precautions Precautions: Fall Precaution Comments: Acute fall 6/16 Restrictions Weight Bearing Restrictions: No     Mobility  Bed Mobility Overal bed mobility: Needs Assistance Bed Mobility: Sit to Supine     Supine to sit: HOB elevated, Supervision Sit to supine: Supervision, HOB elevated   General bed mobility comments: Increased time but able to perform perform without physical assistance.    Transfers Overall transfer level: Needs assistance Equipment used: None Transfers: Sit to/from Stand Sit to Stand: Supervision           General transfer comment: Performed sit  <> stand without physical assistance but PT close to bedside; presented with minor posterior lean with initial upright stance;    Ambulation/Gait Ambulation/Gait assistance: Min assist Gait Distance (Feet): 250 Feet Assistive device: 1 person hand held assist, None Gait Pattern/deviations: Step-through pattern, Decreased stride length, Narrow base of support, Drifts right/left, Staggering left Gait velocity: decreased Gait velocity interpretation: <1.31 ft/sec, indicative of household ambulator   General Gait Details: Presented with narrow BOS and continued left drift throughout session; Consistent min A in order to correct minor LOB with tandem, weaving, and larger stepping patterns. Dual tasking presents with intermittent periods of brain fog and slower processing with walking. Pt challenged with forward gaze and higher level gait; poor return demonstration as pt would look down and require minA for balance.   Stairs Stairs: Yes Stairs assistance: Min assist Stair Management: Forwards, No  rails, Step to pattern, Backwards Number of Stairs: 2 General stair comments: x3 trials; one instance of minor LOB with backwards requiring  MinA to regain balance. Progressed to no rail support; able to ascend and descend without physical assistance in forward direction   Wheelchair Mobility    Modified Rankin (Stroke Patients Only)       Balance Overall balance assessment: Needs assistance Sitting-balance support: Feet supported Sitting balance-Leahy Scale: Fair Sitting balance - Comments: sitting EOB   Standing balance support: No upper extremity supported Standing balance-Leahy Scale: Fair Standing balance comment: Can statically stand without UE support in parallel; required minA with tandem, romberg and eye closed.     Tandem Stance - Right Leg:  (Challenged bilaterally requiring HHA in order to perform task and maintain balance)   Rhomberg - Eyes Opened:  (Held for 10 sec, x2 trials able to stand without physical assistance) Rhomberg - Eyes Closed:  (Unable to tolerate EC, without minA to correct stance.)   High Level Balance Comments: Patient consistently required minA in order to tolerate challenge with high level balance statically and dynamically. Unable to tolerate tandem stance without physical assistance.            Cognition Arousal/Alertness: Awake/alert Behavior During Therapy: WFL for tasks assessed/performed Overall Cognitive Status: Impaired/Different from baseline Area of Impairment: Attention, Memory, Following commands, Safety/judgement, Awareness, Problem solving                 Orientation Level: Situation Current Attention Level: Focused Memory: Decreased recall of precautions, Decreased short-term memory Following Commands: Follows one step commands with increased time, Follows multi-step commands with increased time Safety/Judgement: Decreased awareness of safety, Decreased awareness of deficits Awareness: Intellectual Problem Solving:  Decreased initiation, Requires verbal cues, Slow processing, Difficulty sequencing General Comments: Pt aware of cognitive impairments; intermittent cues regarding situation for recall and dual tasks throughout session. Demontrated increased time for dual tasking with ambulation.        Exercises      General Comments        Pertinent Vitals/Pain Pain Assessment Pain Assessment: 0-10 Pain Score: 8  Pain Location: abdomen Pain Descriptors / Indicators: Discomfort Pain Intervention(s): Monitored during session    Home Living                          Prior Function            PT Goals (current goals can now be found in the care plan section) Acute Rehab PT Goals Patient Stated Goal: to walk more PT Goal Formulation: With patient Time For Goal Achievement: 07/28/22 Potential to Achieve Goals: Good Progress towards PT goals: Progressing toward goals    Frequency    Min 3X/week      PT Plan Current plan remains appropriate    Co-evaluation              AM-PAC PT "6 Clicks" Mobility   Outcome Measure  Help needed turning from your back to your side while in a flat bed without using bedrails?: A Little Help needed moving from lying on your back to sitting on the side of a flat bed without using bedrails?: A Little Help needed moving to and from a bed to a chair (including a wheelchair)?: A Little Help needed standing up from a chair using your arms (e.g., wheelchair or bedside chair)?: A Little Help needed to walk in hospital room?: A Little Help needed climbing 3-5 steps with a railing? : A Lot 6 Click Score: 17    End of Session Equipment Utilized During Treatment: Gait belt Activity  Tolerance: Patient tolerated treatment well Patient left: with call bell/phone within reach;in bed;with nursing/sitter in room Nurse Communication: Mobility status PT Visit Diagnosis: Unsteadiness on feet (R26.81);Muscle weakness (generalized) (M62.81);Difficulty  in walking, not elsewhere classified (R26.2)     Time: 0630-1601 PT Time Calculation (min) (ACUTE ONLY): 33 min  Charges:  $Gait Training: 8-22 mins $Neuromuscular Re-education: 8-22 mins                     Christene Lye, SPT Acute Rehabilitation Services [phone] Secure chat preferred     Christene Lye 07/19/2022, 2:07 PM

## 2022-07-19 NOTE — Assessment & Plan Note (Addendum)
Multiple blood pressures have been high.  Amlodipine was started by CCM.  Will take a couple days to take effect.  Believe most likely due to pain though could have some element of chronic hypertension. - s/p IV Lasix 40 mg for blood pressure and fluid overload - Continue amlodipine and start losartan 25 mg daily given persistently high blood pressure

## 2022-07-19 NOTE — Assessment & Plan Note (Signed)
Due to volume overload requiring BiPAP and transfer to ICU. Now on RA and ambulating well.  Given fluid overload, echo was obtained which demonstrated ejection fraction of 55 to 60% with LVH and G1 DD. -Monitor O2 status -Encourage PO intake -s/p IV Lasix 40 mg once 

## 2022-07-19 NOTE — Progress Notes (Addendum)
Progress Note  Primary GI: Unassigned (initially see by Dr. Barron Alvine)   LOS: 8 days   Chief Complaint: Alcoholic pancreatitis with necrosis   Subjective   Patient states he has continued abdominal pain that is similar to the past few days.  Tolerating diet without difficulty.  Was able to eat a [place] sandwich, cream of chicken, and boost shake.   Objective   Vital signs in last 24 hours: Temp:  [98.5 F (36.9 C)-99.2 F (37.3 C)] 98.8 F (37.1 C) (06/21 0721) Pulse Rate:  [93-108] 93 (06/21 0721) Resp:  [16-20] 16 (06/21 0721) BP: (137-177)/(86-113) 140/86 (06/21 0721) SpO2:  [97 %-99 %] 97 % (06/21 0721) Last BM Date : 07/18/22 Last BM recorded by nurses in past 5 days Stool Type: Type 5 (Soft blobs with clear-cut edges) (07/18/2022  8:25 AM)  General:   male in no acute distress  Heart:  Regular rate and rhythm; no murmurs Pulm: Clear anteriorly; no wheezing Abdomen: soft, nondistended, normal bowel sounds in all quadrants.  Tenderness to palpation in epigastric area extremities:  No edema Neurologic:  Alert and  oriented x4;  No focal deficits.  Psych:  Cooperative. Normal mood and affect.  Intake/Output from previous day: 06/20 0701 - 06/21 0700 In: 240 [P.O.:240] Out: 125 [Urine:125] Intake/Output this shift: No intake/output data recorded.  Studies/Results: ECHOCARDIOGRAM COMPLETE  Result Date: 07/17/2022    ECHOCARDIOGRAM REPORT   Patient Name:   TIMOTHEE GALI Date of Exam: 07/17/2022 Medical Rec #:  [id number]     Height:       67.0 in Accession #:    [id number]    Weight:       164.7 lb Date of Birth:  [birth year]     BSA:          1.862 m Patient Age:    42 years      BP:           153/105 mmHg Patient Gender: M             HR:           101 bpm. Exam Location:  Inpatient Procedure: 2D Echo, Cardiac Doppler and Color Doppler Indications:    Dyspnea  History:        Patient has no prior history of Echocardiogram examinations.                 Risk  Factors:Hypertension. Alcohol withdrawal, ETOH.  Sonographer:    Milda Smart Referring Phys: [id number] MARGARET E PRAY IMPRESSIONS  1. Left ventricular ejection fraction, by estimation, is 55 to 60%. The left ventricle has normal function. The left ventricle has no regional wall motion abnormalities. There is mild concentric left ventricular hypertrophy. Left ventricular diastolic parameters are consistent with Grade I diastolic dysfunction (impaired relaxation).  2. Right ventricular systolic function is normal. The right ventricular size is normal. Tricuspid regurgitation signal is inadequate for assessing PA pressure.  3. The mitral valve is normal in structure. No evidence of mitral valve regurgitation. No evidence of mitral stenosis.  4. The aortic valve is normal in structure. Aortic valve regurgitation is not visualized. No aortic stenosis is present.  5. Aortic dilatation noted. There is mild dilatation of the ascending aorta, measuring 40 mm.  6. The inferior vena cava is normal in size with greater than 50% respiratory variability, suggesting right atrial pressure of 3 mmHg. FINDINGS  Left Ventricle: Left ventricular ejection fraction, by estimation, is 55 to 60%. The  left ventricle has normal function. The left ventricle has no regional wall motion abnormalities. The left ventricular internal cavity size was normal in size. There is  mild concentric left ventricular hypertrophy. Left ventricular diastolic parameters are consistent with Grade I diastolic dysfunction (impaired relaxation). Normal left ventricular filling pressure. Right Ventricle: The right ventricular size is normal. No increase in right ventricular wall thickness. Right ventricular systolic function is normal. Tricuspid regurgitation signal is inadequate for assessing PA pressure. Left Atrium: Left atrial size was normal in size. Right Atrium: Right atrial size was normal in size. Pericardium: There is no evidence of pericardial  effusion. Mitral Valve: The mitral valve is normal in structure. No evidence of mitral valve regurgitation. No evidence of mitral valve stenosis. Tricuspid Valve: The tricuspid valve is normal in structure. Tricuspid valve regurgitation is not demonstrated. No evidence of tricuspid stenosis. Aortic Valve: The aortic valve is normal in structure. Aortic valve regurgitation is not visualized. No aortic stenosis is present. Pulmonic Valve: The pulmonic valve was normal in structure. Pulmonic valve regurgitation is trivial. No evidence of pulmonic stenosis. Aorta: Aortic dilatation noted. There is mild dilatation of the ascending aorta, measuring 40 mm. Venous: The inferior vena cava is normal in size with greater than 50% respiratory variability, suggesting right atrial pressure of 3 mmHg. IAS/Shunts: No atrial level shunt detected by color flow Doppler.  LEFT VENTRICLE PLAX 2D LVIDd:         4.10 cm     Diastology LVIDs:         2.90 cm     LV e' medial:    7.58 cm/s LV PW:         1.20 cm     LV E/e' medial:  7.2 LV IVS:        1.10 cm     LV e' lateral:   8.95 cm/s LVOT diam:     2.30 cm     LV E/e' lateral: 6.1 LV SV:         87 LV SV Index:   47 LVOT Area:     4.15 cm  LV Volumes (MOD) LV vol d, MOD A2C: 62.3 ml LV vol d, MOD A4C: 62.5 ml LV vol s, MOD A2C: 37.1 ml LV vol s, MOD A4C: 32.8 ml LV SV MOD A2C:     25.2 ml LV SV MOD A4C:     62.5 ml LV SV MOD BP:      27.5 ml RIGHT VENTRICLE             IVC RV S prime:     17.00 cm/s  IVC diam: 1.60 cm TAPSE (M-mode): 2.3 cm LEFT ATRIUM           Index        RIGHT ATRIUM           Index LA diam:      3.30 cm 1.77 cm/m   RA Area:     10.80 cm LA Vol (A2C): 51.4 ml 27.63 ml/m  RA Volume:   23.60 ml  12.67 ml/m LA Vol (A4C): 28.6 ml 15.36 ml/m  AORTIC VALVE LVOT Vmax:   119.00 cm/s LVOT Vmean:  77.700 cm/s LVOT VTI:    0.209 m  AORTA Ao Root diam: 3.70 cm Ao Asc diam:  4.00 cm MITRAL VALVE MV Area (PHT): 3.66 cm    SHUNTS MV Decel Time: 207 msec    Systemic VTI:   0.21 m MV E velocity: 54.80 cm/s  Systemic Diam: 2.30 cm MV A velocity: 98.10 cm/s MV E/A ratio:  0.56 Armanda Magic MD Electronically signed by Armanda Magic MD Signature Date/Time: 07/17/2022/4:18:04 PM    Final    CT ABDOMEN PELVIS W CONTRAST  Result Date: 07/17/2022 CLINICAL DATA:  Acute abdominal pain. EXAM: CT ABDOMEN AND PELVIS WITH CONTRAST TECHNIQUE: Multidetector CT imaging of the abdomen and pelvis was performed using the standard protocol following bolus administration of intravenous contrast. RADIATION DOSE REDUCTION: This exam was performed according to the departmental dose-optimization program which includes automated exposure control, adjustment of the mA and/or kV according to patient size and/or use of iterative reconstruction technique. CONTRAST:  75mL OMNIPAQUE IOHEXOL 350 MG/ML SOLN COMPARISON:  CTA chest, abdomen and pelvis from 07/11/2022 FINDINGS: Lower chest: New small bilateral pleural effusions with overlying compressive type atelectasis. Ground-glass and airspace densities noted within the anterior right lower lobe. Hepatobiliary: No focal liver abnormality is seen. No gallstones, gallbladder wall thickening, or biliary dilatation. Pancreas: Again seen are changes of severe, acute pancreatitis with necrosis involving the neck, body and proximal tail of pancreas. No main duct dilatation or enhancing mass identified. Progressive, mildly complex peripancreatic fluid is identified extending into bilateral retroperitoneum, small bowel mesentery, and lesser sac. Fluid extension along the posterior wall of the stomach into the left upper quadrant noted. No well-defined, peripherally enhancing fluid collection identified at this time to suggest mature pseudocyst or drainable abscess. Spleen: Normal in size without focal abnormality. Adrenals/Urinary Tract: Normal adrenal glands. No nephrolithiasis, hydronephrosis or suspicious mass. Urinary bladder is unremarkable. Stomach/Bowel: The stomach  appears nondistended. Mildly increased caliber small bowel loops with air-fluid levels identified which measure up to normal caliber of the colon with gas and stool noted up to the level of the rectum. Vascular/Lymphatic: Aortic atherosclerosis. Portal vein, portal venous confluence and SMV remain patent. 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 Multiple scratch set small, non pathologically enlarged abdominal lymph nodes are likely reactive. Reproductive: Prostate is unremarkable. Other: No signs of pneumoperitoneum. Musculoskeletal: No acute or significant osseous findings. IMPRESSION: 1. Again seen are changes of severe, acute pancreatitis with necrosis involving the neck, body and proximal tail of pancreas. Progressive, mildly complex peripancreatic fluid is identified extending into bilateral retroperitoneum, small bowel mesentery, and lesser sac. No well-defined, peripherally enhancing fluid collection identified at this time to suggest mature pseudocyst or drainable abscess. 2. New small bilateral pleural effusions with overlying compressive type atelectasis. Ground-glass and airspace densities noted within the anterior right lower lobe which may reflect atelectasis or pneumonia. 3. Mildly increased caliber small bowel loops with air-fluid levels identified which measure up to normal caliber of the colon with gas and stool noted up to the level of the rectum. Findings are favored to represent ileus. 4. 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 5.  Aortic Atherosclerosis (ICD10-I70.0). Electronically Signed   By: Signa Kell M.D.   On: 07/17/2022 13:58    Lab Results: Recent Labs    07/19/22 0640  WBC 14.2*  HGB 7.7*  HCT 22.4*  PLT 248   BMET Recent Labs    07/17/22 0349 07/18/22 0226 07/19/22 0640  NA 136 133* 133*  K 3.1* 3.2* 3.0*  CL 99 97* 94*  CO2 25 26 25   GLUCOSE 93 118* 118*  BUN 5* 5* <5*  CREATININE 0.66 0.70 0.66   CALCIUM 8.2* 8.3* 8.5*   LFT Recent Labs    07/17/22 0347 07/19/22 0640  PROT 5.6* 5.2*  ALBUMIN 2.1* 1.9*  AST 44* 31  ALT 36 26  ALKPHOS 122 106  BILITOT 1.2 1.2  BILIDIR 0.4*  --   IBILI 0.8  --    PT/INR No results for input(s): "LABPROT", "INR" in the last 72 hours.   Scheduled Meds:  (feeding supplement) PROSource Plus  30 mL Oral TID BM   amLODipine  5 mg Oral Daily   amoxicillin-clavulanate  1 tablet Oral Q12H   Chlorhexidine Gluconate Cloth  6 each Topical Daily   enoxaparin (LOVENOX) injection  40 mg Subcutaneous Daily   feeding supplement  1 Container Oral TID BM   feeding supplement (KATE [employer]ARMS STANDARD 1.4)  325 mL Oral Daily   folic acid  1 mg Oral Daily   lidocaine  2 patch Transdermal Q24H   losartan  25 mg Oral Daily   multivitamin with minerals  1 tablet Oral Daily   nicotine  7 mg Transdermal Daily   pantoprazole  40 mg Oral QHS   phenobarbital  32.4 mg Oral Q8H   polyethylene glycol  17 g Oral BID   potassium chloride  40 mEq Oral BID   prednisoLONE  40 mg Oral Q24H   thiamine  100 mg Oral Daily   Continuous Infusions:  sodium chloride     magnesium sulfate bolus IVPB        Patient profile:   42 year old male with history of EtOH disorder admitted for acute pancreatitis on 6/13    Impression/Plan:   Acute alcoholic pancreatitis with necrosis CT angio C/A/P 6/13 : Large amount of peripancreatic fluid consistent with acute pancreatitis. No PD dilation. Severe hepatic steatosis without mass or duct dilation. Otherwise normal-appearing GI tract.  initial lipase 1737 Lactic acid 1.0, improved from initial 8.5 on admission CT ab/pelvis w/contrast 6/19: severe acute pancreatitis with necrosis and peripancreatic fluid. There are no drainable abscesses or pseudocyst at this time  New small bilateral pleural effusions.  Increased caliber of small bowel loops suggesting ileus.  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 If unable to sustain may need to consider Dobhoff tube for post pyloric tube feeds. Currently doing well on diet thus far -Continue pain control and supportive care -With recurrence of leukocytosis, will initiate broad spectrum abx with Zosyn for full coverage.   Alcoholic hepatitis on prednisolone 40 Mg with improvement in LFTs AST 31/ALT 26/alk phos 106, improving  T Bili 1.2 Hepatitis panel was positive for hepatitis B  core IgM, HCV antibody negative.  Hepatitis B surface antibody reflects immune status -Thiamine, multivitamin, folate -Due for Lille score Saturday - Hep B DNA pending  Splenic vein thrombosis -Radiology review shows no evidence of splenic vein thrombosis.  Splenic vein is occluded secondary to inflammation from pancreatitis.  Ileus per CT - resolving Magnesium 1.6 Phosphorus 2.8 Patient reports regularly passing gas and RN documents regular bowel movements. - Increase bowel regimen to scheduled miralax BID, if continued runny stools can decrease to QD - Dulcolax suppository - Continue to maintain magnesium above 2 and potassium at 4-4.5.    Anemia Hgb 7.7 (9.0 yesterday).  MCV 98.7. - Continue daily CBC and transfuse as needed to maintain HGB > 7  - No overt bleeding.   Percell Lamboy Leanna Sato  07/19/2022, 11:49 AM

## 2022-07-19 NOTE — Assessment & Plan Note (Addendum)
Nausea and vomiting improved, though patient continues to have severe back and stomach pain. Initially CT abdomen/pelvis on admission showed signs consistent with acute pancreatitis.  Lipid panel without hypertriglyceridemia.  Right upper quadrant ultrasound with fatty liver but otherwise normal.  Repeat CTAP with severe acute pancreatitis now with necrosis and progressive mildly complex peripancreatic fluid extending into bilateral retroperitoneum.  Lactic acid normal.  GI consulted who will continue treatment with IV fluids and antibiotics (Augmentin) with bowel regimen with pain meds.  He would likely be getting core track today given decreased p.o. He did have a bump in his leukocytosis this morning, and while could be infection, consider affect of inflammation from pancreatitis and steroids. -GI following appreciate recs - Stop fluids, continue diet as tolerated, reconsider NG as needed at patient request - Cardiac telemetry  - Vitals per routine - KVO LR - Percocet, Dilaudid as needed for severe pain - Miralax daily PRN for bowel regimen - Heart diet

## 2022-07-19 NOTE — Assessment & Plan Note (Signed)
Noted on CT abdomen/pelvis. Not previously diagnosed. Low likelihood of SBP given that patient doesn't have white count or signs of bacterial infection.  Small volume on admission, will not pursue paracentesis at this time. 

## 2022-07-19 NOTE — Assessment & Plan Note (Signed)
Patient's electrolytes remain a concern in the setting of GI loss and saponification with pancreatitis and decreased PO.  K3.1>3.2>3.0, magnesium 1.6>1.6>1.4, calcium 8.2>8.3>8.5 on recent check. -AM CMP, Mg -Continue supplementation as necessary

## 2022-07-19 NOTE — Progress Notes (Signed)
Calorie Count Note  48 hour calorie count ordered.   Diet: Soft Supplements: Boost Breeze po TID, each supplement provides 250 kcal and 9 grams of protein  Breakfast: 0% Lunch: consumed sandwich and jello- 491 kcal and 21g protein Dinner: consumed cream of chicken soup and jello per pt- 175 kcal and 4g protein Supplements: 1 documented as consumed 250 kcal and 9g protein  Total intake: 916 kcal (43% of minimum estimated needs)  34 protein (38% of minimum estimated needs)  Calorie count inconclusive as all meal tickets not recorded. Spoke with pt at bedside. He reports discussing with MD this morning and wishing to hold off on Cortrak placement at this time. He plans to optimize PO intake as best as possible and re-address on Monday. Reviewed meal ordering system, pt only ordered juice for breakfast this morning. Encouraged small intake at each meal as tolerated, with addition of snacks and nutrition supplements. Expressed importance of maintaining adequate nutritional status in light of malnutrition and ongoing abdominal pain hindering adequate PO intake.   Nutrition Dx: Severe Malnutrition related to acute illness (pancreatitis) as evidenced by mild fat depletion, moderate muscle depletion, energy intake < or equal to 50% for > or equal to 5 days.   Goal: Patient will meet greater than or equal to 90% of their needs    Intervention:  Continue calorie count over the weekend; meal tickets to be collected 6/24 Boost Breeze po TID, each supplement provides 250 kcal and 9 grams of protein 30 ml ProSource Plus TID, each supplement provides 100 kcals and 15 grams protein.  [employer] 1.4 PO once daily, each supplement provides 455 kcal and 20 grams protein. Snacks BID  Drusilla Kanner, RDN, LDN Clinical Nutrition

## 2022-07-19 NOTE — Assessment & Plan Note (Signed)
Hovering around 8-9 last couple checks. Likely due to hemodilution from fluid overload.  7.7 this a.m. after more fluid administration. - Continue to monitor - Consider iron studies when not acutely ill

## 2022-07-20 LAB — VITAMIN B12: Vitamin B-12: >7500 pg/mL — ABNORMAL HIGH (ref 180–914)

## 2022-07-20 LAB — COMPREHENSIVE METABOLIC PANEL
ALT: 30 U/L (ref 0–44)
AST: 39 U/L (ref 15–41)
Albumin: 2.4 g/dL — ABNORMAL LOW (ref 3.5–5.0)
Alkaline Phosphatase: 127 U/L — ABNORMAL HIGH (ref 38–126)
Anion gap: 19 — ABNORMAL HIGH (ref 5–15)
BUN: 5 mg/dL — ABNORMAL LOW (ref 6–20)
CO2: 23 mmol/L (ref 22–32)
Calcium: 9.1 mg/dL (ref 8.9–10.3)
Chloride: 90 mmol/L — ABNORMAL LOW (ref 98–111)
Creatinine, Ser: 0.72 mg/dL (ref 0.61–1.24)
GFR, Estimated: >60 mL/min (ref >60)
Glucose, Bld: 107 mg/dL — ABNORMAL HIGH (ref 70–99)
Potassium: 3.9 mmol/L (ref 3.5–5.1)
Sodium: 132 mmol/L — ABNORMAL LOW (ref 135–145)
Total Bilirubin: 1.3 mg/dL — ABNORMAL HIGH (ref 0.3–1.2)
Total Protein: 6.9 g/dL (ref 6.5–8.1)

## 2022-07-20 LAB — CBC WITH DIFFERENTIAL/PLATELET
Abs Immature Granulocytes: 0.11 10*3/uL — ABNORMAL HIGH (ref 0.00–0.07)
Basophils Absolute: 0 10*3/uL (ref 0.0–0.1)
Basophils Relative: 0 %
Eosinophils Absolute: 0 10*3/uL (ref 0.0–0.5)
Eosinophils Relative: 0 %
HCT: 27.6 % — ABNORMAL LOW (ref 39.0–52.0)
Hemoglobin: 9.2 g/dL — ABNORMAL LOW (ref 13.0–17.0)
Immature Granulocytes: 1 %
Lymphocytes Relative: 11 %
Lymphs Abs: 1.7 10*3/uL (ref 0.7–4.0)
MCH: 33.2 pg (ref 26.0–34.0)
MCHC: 33.3 g/dL (ref 30.0–36.0)
MCV: 99.6 fL (ref 80.0–100.0)
Monocytes Absolute: 0.8 10*3/uL (ref 0.1–1.0)
Monocytes Relative: 5 %
Neutro Abs: 13 10*3/uL — ABNORMAL HIGH (ref 1.7–7.7)
Neutrophils Relative %: 83 %
Platelets: 328 10*3/uL (ref 150–400)
RBC: 2.77 MIL/uL — ABNORMAL LOW (ref 4.22–5.81)
RDW: 13.1 % (ref 11.5–15.5)
WBC: 15.6 10*3/uL — ABNORMAL HIGH (ref 4.0–10.5)
nRBC: 0.2 % (ref 0.0–0.2)

## 2022-07-20 LAB — PROTIME-INR
INR: 1.1 (ref 0.8–1.2)
Prothrombin Time: 13.9 seconds (ref 11.4–15.2)

## 2022-07-20 LAB — MAGNESIUM: Magnesium: 1.8 mg/dL (ref 1.7–2.4)

## 2022-07-20 MED ORDER — LIDOCAINE 5 % EX PTCH
1.0000 | MEDICATED_PATCH | CUTANEOUS | Status: DC
  Administered 2022-07-20 – 2022-07-23 (×4): 1 via TRANSDERMAL
  Filled 2022-07-20 (×4): qty 1

## 2022-07-20 MED ORDER — PANCRELIPASE (LIP-PROT-AMYL) 36000-114000 UNITS PO CPEP
36000.0000 [IU] | ORAL_CAPSULE | Freq: Three times a day (TID) | ORAL | Status: DC
  Administered 2022-07-20 – 2022-07-23 (×10): 36000 [IU] via ORAL
  Filled 2022-07-20 (×10): qty 1

## 2022-07-20 NOTE — Assessment & Plan Note (Signed)
Noted on CT abdomen/pelvis. Not previously diagnosed. Low likelihood of SBP given that patient doesn't have white count or signs of bacterial infection.  Small volume on admission, will not pursue paracentesis at this time.

## 2022-07-20 NOTE — Progress Notes (Addendum)
Daily Progress Note Intern Pager: [phone]  Patient name: Alexander [name] Medical record number: [id number] Date of birth: 08/01/1980 Age: 42 y.o. Gender: male  Primary Care Provider: Patient, No Pcp Per Consultants: CCM in ER, GI (S/O) Code Status: Full   Pt Overview and Major Events to Date:  6/13: Admitted 6/15: admitted to ICU, placed on precedex/BiPAP 6/19: back to FMTS on room air, phenobarb taper   Assessment and Plan: Alexander [name] is a 42 y.o. male who presented with abdominal pain, nausea and vomiting in the setting of acute pancreatitis and alcohol use continuing to be managed for pancreatitis and alcohol withdrawal. 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  Hospital Problem List      Hospital     * (Principal) Acute pancreatitis     Nausea and vomiting improved, though patient continues to have severe  back and stomach pain. Initially CT abdomen/pelvis on admission showed  signs consistent with acute pancreatitis.  Lipid panel without  hypertriglyceridemia.  Right upper quadrant ultrasound with fatty liver  but otherwise normal.  Repeat CTAP with severe acute pancreatitis now with  necrosis and progressive mildly complex peripancreatic fluid extending  into bilateral retroperitoneum.  Lactic acid normal.  GI consulted who  will continue treatment with IV fluids and antibiotics (now  augmentin>Zosyn due to leukocytosis. -GI following appreciate recs -Continue Zosyn - Continue diet as tolerated, reconsider NG as needed at patient request - Percocet, Dilaudid as needed for severe pain - Miralax daily PRN for bowel regimen        Alcohol withdrawal (HCC)     Patient drinks 8oz of gin anywhere from 5-10 times daily, last drink  was 6/12 PM. Needed precedex and transferred to ICU. Now on phenobarb  taper and tolerating well, CIWA 2>0. - CIWA protocol - Continue phenobarb until taper ends on 6/23 - Monitor BMPs - Cardiac  telemetry - Thiamine 100 mg, Folic acid 1 mg,  MVI daily         Anemia     Hovering around 8-9 last couple checks. Likely due to hemodilution from  fluid overload.  7.7 this a.m. after more fluid administration.  Intermittently macrocytic which is likely in setting of alcohol use. - Continue to monitor - Vitamin B12 today given some concern for neuropathy associated with  alcohol - Consider iron studies when not acutely ill        Prolonged QT interval     Likely secondary to electrolyte abnormalities from recent GI vomiting.  Most recent QTC 480s.        Hepatic steatosis     Evidence of severe hepatic steatosis incidentally found on CT  abdomen/pelvis.  Right upper quadrant sound with confirmatory findings.   Patient at risk for liver pathology given elevated liver enzymes in the  setting of chronic heavy alcoholism and also found to have small volume of  ascites on imaging as well.  Hepatitis panel was positive for hepatitis B  core IgM, HCV antibody negative.  Hepatitis B surface antibody reflects  immune status. LFTs coming down after fluids for pancreatitis. - Continue prednisolone for alcoholic hepatitis, repeat Lille score as  able with updated PT/INR and CMP - Hep B DNA negative         Ascites     Noted on CT abdomen/pelvis. Not previously diagnosed. Low likelihood of  SBP given that patient doesn't have white count or signs of bacterial  infection.  Small volume  on admission, will not pursue paracentesis at  this time.        Electrolyte abnormality     Patient's electrolytes remain a concern in the setting of GI loss and  saponification with pancreatitis and decreased PO.  K3.1>3.2>3.0,  magnesium 1.6>1.6>1.4, calcium 8.2>8.3>8.5 on recent check. 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  GI consulted and  recommended heme-onc evaluation  who stated not true occlusion but due to inflammation of the vessel.        Protein-calorie malnutrition, severe   FEN/GI: Likely cortrak today PPx: Lovenox Dispo: Pending clinical improvement  Subjective:  Still in pain but much improved. Able to walk now around the unit. Has been eating more. Does not want cortrak. Pain meds have been helping. Would like to go home next week as he can. Is having tingling in his BLE.  Objective: Temp:  [99 F (37.2 C)-99.7 F (37.6 C)] 99 F (37.2 C) (06/22 0826) Pulse Rate:  [96] 96 (06/22 0430) Resp:  [16-18] 17 (06/22 0826) BP: (134-158)/(92-108) 158/108 (06/22 0826) SpO2:  [97 %-100 %] 100 % (06/22 0826) Physical Exam: General: Moving in bed without pain, in NAD Cardiovascular: RRR without m/r/g Respiratory: CTAB, no w/r/r Abdomen: Soft, tender to palpation diffusely, normoactive bowel sounds Extremities: Moves all extremities grossly equally  Laboratory: Most recent CBC Lab Results  Component Value Date   WBC 14.2 (H) 07/19/2022   HGB 7.7 (L) 07/19/2022   HCT 22.4 (L) 07/19/2022   MCV 98.7 07/19/2022   PLT 248 07/19/2022   Most recent BMP    Latest Ref Rng & Units 07/19/2022    6:40 AM  BMP  Glucose 70 - 99 mg/dL 213   BUN 6 - 20 mg/dL <5   Creatinine 0.86 - 1.24 mg/dL 5.78   Sodium 469 - 629 mmol/L 133   Potassium 3.5 - 5.1 mmol/L 3.0   Chloride 98 - 111 mmol/L 94   CO2 22 - 32 mmol/L 25   Calcium 8.9 - 10.3 mg/dL 8.5    Evette Georges, MD 07/20/2022, 9:55 AM PGY-1, [facility] FPTS Intern pager: [phone], text pages welcome Secure chat group [facility] Teaching Service

## 2022-07-20 NOTE — Assessment & Plan Note (Signed)
Patient drinks 8oz of gin anywhere from 5-10 times daily, last drink was 6/12 PM. Needed precedex and transferred to ICU. Now on phenobarb taper and tolerating well, CIWA 2>0. - CIWA protocol - Continue phenobarb until taper ends on 6/23 - Monitor BMPs - Cardiac telemetry - Thiamine 100 mg, Folic acid 1 mg,  MVI daily

## 2022-07-20 NOTE — Assessment & Plan Note (Signed)
Nausea and vomiting improved, though patient continues to have severe back and stomach pain. Initially CT abdomen/pelvis on admission showed signs consistent with acute pancreatitis.  Lipid panel without hypertriglyceridemia.  Right upper quadrant ultrasound with fatty liver but otherwise normal.  Repeat CTAP with severe acute pancreatitis now with necrosis and progressive mildly complex peripancreatic fluid extending into bilateral retroperitoneum.  Lactic acid normal.  GI consulted who will continue treatment with IV fluids and antibiotics (now augmentin>Zosyn due to leukocytosis. -GI following appreciate recs -Continue Zosyn - Continue diet as tolerated, reconsider NG as needed at patient request - Percocet, Dilaudid as needed for severe pain - Miralax daily PRN for bowel regimen

## 2022-07-20 NOTE — Assessment & Plan Note (Signed)
Evidence of severe hepatic steatosis incidentally found on CT abdomen/pelvis.  Right upper quadrant sound with confirmatory findings.  Patient at risk for liver pathology given elevated liver enzymes in the setting of chronic heavy alcoholism and also found to have small volume of ascites on imaging as well.  Hepatitis panel was positive for hepatitis B core IgM, HCV antibody negative.  Hepatitis B surface antibody reflects immune status. LFTs coming down after fluids for pancreatitis. - Continue prednisolone for alcoholic hepatitis, repeat Lille score as able with updated PT/INR and CMP

## 2022-07-20 NOTE — Assessment & Plan Note (Signed)
Multiple blood pressures have been high.  Amlodipine was started by CCM.  Will take a couple days to take effect.  Believe most likely due to pain though could have some element of chronic hypertension. - Continue amlodipine and losartan 25 mg

## 2022-07-20 NOTE — Assessment & Plan Note (Signed)
Likely secondary to electrolyte abnormalities from recent GI vomiting. Most recent QTC 480s. 

## 2022-07-20 NOTE — Assessment & Plan Note (Signed)
Patient's electrolytes remain a concern in the setting of GI loss and saponification with pancreatitis and decreased PO.  K3.1>3.2>3.0, magnesium 1.6>1.6>1.4, calcium 8.2>8.3>8.5 on recent check. -AM CMP, Mg -Continue supplementation as necessary 

## 2022-07-20 NOTE — Assessment & Plan Note (Signed)
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  GI consulted and recommended heme-onc evaluation who stated not true occlusion but due to inflammation of the vessel.

## 2022-07-20 NOTE — Progress Notes (Signed)
Progress Note  Primary GI: Unassigned (initially see by Dr. Barron Alvine)   LOS: 9 days   Chief Complaint: Alcoholic pancreatitis with necrosis   Subjective   Abdominal pain is still stable.  He has been able to eat a large breakfast.  He is motivated and would like to avoid putting in an Dobhoff tube if possible   Objective   Vital signs in last 24 hours: Temp:  [98.7 F (37.1 C)-99.7 F (37.6 C)] 98.7 F (37.1 C) (06/22 1416) Pulse Rate:  [96] 96 (06/22 0430) Resp:  [16-18] 17 (06/22 1416) BP: (121-158)/(90-108) 121/90 (06/22 1416) SpO2:  [98 %-100 %] 100 % (06/22 1416) Last BM Date : 07/18/22 Last BM recorded by nurses in past 5 days Stool Type: Type 5 (Soft blobs with clear-cut edges) (07/18/2022  8:25 AM)  General:   male in no acute distress  Heart:  Regular rate and rhythm; no murmurs Pulm: Clear anteriorly; no wheezing Abdomen: soft, nondistended, normal bowel sounds in all quadrants.  Tender in the epigastric area Neurologic:  Alert and  oriented x4;  No focal deficits.  Psych:  Cooperative. Normal mood and affect.  Intake/Output from previous day: 06/21 0701 - 06/22 0700 In: 480 [P.O.:480] Out: -  Intake/Output this shift: Total I/O In: 271.1 [P.O.:120; IV Piggyback:151.1] Out: -   Studies/Results: No results found.  Lab Results: Recent Labs    07/19/22 0640 07/20/22 1011  WBC 14.2* 15.6*  HGB 7.7* 9.2*  HCT 22.4* 27.6*  PLT 248 328   BMET Recent Labs    07/18/22 0226 07/19/22 0640 07/20/22 1011  NA 133* 133* 132*  K 3.2* 3.0* 3.9  CL 97* 94* 90*  CO2 26 25 23   GLUCOSE 118* 118* 107*  BUN 5* <5* 5*  CREATININE 0.70 0.66 0.72  CALCIUM 8.3* 8.5* 9.1   LFT Recent Labs    07/20/22 1011  PROT 6.9  ALBUMIN 2.4*  AST 39  ALT 30  ALKPHOS 127*  BILITOT 1.3*   PT/INR Recent Labs    07/20/22 1011  LABPROT 13.9  INR 1.1     Scheduled Meds:  (feeding supplement) PROSource Plus  30 mL Oral TID BM   amLODipine  5 mg Oral Daily    Chlorhexidine Gluconate Cloth  6 each Topical Daily   enoxaparin (LOVENOX) injection  40 mg Subcutaneous Daily   feeding supplement  1 Container Oral TID BM   feeding supplement (KATE FARMS STANDARD 1.4)  325 mL Oral Daily   folic acid  1 mg Oral Daily   lidocaine  1 patch Transdermal Q24H   lidocaine  2 patch Transdermal Q24H   losartan  25 mg Oral Daily   multivitamin with minerals  1 tablet Oral Daily   nicotine  7 mg Transdermal Daily   pantoprazole  40 mg Oral QHS   phenobarbital  32.4 mg Oral Q8H   polyethylene glycol  17 g Oral BID   prednisoLONE  40 mg Oral Q24H   thiamine  100 mg Oral Daily   Continuous Infusions:  sodium chloride     piperacillin-tazobactam (ZOSYN)  IV 3.375 g (07/20/22 1454)      Patient profile:   42 year old male with history of EtOH disorder admitted for acute pancreatitis on 6/13    Impression/Plan:   Acute alcoholic pancreatitis with necrosis CT angio C/A/P 6/13 : Large amount of peripancreatic fluid consistent with acute pancreatitis. No PD dilation. Severe hepatic steatosis without mass or duct dilation. Otherwise normal-appearing GI  tract.  initial lipase 1737 Lactic acid 1.0, improved from initial 8.5 on admission CT ab/pelvis w/contrast 6/19: severe acute pancreatitis with necrosis and peripancreatic fluid. There are no drainable abscesses or pseudocyst at this time.  New small bilateral pleural effusions.  Increased caliber of small bowel loops suggesting ileus.  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  Continue to encourage adequate nutritional intake. Will start Creon due to increased PO intake. -Continue pain control and supportive care -Continue Zosyn for now. Can de-escalate once patient's leukocytosis improves and patient's ab pain improves. -If patient does not demonstrate improvement over time, could consider a repeat CT scan  in 3-4 days -We will arrange for GI clinic follow up  -GI will sign off at this time. Please feel free to call back if any new questions arise.   Alcoholic hepatitis Hepatitis panel was positive for hepatitis B core IgM. HCV antibody negative.  Hepatitis B surface antibody reflects immune status. HBV DNA was negative. Lille score is <0.45, suggesting a response to prednisolone - Recommend continuing prednisolone 40 mg to complete a 28 day course, then decrease by 10 mg weekly until off  Splenic vein thrombosis -Radiology review shows no evidence of splenic vein thrombosis.  Splenic vein is occluded secondary to inflammation from pancreatitis.  Ileus per CT - resolving Patient reports regularly passing gas and RN documents regular bowel movements. - Continue Miralax - Continue to maintain magnesium above 2 and potassium at 4-4.5.    Imogene Burn  07/20/2022, 5:02 PM

## 2022-07-20 NOTE — Assessment & Plan Note (Signed)
Due to volume overload requiring BiPAP and transfer to ICU. Now on RA and ambulating well.  Given fluid overload, echo was obtained which demonstrated ejection fraction of 55 to 60% with LVH and G1 DD. -Monitor O2 status -Encourage PO intake -s/p IV Lasix 40 mg once 

## 2022-07-20 NOTE — Assessment & Plan Note (Addendum)
Hovering around 8-9 last couple checks. Likely due to hemodilution from fluid overload.  7.7 this a.m. after more fluid administration. Intermittently macrocytic which is likely in setting of alcohol use. - Continue to monitor - Vitamin B12 today given some concern for neuropathy associated with alcohol - Consider iron studies when not acutely ill

## 2022-07-21 LAB — COMPREHENSIVE METABOLIC PANEL
ALT: 23 U/L (ref 0–44)
AST: 25 U/L (ref 15–41)
Albumin: 2.1 g/dL — ABNORMAL LOW (ref 3.5–5.0)
Alkaline Phosphatase: 111 U/L (ref 38–126)
Anion gap: 10 (ref 5–15)
BUN: <5 mg/dL — ABNORMAL LOW (ref 6–20)
CO2: 26 mmol/L (ref 22–32)
Calcium: 8.6 mg/dL — ABNORMAL LOW (ref 8.9–10.3)
Chloride: 95 mmol/L — ABNORMAL LOW (ref 98–111)
Creatinine, Ser: 0.55 mg/dL — ABNORMAL LOW (ref 0.61–1.24)
GFR, Estimated: >60 mL/min (ref >60)
Glucose, Bld: 125 mg/dL — ABNORMAL HIGH (ref 70–99)
Potassium: 2.8 mmol/L — ABNORMAL LOW (ref 3.5–5.1)
Sodium: 131 mmol/L — ABNORMAL LOW (ref 135–145)
Total Bilirubin: 0.9 mg/dL (ref 0.3–1.2)
Total Protein: 5.9 g/dL — ABNORMAL LOW (ref 6.5–8.1)

## 2022-07-21 LAB — CBC WITH DIFFERENTIAL/PLATELET
Abs Immature Granulocytes: 0.17 10*3/uL — ABNORMAL HIGH (ref 0.00–0.07)
Basophils Absolute: 0 10*3/uL (ref 0.0–0.1)
Basophils Relative: 0 %
Eosinophils Absolute: 0 10*3/uL (ref 0.0–0.5)
Eosinophils Relative: 0 %
HCT: 23.2 % — ABNORMAL LOW (ref 39.0–52.0)
Hemoglobin: 7.7 g/dL — ABNORMAL LOW (ref 13.0–17.0)
Immature Granulocytes: 1 %
Lymphocytes Relative: 10 %
Lymphs Abs: 1.3 10*3/uL (ref 0.7–4.0)
MCH: 33.3 pg (ref 26.0–34.0)
MCHC: 33.2 g/dL (ref 30.0–36.0)
MCV: 100.4 fL — ABNORMAL HIGH (ref 80.0–100.0)
Monocytes Absolute: 0.8 10*3/uL (ref 0.1–1.0)
Monocytes Relative: 6 %
Neutro Abs: 11.2 10*3/uL — ABNORMAL HIGH (ref 1.7–7.7)
Neutrophils Relative %: 83 %
Platelets: 342 10*3/uL (ref 150–400)
RBC: 2.31 MIL/uL — ABNORMAL LOW (ref 4.22–5.81)
RDW: 13.2 % (ref 11.5–15.5)
WBC: 13.5 10*3/uL — ABNORMAL HIGH (ref 4.0–10.5)
nRBC: 0 % (ref 0.0–0.2)

## 2022-07-21 LAB — MAGNESIUM: Magnesium: 1.7 mg/dL (ref 1.7–2.4)

## 2022-07-21 MED ORDER — MAGNESIUM SULFATE 2 GM/50ML IV SOLN
2.0000 g | Freq: Once | INTRAVENOUS | Status: AC
  Administered 2022-07-21: 2 g via INTRAVENOUS
  Filled 2022-07-21: qty 50

## 2022-07-21 MED ORDER — POTASSIUM CHLORIDE CRYS ER 20 MEQ PO TBCR
40.0000 meq | EXTENDED_RELEASE_TABLET | Freq: Two times a day (BID) | ORAL | Status: AC
  Administered 2022-07-21 (×2): 40 meq via ORAL
  Filled 2022-07-21 (×2): qty 2

## 2022-07-21 MED ORDER — POTASSIUM CHLORIDE 20 MEQ PO PACK
40.0000 meq | PACK | Freq: Two times a day (BID) | ORAL | Status: DC
  Filled 2022-07-21: qty 2

## 2022-07-21 MED ORDER — GABAPENTIN 100 MG PO CAPS
100.0000 mg | ORAL_CAPSULE | Freq: Three times a day (TID) | ORAL | Status: DC
  Administered 2022-07-21 – 2022-07-23 (×8): 100 mg via ORAL
  Filled 2022-07-21 (×8): qty 1

## 2022-07-21 MED ORDER — OXYCODONE-ACETAMINOPHEN 5-325 MG PO TABS
1.0000 | ORAL_TABLET | ORAL | Status: DC | PRN
  Administered 2022-07-21 – 2022-07-22 (×5): 2 via ORAL
  Filled 2022-07-21 (×5): qty 2

## 2022-07-21 NOTE — Assessment & Plan Note (Addendum)
VSS. Slow improvement in pain. Wbc downtrending. -GI following appreciate recs -Stop Zosyn - Continue diet as tolerated, reconsider NG as needed at patient request - Percocet prn, stop Dilaudid  - Miralax daily PRN for bowel regimen

## 2022-07-21 NOTE — Assessment & Plan Note (Signed)
Mildly elevated. Consider increasing losartan. - Continue amlodipine and losartan 25 mg

## 2022-07-21 NOTE — Assessment & Plan Note (Addendum)
CIWA scores 0.  - CIWA protocol - Continue phenobarb taper ends today 6/23 - Monitor BMPs - Cardiac telemetry - Thiamine 100 mg, Folic acid 1 mg,  MVI daily

## 2022-07-21 NOTE — Assessment & Plan Note (Deleted)
Likely secondary to electrolyte abnormalities from recent GI vomiting. Most recent QTC 480s. 

## 2022-07-21 NOTE — Assessment & Plan Note (Addendum)
Patient's electrolytes remain a concern in the setting of GI loss and saponification with pancreatitis and decreased PO.  K3.0>3.9>2.8, magnesium 1.4>1.8>1.7. -AM CMP, Mg -Continue supplementation as necessary

## 2022-07-21 NOTE — Assessment & Plan Note (Signed)
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  GI consulted and recommended heme-onc evaluation who stated not true occlusion but due to inflammation of the vessel. 

## 2022-07-21 NOTE — Assessment & Plan Note (Addendum)
AST/ALT within normal limits. - Continue prednisolone for alcoholic hepatitis - Will need 28 day course, then taper weekly per GI 

## 2022-07-21 NOTE — Progress Notes (Signed)
Daily Progress Note Intern Pager: [phone]  Patient name: Alexander [name] Medical record number: [id number] Date of birth: 27-Dec-1980 Age: 42 y.o. Gender: male  Primary Care Provider: Patient, No Pcp Per Consultants: CCM, GI Code Status: Full  Pt Overview and Major Events to Date:  6/13: Admitted 6/15: Admitted to ICU, placed on precedex/BiPAP 6/19: back to FMTS on room air, phenobarb taper  Assessment and Plan: Alexander [name] is a 42 y.o. male admitted for acute pancreatitis in the setting of alcohol withdrawal and alcoholic hepatitis. 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   Hospital Problem List      Hospital     * (Principal) Acute pancreatitis     VSS. Slow improvement in pain. Wbc downtrending. -GI following appreciate recs -Stop Zosyn - Continue diet as tolerated, reconsider NG as needed at patient request - Percocet prn, stop Dilaudid  - Miralax daily PRN for bowel regimen        Alcohol withdrawal (HCC)     CIWA scores 0.  - CIWA protocol - Continue phenobarb taper ends today 6/23 - Monitor BMPs - Cardiac telemetry - Thiamine 100 mg, Folic acid 1 mg,  MVI daily        Anemia     Hovering around 8-9 last couple checks. Likely due to hemodilution from  fluid overload.  7.7 this a.m. after more fluid administration.  Intermittently macrocytic which is likely in setting of alcohol use. - Continue to monitor - Vitamin B12 today given some concern for neuropathy associated with  alcohol - Consider iron studies when not acutely ill        Alcoholic hepatitis     AST/ALT within normal limits. - Continue prednisolone for alcoholic hepatitis - Will need 28 day course, then taper [name] per GI        Ascites     Noted on CT abdomen/pelvis. Not previously diagnosed. Low likelihood of  SBP given that patient doesn't have white count or signs of bacterial  infection.  Small volume on admission, will not pursue paracentesis  at  this time. -Outpatient f/u        Electrolyte abnormality     Patient's electrolytes remain a concern in the setting of GI loss and  saponification with pancreatitis and decreased PO.  K3.0>3.9>2.8,  magnesium 1.4>1.8>1.7. -AM CMP, Mg -Continue supplementation as necessary        Hypertension     Mildly elevated. Consider increasing losartan. 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  GI consulted and recommended heme-onc evaluation  who stated not true occlusion but due to inflammation of the vessel.        Protein-calorie malnutrition, severe    FEN/GI: Soft diet PPx: Lovenox Dispo: Pending clinical improvement  Subjective:  NAEO. Feels abdominal pain is still unchanged, but doing better overall.  Going to try eating toast today.  Encouraged continued nutrition as patient is severely malnourished.  Objective: Temp:  [98.5 F (36.9 C)-99.5 F (37.5 C)] 99.5 F (37.5 C) (06/23 0733) Pulse Rate:  [85-95] 95 (06/23 0733) Resp:  [16-17] 16 (06/23 0733) BP: (121-157)/(84-99) 136/91 (06/23 0733) SpO2:  [96 %-100 %] 96 % (06/23 0733) Physical Exam: General: NAD, resting comfortably in hospital bed Neuro: A&O Cardiovascular: RRR, no murmurs, no peripheral edema  Respiratory: normal WOB on RA, CTAB, no wheezes, ronchi or rales Abdomen: Firm, nondistended, diffusely moderately tender to palpation, no rebound or guarding Extremities: Moving all 4 extremities equally   Laboratory: Most recent CBC Lab Results  Component Value Date   WBC 13.5 (H) 07/21/2022   HGB 7.7 (L) 07/21/2022   HCT 23.2 (L) 07/21/2022   MCV 100.4 (H) 07/21/2022   PLT 342 07/21/2022   Most recent BMP    Latest Ref Rng & Units 07/21/2022    1:07 AM  BMP  Glucose 70 - 99 mg/dL 409   BUN 6 - 20 mg/dL <5   Creatinine 8.11 - 1.24 mg/dL 9.14   Sodium 782 -  956 mmol/L 131   Potassium 3.5 - 5.1 mmol/L 2.8   Chloride 98 - 111 mmol/L 95   CO2 22 - 32 mmol/L 26   Calcium 8.9 - 10.3 mg/dL 8.6     Mag - 1.7  Imaging/Diagnostic Tests: No new imaging.  Celine Mans, MD 07/21/2022, 11:39 AM  PGY-1, [facility] FPTS Intern pager: [phone], text pages welcome Secure chat group [facility] Teaching Service

## 2022-07-21 NOTE — Assessment & Plan Note (Addendum)
Noted on CT abdomen/pelvis. Not previously diagnosed. Low likelihood of SBP given that patient doesn't have white count or signs of bacterial infection.  Small volume on admission, will not pursue paracentesis at this time. -Outpatient f/u

## 2022-07-21 NOTE — Assessment & Plan Note (Signed)
Hovering around 8-9 last couple checks. Likely due to hemodilution from fluid overload.  7.7 this a.m. after more fluid administration. Intermittently macrocytic which is likely in setting of alcohol use. - Continue to monitor - Vitamin B12 today given some concern for neuropathy associated with alcohol - Consider iron studies when not acutely ill 

## 2022-07-22 ENCOUNTER — Telehealth: Payer: Self-pay

## 2022-07-22 LAB — CBC WITH DIFFERENTIAL/PLATELET
Abs Immature Granulocytes: 0 10*3/uL (ref 0.00–0.07)
Basophils Absolute: 0.1 10*3/uL (ref 0.0–0.1)
Basophils Relative: 1 %
Eosinophils Absolute: 0 10*3/uL (ref 0.0–0.5)
Eosinophils Relative: 0 %
HCT: 21.6 % — ABNORMAL LOW (ref 39.0–52.0)
Hemoglobin: 7.2 g/dL — ABNORMAL LOW (ref 13.0–17.0)
Lymphocytes Relative: 3 %
Lymphs Abs: 0.4 10*3/uL — ABNORMAL LOW (ref 0.7–4.0)
MCH: 34 pg (ref 26.0–34.0)
MCHC: 33.3 g/dL (ref 30.0–36.0)
MCV: 101.9 fL — ABNORMAL HIGH (ref 80.0–100.0)
Monocytes Absolute: 0.3 10*3/uL (ref 0.1–1.0)
Monocytes Relative: 2 %
Neutro Abs: 12.5 10*3/uL — ABNORMAL HIGH (ref 1.7–7.7)
Neutrophils Relative %: 94 %
Platelets: 398 10*3/uL (ref 150–400)
RBC: 2.12 MIL/uL — ABNORMAL LOW (ref 4.22–5.81)
RDW: 13.4 % (ref 11.5–15.5)
WBC: 13.3 10*3/uL — ABNORMAL HIGH (ref 4.0–10.5)
nRBC: 0 % (ref 0.0–0.2)
nRBC: 0 /100 WBC

## 2022-07-22 LAB — COMPREHENSIVE METABOLIC PANEL
ALT: 21 U/L (ref 0–44)
AST: 19 U/L (ref 15–41)
Albumin: 1.8 g/dL — ABNORMAL LOW (ref 3.5–5.0)
Alkaline Phosphatase: 95 U/L (ref 38–126)
Anion gap: 9 (ref 5–15)
BUN: 5 mg/dL — ABNORMAL LOW (ref 6–20)
CO2: 26 mmol/L (ref 22–32)
Calcium: 8.4 mg/dL — ABNORMAL LOW (ref 8.9–10.3)
Chloride: 99 mmol/L (ref 98–111)
Creatinine, Ser: 0.58 mg/dL — ABNORMAL LOW (ref 0.61–1.24)
GFR, Estimated: >60 mL/min (ref >60)
Glucose, Bld: 111 mg/dL — ABNORMAL HIGH (ref 70–99)
Potassium: 3.1 mmol/L — ABNORMAL LOW (ref 3.5–5.1)
Sodium: 134 mmol/L — ABNORMAL LOW (ref 135–145)
Total Bilirubin: 0.8 mg/dL (ref 0.3–1.2)
Total Protein: 5.3 g/dL — ABNORMAL LOW (ref 6.5–8.1)

## 2022-07-22 LAB — MAGNESIUM: Magnesium: 1.5 mg/dL — ABNORMAL LOW (ref 1.7–2.4)

## 2022-07-22 MED ORDER — MAGNESIUM SULFATE 4 GM/100ML IV SOLN
4.0000 g | Freq: Once | INTRAVENOUS | Status: AC
  Administered 2022-07-22: 4 g via INTRAVENOUS
  Filled 2022-07-22: qty 100

## 2022-07-22 MED ORDER — KATE FARMS STANDARD 1.4 PO LIQD
325.0000 mL | Freq: Two times a day (BID) | ORAL | Status: DC
  Administered 2022-07-22 – 2022-07-23 (×2): 325 mL via ORAL
  Filled 2022-07-22 (×3): qty 325

## 2022-07-22 MED ORDER — OXYCODONE-ACETAMINOPHEN 5-325 MG PO TABS
1.0000 | ORAL_TABLET | Freq: Four times a day (QID) | ORAL | Status: DC | PRN
  Administered 2022-07-22 – 2022-07-23 (×5): 2 via ORAL
  Filled 2022-07-22 (×5): qty 2

## 2022-07-22 MED ORDER — POTASSIUM CHLORIDE CRYS ER 20 MEQ PO TBCR
40.0000 meq | EXTENDED_RELEASE_TABLET | ORAL | Status: AC
  Administered 2022-07-22 (×3): 40 meq via ORAL
  Filled 2022-07-22 (×3): qty 2

## 2022-07-22 MED ORDER — OXYCODONE-ACETAMINOPHEN 5-325 MG PO TABS
1.0000 | ORAL_TABLET | Freq: Four times a day (QID) | ORAL | Status: DC | PRN
  Filled 2022-07-22: qty 1

## 2022-07-22 MED ORDER — BOOST / RESOURCE BREEZE PO LIQD CUSTOM
1.0000 | ORAL | Status: DC
  Filled 2022-07-22: qty 1

## 2022-07-22 NOTE — Discharge Instructions (Signed)
Pancreatitis Nutrition Therapy  The pancreas helps your body digest and absorb nutrients in food. Pancreatitis prevents the body from digesting food well, especially if the food is high in fat.  This nutrition therapy limits the fat in your diet while providing nutrients you need. Your goal is to eat as near to a normal diet as possible without experiencing gastrointestinal (GI) symptoms. These symptoms include stomach pain, bloating, weight loss or difficulty maintaining weight, vomiting, burping, loose stools, and steatorrhea. The effects of pancreatitis are different for all individuals, so it is important to work with your registered dietitian nutritionist (RDN) to determine which foods trigger your GI symptoms. Your RDN can also help you figure out your tolerance to fat in foods and how to manage your symptoms with your diet.  Tips You may need to take pancreatic enzymes if you have frequent, loose stools after mealtimes. Individuals who take pancreatic enzymes will need to take the prescribed dosage at the start of a meal or snack. Individuals who are prescribed a low-fat diet will need to limit fats and oils to no more than 6 teaspoons (30 grams) daily. Up to 1 ounce of avocado can also be substituted for 1 teaspoon of fat. A low-fat diet may not be needed if you are taking pancreatic enzymes. Avoid drinking alcohol. Keep a bottle of water with you at all times to stay hydrated and ensure you are getting in enough fluid each day. The general recommendation is to drink 8 cups (64 ounces) of fluids daily. Eat small, frequent meals (4-6) throughout the day to help you recover from pancreatitis or to maintain your normal body weight. Eat more whole fruits and vegetables rather than drinking fruit and vegetable juices.  Fiber is found in whole grain foods and slows digestion. You may need to choose whole grain foods less often if you feel full quickly after eating. Ask your RDN for recommendations on  managing your diet if you also have other conditions, such as diabetes mellitus. Your RDN might recommend a vitamin and mineral supplement or fat-soluble vitamin supplements if you require higher amounts of these nutrients.  Foods Recommended and Foods Not Recommended The following list of foods may be helpful if you need to limit your fat intake. Food Group Foods Recommended Foods Not Recommended  Grains Breads: Bagels, buns, English muffins Hot/cold cereals Couscous Low-fat crackers Pancakes Pasta Popcorn, air popped Rice Corn or flour tortilla Products made with added fat (such as biscuits, waffles, and regular crackers) High-fat bakery products such as doughnuts, biscuits, croissants, Danish pastries, pies, cookies Snacks made with partially hydrogenated oils including chips, cheese puffs, snack mixes, regular crackers, butter-flavored popcorn  Protein Foods Lean cuts of poultry (without skin) such as chicken or turkey Low-fat hamburger (for example, 7% fat) Lean cuts of fish (white fish) Canned tuna in water Egg whites or egg substitute Lean deli meats such as turkey, chicken, lean beef Non-animal protein sources (tofu, legumes, beans, lentils) Smooth nut butters     Higher-fat cuts of meats such as ribs, T-bone steak, regular hamburger (15% to 20% fat) Full-fat processed meats (hot dogs, bologna, salami, sausage, bacon, etc) Red meats Organ meats (liver, brains, sweetbreads) Poultry with skin Fried meat, poultry, tofu, and fish Whole eggs and egg yolks Full-fat refried beans Tree nuts and peanuts  Dairy and Dairy Alternatives 1% or fat-free dairy (milk, yogurt, cheese, cottage cheese, sour cream) Frozen yogurt Fortified non-dairy milk (almond, rice, soy, etc.) Creamy/cheesy sauces Cream Whole-fat or reduced-fat (2%) dairy (  milk, yogurt, ice cream, cheese) Milkshakes Half-and-half Cream cheese Sour cream Coconut milk  Vegetables All fresh, frozen, or canned  vegetables Fried or stir-fried vegetables Vegetables prepared with butter, cheese, or cream sauce  Fruit All fresh, frozen, or canned fruit Fried fruits Fruit served with butter or cream  Fats and Oils All vegetable oils   Butter, stick margarine, shortening, partially hydrogenated oils, tropical oils (coconut, palm, and palm kernel oils)   Pancreatitis Sample 1-Day Menu View Nutrient Info Breakfast 2 egg whites, cooked 1 whole wheat bagel 1 tablespoon low-fat cream cheese 1 cup fat-free milk  cup blueberries  Lunch 2 slices bread 2 ounces turkey breast 2 leaves lettuce 2 slices tomato 1 teaspoon mustard 2 teaspoons nonfat mayonnaise 1 cup carrots  cup pineapple 1 cup fat-free milk  Evening Meal 3 ounces tilapia, baked  cup cooked rice  cup zucchini 1 cup lettuce for salad 2 teaspoons fat-free Italian dressing, for salad 1 dinner roll 1 teaspoon margarine  Evening Snack  cup low-fat yogurt 1 cup strawberries 1 ounce pretzels  Daily Sum Nutrient Unit Value  Macronutrients  Energy kcal 1560  Energy kJ 6541  Protein g 103  Total lipid (fat) g 23  Carbohydrate, by difference g 241  Fiber, total dietary g 23  Sugars, total g 104  Minerals  Calcium, Ca mg 1159  Iron, Fe mg 12  Sodium, Na mg 2034  Vitamins  Vitamin C, total ascorbic acid mg 158  Vitamin A, IU IU 28445  Vitamin D IU 457  Lipids  Fatty acids, total saturated g 6  Fatty acids, total monounsaturated g 6  Fatty acids, total polyunsaturated g 8  Cholesterol mg 123      

## 2022-07-22 NOTE — Assessment & Plan Note (Signed)
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  GI consulted and recommended heme-onc evaluation who stated not true occlusion but due to inflammation of the vessel. 

## 2022-07-22 NOTE — Telephone Encounter (Signed)
Patient has been scheduled for a 43-month hospital follow up with Boone Master, PA-C on Monday, 09/23/22 at 11 am. Appt information mailed to patient.

## 2022-07-22 NOTE — Assessment & Plan Note (Signed)
VSS. Slow improvement in pain. Wbc downtrending. Off abx. - Appreciate GI (signed off), will f/u outpatient -Creon TID - Continue diet as tolerated, reconsider NG as needed at patient request - Percocet prn - Miralax BID for bowel regimen

## 2022-07-22 NOTE — Assessment & Plan Note (Addendum)
Hgb 7.2 this morning, has been stable between 7-9. Intermittently macrocytic likely in setting of alcohol use. - Continue to monitor - Iron studies

## 2022-07-22 NOTE — Assessment & Plan Note (Signed)
Noted on CT abdomen/pelvis. Not previously diagnosed. Low likelihood of SBP given that patient doesn't have white count or signs of bacterial infection.  Small volume on admission, will not pursue paracentesis at this time. -Outpatient f/u 

## 2022-07-22 NOTE — Assessment & Plan Note (Signed)
Patient's electrolytes remain a concern in the setting of GI loss and saponification with pancreatitis and decreased PO.  K and Mg repleted this morning. -AM CMP, Mg -Continue supplementation as necessary

## 2022-07-22 NOTE — Progress Notes (Signed)
Occupational Therapy Treatment Patient Details Name: Alexander [name] MRN: [id number] DOB: 1980-12-14 Today's Date: 07/22/2022   History of present illness Pt is a 42 y.o. M who presents 07/11/2022 with acute pancreatitis. 6/15 pt with worsening SOB due to fluid overload; IV Lasix given and pt placed on BiPAP. Significant PMH: alcohol abuse.   OT comments  Patient with good progress toward patient focused goals.  Patient lying in bed, able to perform dressing from sit to stand with Mod I, toileting task with Mod I due to continued unsteadiness, and decreased awareness of deficits and mild safety concerns.  ST eval is pending for formal cognitive assessment, and OT will look at pillbox test, as one of his PCA duties is to set up meds for his client.  Also unclear if driving will need to be assessed depending on ST findings.  OT will continue efforts in the acute setting, and AIR is being considered.  If not, home with initial supervision from mother and outpatient cognitive rehab if needed.      Recommendations for follow up therapy are one component of a multi-disciplinary discharge planning process, led by the attending physician.  Recommendations may be updated based on patient status, additional functional criteria and insurance authorization.    Assistance Recommended at Discharge Intermittent Supervision/Assistance  Patient can return home with the following  Direct supervision/assist for financial management   Equipment Recommendations       Recommendations for Other Services      Precautions / Restrictions Precautions Precautions: Fall Restrictions Weight Bearing Restrictions: No       Mobility Bed Mobility Overal bed mobility: Independent                  Transfers Overall transfer level: Modified independent Equipment used: None               General transfer comment: No physical assist, continues with mild unsteadiness     Balance Overall balance  assessment: Mild deficits observed, not formally tested                                         ADL either performed or assessed with clinical judgement   ADL       Grooming: Wash/dry hands;Modified independent;Standing           Upper Body Dressing : Independent;Sitting   Lower Body Dressing: Modified independent;Sit to/from stand   Toilet Transfer: Pharmacist, community;Ambulation                  Extremity/Trunk Assessment Upper Extremity Assessment Upper Extremity Assessment: Overall WFL for tasks assessed   Lower Extremity Assessment Lower Extremity Assessment: Defer to PT evaluation   Cervical / Trunk Assessment Cervical / Trunk Assessment: Normal    Vision Patient Visual Report: No change from baseline     Perception Perception Perception: Not tested   Praxis Praxis Praxis: Not tested    Cognition Arousal/Alertness: Awake/alert Behavior During Therapy: WFL for tasks assessed/performed Overall Cognitive Status: Impaired/Different from baseline                       Memory: Decreased short-term memory   Safety/Judgement: Decreased awareness of safety, Decreased awareness of deficits              Exercises      Shoulder Instructions  General Comments      Pertinent Vitals/ Pain       Pain Assessment Pain Score: 3  Pain Location: abdomen Pain Descriptors / Indicators: Discomfort Pain Intervention(s): Monitored during session                                                          Frequency  Min 2X/week        Progress Toward Goals  OT Goals(current goals can now be found in the care plan section)  Progress towards OT goals: Progressing toward goals  Acute Rehab OT Goals OT Goal Formulation: With patient Time For Goal Achievement: 07/29/22 Potential to Achieve Goals: Good  Plan Other (comment) (AIR versus home with mother.  ST Eval pending.)     Co-evaluation                 AM-PAC OT "6 Clicks" Daily Activity     Outcome Measure   Help from another person eating meals?: None Help from another person taking care of personal grooming?: None Help from another person toileting, which includes using toliet, bedpan, or urinal?: None Help from another person bathing (including washing, rinsing, drying)?: None Help from another person to put on and taking off regular upper body clothing?: None Help from another person to put on and taking off regular lower body clothing?: None 6 Click Score: 24    End of Session    OT Visit Diagnosis: Unsteadiness on feet (R26.81);Other symptoms and signs involving cognitive function   Activity Tolerance Patient tolerated treatment well   Patient Left in bed;with call bell/phone within reach   Nurse Communication Mobility status        Time: 1191-4782 OT Time Calculation (min): 22 min  Charges: OT General Charges $OT Visit: 1 Visit OT Treatments $Self Care/Home Management : 8-22 mins  07/22/2022  RP, OTR/L  Acute Rehabilitation Services  Office:  [phone]   Suzanna Obey 07/22/2022, 1:21 PM

## 2022-07-22 NOTE — Assessment & Plan Note (Addendum)
Stable - Continue amlodipine and losartan 25 mg

## 2022-07-22 NOTE — Progress Notes (Signed)
Calorie Count Note  48 hour calorie count ordered.  Diet: Regular Supplements:  Boost Breeze po TID, each supplement provides 250 kcal and 9 grams of protein [employer] 1.4 PO once daily, each supplement provides 455 kcal and 20 grams protein.  No meal tickets received from calorie count envelope. Pt provided dietary recall from the weekend. Per calorie count results, pt's po intake improving. Noted plans for likely discharge within 1-2 days. Pt continues to decline Cortrak placement for supplemental nutrition. Endorses ongoing abdominal pain but not worsened with food intake. Noted creon was started over the weekend. Continue to encourage optimal nutritional intake and ongoing use of nutrition supplements, in addition to smaller more frequent meal/snack.   Saturday 6/22 Breakfast: 430 kcal and 20g protein Lunch: 40 kcal and 4g protein Dinner: 173 kcal and 16g protein Supplements: 705 kcal and 29g protein  Total intake: 1348 kcal (64% of minimum estimated needs)  69 protein (77% of minimum estimated needs)  Sunday 6/23 Breakfast: 0 kcal/protein Lunch: 419 kcal and 46g protein Dinner: 210 kcal and 12g protein Supplements: 705 kcal and 29g protein  Total intake: 1334 kcal (64% of minimum estimated needs)  87 protein (97% of minimum estimated needs)  Nutrition Dx: Severe Malnutrition related to acute illness (pancreatitis) as evidenced by mild fat depletion, moderate muscle depletion, energy intake < or equal to 50% for > or equal to 5 days.    Goal: Patient will meet greater than or equal to 90% of their needs   Intervention:  Continue calorie count over the weekend; meal tickets to be collected 6/24 Boost Breeze po decreased to once daily each supplement provides 250 kcal and 9 grams of protein 30 ml ProSource Plus TID, each supplement provides 100 kcals and 15 grams protein.  [employer] 1.4 PO increase to BID, each supplement provides 455 kcal and 20 grams protein. Snacks  BID  Drusilla Kanner, RDN, LDN Clinical Nutrition

## 2022-07-22 NOTE — Telephone Encounter (Signed)
-----   Message from Imogene Burn, MD sent at 07/20/2022  5:16 PM EDT ----- [facility], please arrange for GI clinic follow up with Dr. Barron Alvine or APP in 2 months for pancreatitis and alcoholic hepatitis. Thanks.

## 2022-07-22 NOTE — Progress Notes (Signed)
Inpatient Rehab Admissions Coordinator:    I spoke with pt. Regarding CIR vs home d/c today. He feels his cognition is back to normal and would like to see how he does with therapy today before deciding if he wants to go home or do CIR first. States he can d/c home with his mom and family can take him to her home in [place]. I will follow up after he works with PT/OT. Slp eval also requested for further assessment of cognition. Cost estimate for CIR was provided.   Megan Salon, MS, CCC-SLP Rehab Admissions Coordinator  [phone] (celll) [phone] (office)

## 2022-07-22 NOTE — Assessment & Plan Note (Signed)
CIWA scores 0. S/p phenobarb taper. - CIWA protocol - Monitor BMPs - Cardiac telemetry - Thiamine 100 mg, Folic acid 1 mg,  MVI daily - gabapentin TID

## 2022-07-22 NOTE — Assessment & Plan Note (Signed)
AST/ALT within normal limits. - Continue prednisolone for alcoholic hepatitis - Will need 28 day course, then taper weekly per GI

## 2022-07-22 NOTE — Progress Notes (Signed)
Daily Progress Note Intern Pager: [phone]  Patient name: Alexander [name] Medical record number: [id number] Date of birth: Dec 19, 1980 Age: 42 y.o. Gender: male  Primary Care Provider: Patient, No Pcp Per Consultants: CCM, GI Code Status: Full   Pt Overview and Major Events to Date:  6/13: Admitted 6/15: Admitted to ICU, placed on precedex/BiPAP 6/19: back to FMTS on room air, phenobarb taper   Assessment and Plan: Alexander [name] is a 42 y.o. male admitted for acute pancreatitis in the setting of alcohol withdrawal and alcoholic hepatitis. 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   Hospital Problem List      Hospital     * (Principal) Acute pancreatitis     VSS. Slow improvement in pain. Wbc downtrending. Off abx. - Appreciate GI (signed off), will f/u outpatient -Creon TID - Continue diet as tolerated, reconsider NG as needed at patient request - Percocet prn - Miralax BID for bowel regimen        Alcohol withdrawal (HCC)     CIWA scores 0. S/p phenobarb taper. - CIWA protocol - Monitor BMPs - Cardiac telemetry - Thiamine 100 mg, Folic acid 1 mg,  MVI daily - gabapentin TID        Anemia     Hgb 7.2 this morning, has been stable between 7-9. Intermittently  macrocytic likely in setting of alcohol use. - Continue to monitor - Iron studies        Alcoholic hepatitis     AST/ALT within normal limits. - Continue prednisolone for alcoholic hepatitis - Will need 28 day course, then taper weekly per GI        Ascites     Noted on CT abdomen/pelvis. Not previously diagnosed. Low likelihood of  SBP given that patient doesn't have white count or signs of bacterial  infection.  Small volume on admission, will not pursue paracentesis at  this time. -Outpatient f/u        Electrolyte abnormality     Patient's electrolytes remain a concern in the setting of GI loss and  saponification with pancreatitis and decreased PO.  K and Mg  repleted this  morning. 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  GI consulted and recommended heme-onc evaluation  who stated not true occlusion but due to inflammation of the vessel.        Protein-calorie malnutrition, severe    FEN/GI: Regular diet as tolerated PPx: Lovenox Dispo: CIR recommended, pending eval. Will likely be stable for discharge within next 48hrs  Subjective:  NAOEN, still having abdominal pain but overall improved. Denies SOB/CP  Objective: Temp:  [98.4 F (36.9 C)-99.6 F (37.6 C)] 99.6 F (37.6 C) (06/24 0727) Pulse Rate:  [87-96] 95 (06/24 0727) Resp:  [16] 16 (06/24 0504) BP: (127-141)/(89-101) 133/92 (06/24 0727) SpO2:  [97 %-100 %] 98 % (06/24 0727) Physical Exam: General: NAD Cardiovascular: RRR Respiratory: CTAB normal WOB on RA Abdomen: Tight, mildly distended, tender to palpation diffusely but worse in epigastrium  Extremities: no significant edema  Laboratory: Most recent CBC Lab Results  Component Value Date   WBC 13.3 (H) 07/22/2022   HGB 7.2 (L) 07/22/2022   HCT  21.6 (L) 07/22/2022   MCV 101.9 (H) 07/22/2022   PLT 398 07/22/2022   Most recent BMP    Latest Ref Rng & Units 07/22/2022    4:46 AM  BMP  Glucose 70 - 99 mg/dL 696   BUN 6 - 20 mg/dL 5   Creatinine 2.95 - 2.84 mg/dL 1.32   Sodium 440 - 102 mmol/L 134   Potassium 3.5 - 5.1 mmol/L 3.1   Chloride 98 - 111 mmol/L 99   CO2 22 - 32 mmol/L 26   Calcium 8.9 - 10.3 mg/dL 8.4      Vonna Drafts, MD 07/22/2022, 11:56 AM  PGY-1, [facility] FPTS Intern pager: [phone], text pages welcome Secure chat group [facility] Teaching Service

## 2022-07-23 ENCOUNTER — Other Ambulatory Visit (HOSPITAL_COMMUNITY): Payer: Self-pay

## 2022-07-23 LAB — IRON AND TIBC
Iron: 16 ug/dL — ABNORMAL LOW (ref 45–182)
Saturation Ratios: 13 % — ABNORMAL LOW (ref 17.9–39.5)
TIBC: 126 ug/dL — ABNORMAL LOW (ref 250–450)
UIBC: 110 ug/dL

## 2022-07-23 LAB — CBC WITH DIFFERENTIAL/PLATELET
Abs Immature Granulocytes: 0.11 10*3/uL — ABNORMAL HIGH (ref 0.00–0.07)
Basophils Absolute: 0.1 10*3/uL (ref 0.0–0.1)
Basophils Relative: 0 %
Eosinophils Absolute: 0 10*3/uL (ref 0.0–0.5)
Eosinophils Relative: 0 %
HCT: 22 % — ABNORMAL LOW (ref 39.0–52.0)
Hemoglobin: 7.2 g/dL — ABNORMAL LOW (ref 13.0–17.0)
Immature Granulocytes: 1 %
Lymphocytes Relative: 8 %
Lymphs Abs: 1.3 10*3/uL (ref 0.7–4.0)
MCH: 33.2 pg (ref 26.0–34.0)
MCHC: 32.7 g/dL (ref 30.0–36.0)
MCV: 101.4 fL — ABNORMAL HIGH (ref 80.0–100.0)
Monocytes Absolute: 0.9 10*3/uL (ref 0.1–1.0)
Monocytes Relative: 6 %
Neutro Abs: 12.8 10*3/uL — ABNORMAL HIGH (ref 1.7–7.7)
Neutrophils Relative %: 85 %
Platelets: 503 10*3/uL — ABNORMAL HIGH (ref 150–400)
RBC: 2.17 MIL/uL — ABNORMAL LOW (ref 4.22–5.81)
RDW: 13.7 % (ref 11.5–15.5)
WBC: 15.2 10*3/uL — ABNORMAL HIGH (ref 4.0–10.5)
nRBC: 0.1 % (ref 0.0–0.2)

## 2022-07-23 LAB — COMPREHENSIVE METABOLIC PANEL
ALT: 18 U/L (ref 0–44)
AST: 24 U/L (ref 15–41)
Albumin: 1.9 g/dL — ABNORMAL LOW (ref 3.5–5.0)
Alkaline Phosphatase: 109 U/L (ref 38–126)
Anion gap: 8 (ref 5–15)
BUN: 5 mg/dL — ABNORMAL LOW (ref 6–20)
CO2: 23 mmol/L (ref 22–32)
Calcium: 8.6 mg/dL — ABNORMAL LOW (ref 8.9–10.3)
Chloride: 98 mmol/L (ref 98–111)
Creatinine, Ser: 0.56 mg/dL — ABNORMAL LOW (ref 0.61–1.24)
GFR, Estimated: >60 mL/min (ref >60)
Glucose, Bld: 111 mg/dL — ABNORMAL HIGH (ref 70–99)
Potassium: 4.3 mmol/L (ref 3.5–5.1)
Sodium: 129 mmol/L — ABNORMAL LOW (ref 135–145)
Total Bilirubin: 0.6 mg/dL (ref 0.3–1.2)
Total Protein: 5.7 g/dL — ABNORMAL LOW (ref 6.5–8.1)

## 2022-07-23 LAB — FERRITIN: Ferritin: 1373 ng/mL — ABNORMAL HIGH (ref 24–336)

## 2022-07-23 LAB — MAGNESIUM: Magnesium: 1.7 mg/dL (ref 1.7–2.4)

## 2022-07-23 MED ORDER — LOSARTAN POTASSIUM 25 MG PO TABS
25.0000 mg | ORAL_TABLET | Freq: Every day | ORAL | 1 refills | Status: DC
  Filled 2022-07-23: qty 30, 30d supply, fill #0

## 2022-07-23 MED ORDER — PREDNISOLONE 5 MG PO TABS: ORAL_TABLET | ORAL | 0 refills | Status: AC

## 2022-07-23 MED ORDER — SENNA 8.6 MG PO TABS
1.0000 | ORAL_TABLET | Freq: Every day | ORAL | 0 refills | Status: DC | PRN
  Filled 2022-07-23: qty 120, 120d supply, fill #0

## 2022-07-23 MED ORDER — POLYETHYLENE GLYCOL 3350 17 G PO PACK: 17.0000 g | PACK | Freq: Two times a day (BID) | ORAL | 0 refills | Status: DC

## 2022-07-23 MED ORDER — GABAPENTIN 100 MG PO CAPS: 100.0000 mg | ORAL_CAPSULE | Freq: Three times a day (TID) | ORAL | 0 refills | Status: DC

## 2022-07-23 MED ORDER — MAGNESIUM SULFATE 2 GM/50ML IV SOLN
2.0000 g | Freq: Once | INTRAVENOUS | Status: AC
  Administered 2022-07-23: 2 g via INTRAVENOUS
  Filled 2022-07-23: qty 50

## 2022-07-23 MED ORDER — SULFAMETHOXAZOLE-TRIMETHOPRIM 400-80 MG PO TABS
1.0000 | ORAL_TABLET | Freq: Every day | ORAL | 0 refills | Status: DC
  Filled 2022-07-23: qty 40, 40d supply, fill #0

## 2022-07-23 MED ORDER — SENNA 8.6 MG PO TABS: 1.0000 | ORAL_TABLET | Freq: Every day | ORAL | 0 refills | Status: DC | PRN

## 2022-07-23 MED ORDER — PREDNISOLONE 5 MG PO TABS
ORAL_TABLET | ORAL | 0 refills | Status: DC
  Filled 2022-07-23: qty 90, 11d supply, fill #0

## 2022-07-23 MED ORDER — PANTOPRAZOLE SODIUM 40 MG PO TBEC
40.0000 mg | DELAYED_RELEASE_TABLET | Freq: Every day | ORAL | 1 refills | Status: DC
  Filled 2022-07-23: qty 30, 30d supply, fill #0

## 2022-07-23 MED ORDER — FERROUS SULFATE 325 (65 FE) MG PO TBEC
325.0000 mg | DELAYED_RELEASE_TABLET | ORAL | 0 refills | Status: DC
  Filled 2022-07-23: qty 20, 40d supply, fill #0

## 2022-07-23 MED ORDER — OXYCODONE-ACETAMINOPHEN 5-325 MG PO TABS: 1.0000 | ORAL_TABLET | Freq: Four times a day (QID) | ORAL | 0 refills | Status: DC | PRN

## 2022-07-23 MED ORDER — PANCRELIPASE (LIP-PROT-AMYL) 36000-114000 UNITS PO CPEP
36000.0000 [IU] | ORAL_CAPSULE | Freq: Three times a day (TID) | ORAL | 0 refills | Status: DC
  Filled 2022-07-23: qty 180, 60d supply, fill #0

## 2022-07-23 MED ORDER — AMLODIPINE BESYLATE 5 MG PO TABS
5.0000 mg | ORAL_TABLET | Freq: Every day | ORAL | 1 refills | Status: DC
  Filled 2022-07-23: qty 30, 30d supply, fill #0

## 2022-07-23 MED ORDER — FERROUS SULFATE 325 (65 FE) MG PO TBEC
325.0000 mg | DELAYED_RELEASE_TABLET | ORAL | 0 refills | Status: DC
  Filled 2022-07-24: qty 20, 40d supply, fill #0

## 2022-07-23 MED ORDER — POLYETHYLENE GLYCOL 3350 17 G PO PACK
17.0000 g | PACK | Freq: Two times a day (BID) | ORAL | 0 refills | Status: DC
  Filled 2022-07-23: qty 14, 7d supply, fill #0

## 2022-07-23 MED ORDER — OXYCODONE-ACETAMINOPHEN 5-325 MG PO TABS
1.0000 | ORAL_TABLET | Freq: Four times a day (QID) | ORAL | 0 refills | Status: DC | PRN
  Filled 2022-07-23: qty 30, 4d supply, fill #0

## 2022-07-23 MED ORDER — PANCRELIPASE (LIP-PROT-AMYL) 36000-114000 UNITS PO CPEP
36000.0000 [IU] | ORAL_CAPSULE | Freq: Three times a day (TID) | ORAL | 0 refills | Status: DC
  Filled 2022-07-24: qty 100, 34d supply, fill #0
  Filled 2022-07-26: qty 21, 7d supply, fill #0
  Filled 2022-07-31: qty 79, 27d supply, fill #1
  Filled 2022-07-31: qty 21, 7d supply, fill #1
  Filled 2022-07-31: qty 79, 27d supply, fill #1

## 2022-07-23 MED ORDER — AMLODIPINE BESYLATE 5 MG PO TABS
5.0000 mg | ORAL_TABLET | Freq: Every day | ORAL | 0 refills | Status: DC
  Filled 2022-07-24: qty 30, 30d supply, fill #0

## 2022-07-23 MED ORDER — LOSARTAN POTASSIUM 25 MG PO TABS: 25.0000 mg | ORAL_TABLET | Freq: Every day | ORAL | 0 refills | Status: DC

## 2022-07-23 MED ORDER — SULFAMETHOXAZOLE-TRIMETHOPRIM 400-80 MG PO TABS
1.0000 | ORAL_TABLET | Freq: Every day | ORAL | 0 refills | Status: DC
  Filled 2022-07-24: qty 40, 40d supply, fill #0

## 2022-07-23 MED ORDER — GABAPENTIN 100 MG PO CAPS
100.0000 mg | ORAL_CAPSULE | Freq: Three times a day (TID) | ORAL | 0 refills | Status: DC
  Filled 2022-07-23: qty 90, 30d supply, fill #0

## 2022-07-23 MED ORDER — PANTOPRAZOLE SODIUM 40 MG PO TBEC
40.0000 mg | DELAYED_RELEASE_TABLET | Freq: Every day | ORAL | 0 refills | Status: DC
  Filled 2022-07-24: qty 30, 30d supply, fill #0

## 2022-07-23 NOTE — Assessment & Plan Note (Signed)
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  GI consulted and recommended heme-onc evaluation who stated not true occlusion but due to inflammation of the vessel. 

## 2022-07-23 NOTE — Progress Notes (Signed)
Physical Therapy Treatment Patient Details Name: Alexander [name] MRN: [id number] DOB: 1980/08/28 Today's Date: 07/23/2022   History of Present Illness Pt is a 42 y.o. M who presents 07/11/2022 with acute pancreatitis. 6/15 pt with worsening SOB due to fluid overload; IV Lasix given and pt placed on BiPAP. Significant PMH: alcohol abuse.    PT Comments    Progressing patient towards goals per plan of care. Today patient performed bed mobility, transfers and functional mobility without physical assistance. Pt still requires minguard when performing higher level balance task and dual-task during gait. Inconsistent performance with multi-step instructions and unable to recall months in reverse with gait. Pt currently able to perform all functional mobility but is unaware of current cognitive impairments. Pt endorsed returning home with a friend where he will live with his mother. Pt would benefit from continued cognitive rehab following discharge to improve cognitive impairments.     Recommendations for follow up therapy are one component of a multi-disciplinary discharge planning process, led by the attending physician.  Recommendations may be updated based on patient status, additional functional criteria and insurance authorization.  Follow Up Recommendations       Assistance Recommended at Discharge Frequent or constant Supervision/Assistance  Patient can return home with the following A little help with walking and/or transfers;A little help with bathing/dressing/bathroom;Assistance with cooking/housework;Direct supervision/assist for medications management;Direct supervision/assist for financial management;Assist for transportation;Help with stairs or ramp for entrance   Equipment Recommendations  Rolling walker (2 wheels);BSC/3in1    Recommendations for Other Services       Precautions / Restrictions Precautions Precautions: Fall Precaution Comments: Acute fall  6/16 Restrictions Weight Bearing Restrictions: No     Mobility  Bed Mobility Overal bed mobility: Independent             General bed mobility comments: Able to transition from supine to OOB without physical assistance    Transfers Overall transfer level: Modified independent                 General transfer comment: Without physical assistance; can transfer without UE support.    Ambulation/Gait Ambulation/Gait assistance: Supervision Gait Distance (Feet): 300 Feet Assistive device: 1 person hand held assist, None Gait Pattern/deviations: Step-through pattern, Decreased stride length, Drifts right/left Gait velocity: decreased Gait velocity interpretation: <1.8 ft/sec, indicate of risk for recurrent falls   General Gait Details: Presented with step through and continued left drift; a few instances of minor LOB performing larger step patterns requiring min guard but patient able to self recover. Dual task with ambulation still presents with slower processing and unaware of surroundings when asked to perform specifically turn or stop at a certain point. Unable to consistently recall months in reverse while walking.   Stairs Stairs: Yes Stairs assistance: Supervision Stair Management: Forwards, No rails, Step to pattern, One rail Right Number of Stairs: 10 General stair comments: Spontaneously ascended full flight of steps without physiceal assistance; descent with good control and use of right rail   Wheelchair Mobility    Modified Rankin (Stroke Patients Only)       Balance Overall balance assessment: Mild deficits observed, not formally tested Sitting-balance support: Feet supported Sitting balance-Leahy Scale: Fair Sitting balance - Comments: sitting EOB donning pants   Standing balance support: No upper extremity supported Standing balance-Leahy Scale: Fair Standing balance comment: Can statically stand without UE support in parallel stance  Cognition Arousal/Alertness: Awake/alert Behavior During Therapy: WFL for tasks assessed/performed Overall Cognitive Status: No family/caregiver present to determine baseline cognitive functioning Area of Impairment: Attention, Memory, Following commands, Safety/judgement, Awareness, Problem solving                   Current Attention Level: Focused Memory: Decreased short-term memory Following Commands: Follows one step commands with increased time, Follows multi-step commands with increased time, Follows multi-step commands inconsistently Safety/Judgement: Decreased awareness of safety, Decreased awareness of deficits Awareness: Intellectual Problem Solving: Decreased initiation, Requires verbal cues, Slow processing, Difficulty sequencing General Comments: Pt reports passing test with OT rehab and that his cognitive status is back to baseline; Inconsistent performance with multi-step instructions, provided verbal cues to redirect back to task.        Exercises      General Comments        Pertinent Vitals/Pain Pain Assessment Pain Assessment: 0-10 Pain Score: 7  Pain Location: Abdomen Pain Descriptors / Indicators: Discomfort, Grimacing, Guarding Pain Intervention(s): Monitored during session, Patient requesting pain meds-RN notified    Home Living     Available Help at Discharge: Family;Available PRN/intermittently Type of Home: House                  Prior Function            PT Goals (current goals can now be found in the care plan section) Acute Rehab PT Goals Patient Stated Goal: to walk more PT Goal Formulation: With patient Time For Goal Achievement: 07/28/22 Potential to Achieve Goals: Good Progress towards PT goals: Progressing toward goals    Frequency    Min 3X/week      PT Plan Current plan remains appropriate    Co-evaluation              AM-PAC PT "6 Clicks" Mobility   Outcome  Measure  Help needed turning from your back to your side while in a flat bed without using bedrails?: A Little Help needed moving from lying on your back to sitting on the side of a flat bed without using bedrails?: A Little Help needed moving to and from a bed to a chair (including a wheelchair)?: A Little Help needed standing up from a chair using your arms (e.g., wheelchair or bedside chair)?: A Little Help needed to walk in hospital room?: A Little Help needed climbing 3-5 steps with a railing? : A Lot 6 Click Score: 17    End of Session Equipment Utilized During Treatment: Gait belt Activity Tolerance: Patient tolerated treatment well Patient left: with call bell/phone within reach;in bed Nurse Communication: Mobility status;Patient requests pain meds PT Visit Diagnosis: Unsteadiness on feet (R26.81);Muscle weakness (generalized) (M62.81);Difficulty in walking, not elsewhere classified (R26.2)     Time: 1210-1226 PT Time Calculation (min) (ACUTE ONLY): 16 min  Charges:  $Gait Training: 8-22 mins                     Christene Lye, SPT Acute Rehabilitation Services [phone] Secure chat preferred     Christene Lye 07/23/2022, 1:29 PM

## 2022-07-23 NOTE — Assessment & Plan Note (Signed)
Stable - Continue amlodipine and losartan 25 mg 

## 2022-07-23 NOTE — Plan of Care (Signed)

## 2022-07-23 NOTE — Evaluation (Signed)
Speech Language Pathology Evaluation Patient Details Name: Alexander [name] MRN: [id number] DOB: 29-Apr-1980 Today's Date: 07/23/2022 Time: 1045-1100 SLP Time Calculation (min) (ACUTE ONLY): 15 min  Problem List:  Patient Active Problem List   Diagnosis Date Noted   Hepatic vein thrombosis (HCC) 07/18/2022   Protein-calorie malnutrition, severe 07/18/2022   Hypertension 07/17/2022   Acute pancreatitis 07/11/2022   Alcohol withdrawal (HCC) 07/11/2022   Anemia 07/11/2022   Prolonged QT interval 07/11/2022   Alcoholic hepatitis 07/11/2022   History of sexual behavior with high risk of exposure to communicable disease 07/11/2022   Ascites 07/11/2022   Alcohol use 07/11/2022   Electrolyte abnormality 07/11/2022   Past Medical History: History reviewed. No pertinent past medical history. Past Surgical History: History reviewed. No pertinent surgical history. HPI:  Pt is a 42 y.o. M who presents 07/11/2022 with acute pancreatitis. 6/15 pt with worsening SOB due to fluid overload; IV Lasix given and pt placed on BiPAP. Significant PMH: alcohol abuse.   Assessment / Plan / Recommendation Clinical Impression  Patient is presenting with moderately impaired cognition as per this evaluation and in addition, he does not exhibit any awareness to this. When SLP inquired about potential AIR, patient quickly stated, "I'm going home today". He was oriented x4. SLP administered the SLUMS examination, with patient receiving a score of 17 out of possible 30, placing him well below average range and into scoring category for 'Dementia' (scores 1-20). The SLUMS does not diagnose dementia, but indicates further cognitive testing is needed. Patient made errors in all categories except orientation questions, even missing shape identification (field of 3 choices). He did not exhibit awarness to his overall performance and when asked how he thought he did, telling SLP "I know I passed the majority of it". He did  demonstrate awareness to errors in clock drawing but he was not able to self correct, despite him making several attempts. Patient does not currently appear safe to be home without supervision and does not appear capable of performing more complex ADL tasks such as money management, medication management. He will benefit from skilled SLP services at next venue of care. (likely home in [place] with his mother)    SLP Assessment  SLP Recommendation/Assessment: All further Speech Lanaguage Pathology  needs can be addressed in the next venue of care SLP Visit Diagnosis: Cognitive communication deficit (R41.841)    Recommendations for follow up therapy are one component of a multi-disciplinary discharge planning process, led by the attending physician.  Recommendations may be updated based on patient status, additional functional criteria and insurance authorization.    Follow Up Recommendations  Other (comment) (SLP at next venue of care)    Assistance Recommended at Discharge  Intermittent Supervision/Assistance  Functional Status Assessment Patient has had a recent decline in their functional status and demonstrates the ability to make significant improvements in function in a reasonable and predictable amount of time.  Frequency and Duration           SLP Evaluation Cognition  Overall Cognitive Status: No family/caregiver present to determine baseline cognitive functioning Arousal/Alertness: Awake/alert Orientation Level: Oriented X4 Year: 2024 Month: June Day of Week: Correct Memory: Impaired Memory Impairment: Storage deficit;Retrieval deficit Awareness: Impaired Awareness Impairment: Intellectual impairment;Emergent impairment Problem Solving: Impaired Problem Solving Impairment: Functional basic;Functional complex Executive Function: Self Correcting Self Correcting: Impaired Self Correcting Impairment: Functional basic Safety/Judgment: Impaired Comments: When asked how he did  on SLUMS, patient said, "I know I passed the majority of it" despite receiving  score of 17 out of possible 30 with errors including incorrect shape identification       Comprehension  Auditory Comprehension Overall Auditory Comprehension: Appears within functional limits for tasks assessed    Expression Expression Primary Mode of Expression: Verbal Verbal Expression Overall Verbal Expression: Appears within functional limits for tasks assessed Initiation: No impairment Pragmatics: Impairment Impairments: Abnormal affect   Oral / Motor  Oral Motor/Sensory Function Overall Oral Motor/Sensory Function: Within functional limits           Angela Nevin, MA, CCC-SLP Speech Therapy

## 2022-07-23 NOTE — Assessment & Plan Note (Signed)
CIWA scores 0. S/p phenobarb taper. - CIWA protocol - Monitor BMPs - Cardiac telemetry - Thiamine 100 mg, Folic acid 1 mg,  MVI daily - gabapentin TID 

## 2022-07-23 NOTE — Assessment & Plan Note (Signed)
AST/ALT within normal limits. - Continue prednisolone for alcoholic hepatitis - Will need 28 day course, then taper weekly per GI 

## 2022-07-23 NOTE — Progress Notes (Signed)
Inpatient Rehab Admissions Coordinator:   Pt. Has progressed to the point that he is mod I for mobility and ADLs. He does not meet criteria for CIR at this time. Per SLP evaluation, he does demonstrate significant cognitive deficits. Pt. States he will go to his mom's house in [place] and there will be someone home with him.  CIR will sign off at this time.   Megan Salon, MS, CCC-SLP Rehab Admissions Coordinator  [phone] (celll) [phone] (office)

## 2022-07-23 NOTE — Assessment & Plan Note (Signed)
Appreciate RD

## 2022-07-23 NOTE — Progress Notes (Signed)
Occupational Therapy Treatment Patient Details Name: Alexander [name] MRN: [id number] DOB: March 27, 1980 Today's Date: 07/23/2022   History of present illness Pt is a 42 y.o. M who presents 07/11/2022 with acute pancreatitis. 6/15 pt with worsening SOB due to fluid overload; IV Lasix given and pt placed on BiPAP. Significant PMH: alcohol abuse.   OT comments  Pt continuing to progress towards pt focused goals, pillbox test administered to assess pt's functional cognition with daily PCA tasks. Pt successfully complete pillbox test without errors and demonstrate good interpretation of instructions and labels. No other cognitive deficits noted, pt denies any STM deficits. OT to continue to progress pt as able, DC plans remain appropriate.   Recommendations for follow up therapy are one component of a multi-disciplinary discharge planning process, led by the attending physician.  Recommendations may be updated based on patient status, additional functional criteria and insurance authorization.    Assistance Recommended at Discharge Intermittent Supervision/Assistance  Patient can return home with the following  Direct supervision/assist for financial management   Equipment Recommendations  None recommended by OT    Recommendations for Other Services      Precautions / Restrictions Precautions Precautions: Fall Precaution Comments: Acute fall 6/16 Restrictions Weight Bearing Restrictions: No       Mobility Bed Mobility Overal bed mobility: Independent                  Transfers                   General transfer comment: deferred     Balance Overall balance assessment: No apparent balance deficits (not formally assessed)                                         ADL either performed or assessed with clinical judgement   ADL                                         General ADL Comments: Pt assessed for cognition via pillbox test     Extremity/Trunk Assessment              Vision       Perception     Praxis      Cognition Arousal/Alertness: Awake/alert Behavior During Therapy: WFL for tasks assessed/performed Overall Cognitive Status: Within Functional Limits for tasks assessed                                 General Comments: Pt assessed for cognition via pillbox test, pt completed test without any errors, appropriately reading labels and interpreting them without difficulty        Exercises      Shoulder Instructions       General Comments      Pertinent Vitals/ Pain       Pain Assessment Pain Assessment: No/denies pain  Home Living     Available Help at Discharge: Family;Available PRN/intermittently Type of Home: House                                  Prior Functioning/Environment  Frequency  Min 2X/week        Progress Toward Goals  OT Goals(current goals can now be found in the care plan section)  Progress towards OT goals: Progressing toward goals  Acute Rehab OT Goals OT Goal Formulation: With patient Time For Goal Achievement: 07/29/22 Potential to Achieve Goals: Good  Plan Other (comment) (AIR versus home with mother. ST Eval pending.)    Co-evaluation                 AM-PAC OT "6 Clicks" Daily Activity     Outcome Measure   Help from another person eating meals?: None Help from another person taking care of personal grooming?: None Help from another person toileting, which includes using toliet, bedpan, or urinal?: None Help from another person bathing (including washing, rinsing, drying)?: None Help from another person to put on and taking off regular upper body clothing?: None Help from another person to put on and taking off regular lower body clothing?: None 6 Click Score: 24    End of Session    OT Visit Diagnosis: Unsteadiness on feet (R26.81);Other symptoms and signs involving cognitive  function   Activity Tolerance Patient tolerated treatment well   Patient Left in bed;with call bell/phone within reach   Nurse Communication Mobility status        Time: 0865-7846 OT Time Calculation (min): 15 min  Charges: OT General Charges $OT Visit: 1 Visit OT Treatments $Therapeutic Activity: 8-22 mins  07/23/2022  AB, OTR/L  Acute Rehabilitation Services  Office: [phone]   Alexander [name] 07/23/2022, 12:53 PM

## 2022-07-23 NOTE — Progress Notes (Signed)
Patient discharged, discharge instructions given and explained. Patient has no additional questions and or concerns at this time. Patient IV removed per protocol. [facility] pharmacy called for medication and they stated the system is down since 11am and unable to process meds. Patient and Doctors made aware, prescriptions sent to outside pharmacy but patient states he will come back tomorrow morning to pick up prescription due to financial program offered here at the hospital. Patient awaiting mother to arrive to discharge patient home safely.

## 2022-07-23 NOTE — Discharge Summary (Addendum)
Family Medicine Teaching [facility] Discharge Summary  Patient name: Alexander [name] Medical record number: [id number] Date of birth: [birth year] Age: 42 y.o. Gender: male Date of Admission: 07/11/2022  Date of Discharge: 07/23/22 Admitting Physician: Billey Co, MD  Primary Care Provider: Patient, No Pcp Per Consultants: CCM, GI  Indication for Hospitalization: acute pancreatitis  Discharge Diagnoses/Problem List:  Principal Problem for Admission: acute pancreatitis, alcohol withdrawal Other Problems addressed during stay:  Principal Problem:   Acute pancreatitis Active Problems:   Alcohol withdrawal (HCC)   Electrolyte abnormality   Anemia   Alcoholic hepatitis   Ascites   Hypertension   Hepatic vein thrombosis (HCC)   Protein-calorie malnutrition, severe    Brief Hospital Course:  Alexander [name] is a 42 y.o. male who was admitted to the [facility] Medicine Teaching Service at [facility] for acute pancreatitis. Hospital course is outlined below by problem.   Acute pancreatitis with alcoholic hepatitis Printed with multiple hours of nausea, vomiting, abdominal pain.  CTAP with acute pancreatitis.  Vitals with some hypotension, CCM was consulted who stated stable for floor.  Lipid panel without hypertriglyceridemia.  Did exhibit lactic acidosis that improved with fluids.  He was placed on aggressive fluid resuscitation.  Pain controlled with fentanyl.  Due to prolonged QTc on EKG, was treated with Ativan for nausea as below.  Diet was progressed as tolerated.  On 6/15, patient's oxygen requirements increased, likely due to volume overload from IV fluids (see below).  By return to FMTS, patient's abdominal pain persisted.  Repeat CT abdomen pelvis with necrotic pancreas, increased perihepatic fluid. GI saw who recommended fluids and continuing antibiotics (Augmentin which he was on for pneumonia>Zosyn given increase in WBCs, last dose 6/23). Discussion around placing cortrak were had  though patient was able to increase PO intake. He was transitioned to lidocaine patches and oral perocet for pain control.  By discharge, was tolerating PO and pain well controlled with percocet.   Acute hypoxemic respiratory failure CCM was called for increasing O2 requirements in setting of volume overload as above. He was placed on BiPAP and transferred to ICU.  He was able to come off of BiPAP and was ambulating through the halls by 6/18. CXR at that time with increased basilar predominant pulmonary opacities from prior with persistent bilateral pleural effusions. He was transferred back to FMTS on 6/19 on RA.  Alcoholic hepatitis and hepatic steatosis with ascites Evidence of severe hepatic steatosis with newly diagnosed ascites.  Reassured against SBP given no white count or signs of infection.  LFTs consistent with alcoholic hepatitis; he was placed on prednisolone taper (40mg  x28 days then decrease by 10mg  weekly until off). Hepatitis panel with hepatitis B core antibody IgM; hepatitis B surface antibody was consistent with immunity.  CTAP read on 6/19 with questionable hepatic vein thrombosis; this was read again by heme-onc who determined this was more due to an occlusion from inflammation versus thrombosis.  Due to steroid taper (>30 days of prednisone) pt sent home with bactrim for PJP ppx.  Alcohol withdrawal Significant alcohol use history.  Presented with tremors in the room.  Also had elevated liver enzymes.  He was placed on CIWA protocol with improvement in symptoms.  He was also given thiamine and folic acid.  Social work was consulted for substance use education.  On 6/15, along with fluid overload as above, patient CIWA increased to 39 and placed on Precedex in ICU.  He was able to wean off Precedex quickly and  be placed on phenobarbital taper with prn low-dose benzodiazepine.  He returned to FMTS on 6/19. He remained stable over admission though was felt to potentially have some  mental status changed related to alcohol use. At discharge, he was given gabapentin and instructed to follow up with new PCP.  Macrocytic anemia Hemoglobin 10.3 and MCV 101.3 on admission.  Most likely due to liver pathology.  Iron studies consistent with IDA. Started on iron supplementation.  Hypokalemia and hypomagnesemia Likely in the setting of GI losses versus decreased p.o. intake.  Magnesium 1.1, potassium 2.3.  They were repleted as needed, and at discharge Mg 1.7, K 4.3.  Hypertension Patient with DBP's in 100s over multiple checks on admission.  Most likely due to pain.  Started on amlodipine in ICU. BP remained high over admission, and losartan was added. By discharge, sent home with amlodipine 5mg  daily and losartan 25mg  daily.  Issues for follow up: Recheck CBC, CMP, magnesium Assess substance cessation Evaluate pain control Ascites follow-up Follow up BP control on losartan and amlodipine Ensure completion of steroid taper with bactrim PJP ppx  Disposition: home  Discharge Condition: stable, improved   Discharge Exam:  Vitals:   07/23/22 0751 07/23/22 0926  BP: 136/89 (!) 138/97  Pulse: 94 85  Resp:  16  Temp: 99.3 F (37.4 C) 98.6 F (37 C)  SpO2: 98% 98%   General: NAD Cardiovascular: RRR Respiratory: CTAB normal WOB on RA Abdomen: tight, mildly distended, tender diffusely Extremities: no significant edema  Significant Procedures: na  Significant Labs and Imaging:  Recent Labs  Lab 07/22/22 0446 07/23/22 0137  WBC 13.3* 15.2*  HGB 7.2* 7.2*  HCT 21.6* 22.0*  PLT 398 503*   Recent Labs  Lab 07/22/22 0446 07/23/22 0137  NA 134* 129*  K 3.1* 4.3  CL 99 98  CO2 26 23  GLUCOSE 111* 111*  BUN 5* 5*  CREATININE 0.58* 0.56*  CALCIUM 8.4* 8.6*  MG 1.5* 1.7  ALKPHOS 95 109  AST 19 24  ALT 21 18  ALBUMIN 1.8* 1.9*    Imaging:  CTA PE: IMPRESSION: 1. No findings to suggest acute aortic syndrome. 2. Evidence of acute pancreatitis, as  above. 3. Aortic atherosclerosis, in addition to left main and left anterior descending coronary artery disease. Please note that although the presence of coronary artery calcium documents the presence of coronary artery disease, the severity of this disease and any potential stenosis cannot be assessed on this non-gated CT examination. Assessment for potential risk factor modification, dietary therapy or pharmacologic therapy may be warranted, if clinically indicated. 4. Severe hepatic steatosis. 5. Small volume of ascites. 6. Moderate hiatal hernia. 7. Additional incidental findings, as above.  RUQ US IMPRESSION: Fatty liver.  No other focal abnormality is noted.  CT Head IMPRESSION: No acute intracranial pathology.  CXR 6/18 IMPRESSION: 1. Basilar predominant pulmonary opacities bilaterally, progressed from the prior examination of 07/13/2022. This is favored to reflect a combination of edema and atelectasis. Superimposed pneumonia cannot be excluded. 2. Blunting of the costophrenic angle, suggesting the presence of bilateral pleural effusions.  CT Abd/Pelv W contrast IMPRESSION: 1. Again seen are changes of severe, acute pancreatitis with necrosis involving the neck, body and proximal tail of pancreas. Progressive, mildly complex peripancreatic fluid is identified extending into bilateral retroperitoneum, small bowel mesentery, and lesser sac. No well-defined, peripherally enhancing fluid collection identified at this time to suggest mature pseudocyst or drainable abscess. 2. New small bilateral pleural effusions with overlying compressive type  atelectasis. Ground-glass and airspace densities noted within the anterior right lower lobe which may reflect atelectasis or pneumonia. 3. Mildly increased caliber small bowel loops with air-fluid levels identified which measure up to normal caliber of the colon with gas and stool noted up to the level of the rectum. Findings  are favored to represent ileus. 4. 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 5.  Aortic Atherosclerosis (ICD10-I70.0).  Echocardiogram IMPRESSIONS   1. Left ventricular ejection fraction, by estimation, is 55 to 60%. The  left ventricle has normal function. The left ventricle has no regional  wall motion abnormalities. There is mild concentric left ventricular  hypertrophy. Left ventricular diastolic  parameters are consistent with Grade I diastolic dysfunction (impaired  relaxation).   2. Right ventricular systolic function is normal. The right ventricular  size is normal. Tricuspid regurgitation signal is inadequate for assessing  PA pressure.   3. The mitral valve is normal in structure. No evidence of mitral valve  regurgitation. No evidence of mitral stenosis.   4. The aortic valve is normal in structure. Aortic valve regurgitation is  not visualized. No aortic stenosis is present.   5. Aortic dilatation noted. There is mild dilatation of the ascending  aorta, measuring 40 mm.   6. The inferior vena cava is normal in size with greater than 50%  respiratory variability, suggesting right atrial pressure of 3 mmHg.   Results/Tests Pending at Time of Discharge: na  Discharge Medications:  Allergies as of 07/23/2022   No Known Allergies      Medication List     TAKE these medications    acetaminophen 500 MG tablet Commonly known as: TYLENOL Take 1 tablet (500 mg total) by mouth every 6 (six) hours as needed. What changed: reasons to take this   amLODipine 5 MG tablet Commonly known as: NORVASC Take 1 tablet (5 mg total) by mouth daily.   Descovy 200-25 MG tablet Generic drug: emtricitabine-tenofovir AF Take 1 tablet by mouth daily.   ferrous sulfate 325 (65 FE) MG EC tablet Take 1 tablet (325 mg total) by mouth every other day.   gabapentin 100 MG capsule Commonly known as: NEURONTIN Take 1 capsule (100 mg total) by mouth 3 (three) times  daily.   lipase/protease/amylase 16109 UNITS Cpep capsule Commonly known as: CREON Take 1 capsule (36,000 Units total) by mouth 3 (three) times daily with meals.   losartan 25 MG tablet Commonly known as: COZAAR Take 1 tablet (25 mg total) by mouth daily.   oxyCODONE-acetaminophen 5-325 MG tablet Commonly known as: PERCOCET/ROXICET Take 1-2 tablets by mouth every 6 (six) hours as needed for severe pain or moderate pain.   pantoprazole 40 MG tablet Commonly known as: PROTONIX Take 1 tablet (40 mg total) by mouth at bedtime.   polyethylene glycol 17 g packet Commonly known as: MIRALAX / GLYCOLAX Take 17 g by mouth 2 (two) times daily.   prednisoLONE 5 MG Tabs tablet Take 8 tablets (40 mg total) by mouth daily for 19 days, THEN 6 tablets (30 mg total) daily for 7 days, THEN 4 tablets (20 mg total) daily for 7 days, THEN 2 tablets (10 mg total) daily for 7 days. Start taking on: July 23, 2022   senna 8.6 MG Tabs tablet Commonly known as: SENOKOT Take 1 tablet (8.6 mg total) by mouth daily as needed for mild constipation.   sulfamethoxazole-trimethoprim 400-80 MG tablet Commonly known as: BACTRIM Take 1 tablet by mouth daily.  Discharge Instructions: Please refer to Patient Instructions section of EMR for full details.  Patient was counseled important signs and symptoms that should prompt return to medical care, changes in medications, dietary instructions, activity restrictions, and follow up appointments.   Follow-Up Appointments:  Follow-up Information     Masters, Florentina Addison, DO Follow up.   Specialty: Internal Medicine Why: TIME : 10:00 AM DATE : JULY 09 , 2024 LOCATION: [facility]  INTERNAL MEDICINE , ENTRANCE -A , GROUND FLOOR [address] [place] [address] [employer] $25 Contact information: [address] [place] [address] [phone]         Legrand Como, PA-C Follow up on 09/23/2022.   Specialty: Gastroenterology Why: F/u  appointment w/ Boone Master, PA-C on Monday, 09/23/22 at 11 am Contact information: [address] [place] [address] [phone]                 Vonna Drafts, MD 07/23/2022, 11:06 AM PGY-1, [facility]

## 2022-07-23 NOTE — Assessment & Plan Note (Signed)
VSS. Slow improvement in pain. Wbc downtrending. Off abx. - Appreciate GI (signed off), will f/u outpatient -Creon TID - Continue diet as tolerated, reconsider NG as needed at patient request - Percocet prn - Miralax BID for bowel regimen 

## 2022-07-23 NOTE — Progress Notes (Signed)
Patient declines discharged lounge, states he doesn't feel well and he was told that he can stay in his room until ready this morning.

## 2022-07-23 NOTE — Assessment & Plan Note (Signed)
Hgb 7.2 this morning, has been stable between 7-9. Intermittently macrocytic likely in setting of alcohol use. Low iron/sat suggestive of possible IDA. - Continue to monitor - Iron studies

## 2022-07-23 NOTE — Assessment & Plan Note (Signed)
Noted on CT abdomen/pelvis. Not previously diagnosed. Low likelihood of SBP given that patient doesn't have white count or signs of bacterial infection.  Small volume on admission, will not pursue paracentesis at this time. -Outpatient f/u 

## 2022-07-23 NOTE — Assessment & Plan Note (Addendum)
Patient's electrolytes remain a concern in the setting of GI loss and saponification with pancreatitis and decreased PO.  Mg repleted this morning. -AM CMP, Mg -Continue supplementation as necessary

## 2022-07-24 ENCOUNTER — Other Ambulatory Visit (HOSPITAL_COMMUNITY): Payer: Self-pay

## 2022-07-24 ENCOUNTER — Other Ambulatory Visit: Payer: Self-pay

## 2022-07-24 ENCOUNTER — Ambulatory Visit: Payer: Self-pay | Admitting: Infectious Disease

## 2022-07-24 MED ORDER — IBUPROFEN 600 MG PO TABS: 600.0000 mg | ORAL_TABLET | Freq: Every day | ORAL | 0 refills | Status: DC

## 2022-07-24 MED ORDER — ACETAMINOPHEN EXTRA STRENGTH 500 MG PO TABS: 500.0000 mg | ORAL_TABLET | Freq: Four times a day (QID) | ORAL | 0 refills | Status: DC | PRN

## 2022-07-24 MED ORDER — PREDNISONE 5 MG PO TABS
ORAL_TABLET | ORAL | 0 refills | Status: AC
  Filled 2022-07-24 – 2022-07-25 (×3): qty 236, 40d supply, fill #0

## 2022-07-24 MED ORDER — EMTRICITABINE-TENOFOVIR AF 200-25 MG PO TABS: 1.0000 | ORAL_TABLET | Freq: Every day | ORAL | 2 refills | Status: DC

## 2022-07-24 MED ORDER — IBUPROFEN 600 MG PO TABS
600.0000 mg | ORAL_TABLET | Freq: Three times a day (TID) | ORAL | 0 refills | Status: DC
  Filled 2022-07-24: qty 21, 7d supply, fill #0

## 2022-07-25 ENCOUNTER — Telehealth: Payer: Self-pay

## 2022-07-25 ENCOUNTER — Other Ambulatory Visit (HOSPITAL_COMMUNITY): Payer: Self-pay

## 2022-07-25 ENCOUNTER — Telehealth: Payer: Self-pay | Admitting: Student

## 2022-07-25 MED ORDER — OXYCODONE-ACETAMINOPHEN 5-325 MG PO TABS
1.0000 | ORAL_TABLET | Freq: Four times a day (QID) | ORAL | 0 refills | Status: DC | PRN
  Filled 2022-07-25 (×2): qty 30, 4d supply, fill #0

## 2022-07-25 MED ORDER — GABAPENTIN 100 MG PO CAPS
100.0000 mg | ORAL_CAPSULE | Freq: Three times a day (TID) | ORAL | 0 refills | Status: DC
  Filled 2022-07-25 (×2): qty 90, 30d supply, fill #0

## 2022-07-25 NOTE — Telephone Encounter (Signed)
Called and discussed with patient. Sent meds to pharmacy after verifying with pharmacist.

## 2022-07-25 NOTE — Telephone Encounter (Signed)
Patient calls nurse line in regards to Gabapentin and Oxycodone prescriptions.   He reports he was recently discharged from the hospital under our service and medications were sent to [facility]. He reports the "system was down" and medications could not be sent to [facility].  He reports he was able to have [facility] transfer all prescriptions to [facility] except for the (2) controlled substances.   Will forward to prescribing provider.

## 2022-07-25 NOTE — Telephone Encounter (Signed)
Discussed with patient difficulty obtaining meds due to pharmacy system being down at time of discharge. I have confirmed with pharmacy that he has not picked up gabapentin nor oxycodone yet and may need to be prescribed again. I will re-prescribe these.

## 2022-07-26 ENCOUNTER — Other Ambulatory Visit (HOSPITAL_COMMUNITY): Payer: Self-pay

## 2022-07-26 ENCOUNTER — Other Ambulatory Visit: Payer: Self-pay

## 2022-07-29 ENCOUNTER — Other Ambulatory Visit (HOSPITAL_COMMUNITY): Payer: Self-pay

## 2022-07-31 ENCOUNTER — Other Ambulatory Visit: Payer: Self-pay

## 2022-07-31 ENCOUNTER — Other Ambulatory Visit (HOSPITAL_COMMUNITY): Payer: Self-pay

## 2022-08-06 ENCOUNTER — Ambulatory Visit: Payer: Medicaid Other | Admitting: Internal Medicine

## 2022-08-06 ENCOUNTER — Encounter: Payer: Self-pay | Admitting: Internal Medicine

## 2022-08-06 ENCOUNTER — Other Ambulatory Visit (HOSPITAL_COMMUNITY): Payer: Self-pay

## 2022-08-06 VITALS — BP 121/89 | HR 88 | Temp 98.4°F | Ht 67.0 in | Wt 141.4 lb

## 2022-08-06 DIAGNOSIS — I10 Essential (primary) hypertension: Secondary | ICD-10-CM | POA: Diagnosis present

## 2022-08-06 DIAGNOSIS — G629 Polyneuropathy, unspecified: Secondary | ICD-10-CM | POA: Diagnosis not present

## 2022-08-06 DIAGNOSIS — K5900 Constipation, unspecified: Secondary | ICD-10-CM

## 2022-08-06 DIAGNOSIS — D649 Anemia, unspecified: Secondary | ICD-10-CM | POA: Diagnosis not present

## 2022-08-06 DIAGNOSIS — Z8719 Personal history of other diseases of the digestive system: Secondary | ICD-10-CM | POA: Diagnosis not present

## 2022-08-06 DIAGNOSIS — K7010 Alcoholic hepatitis without ascites: Secondary | ICD-10-CM

## 2022-08-06 DIAGNOSIS — D509 Iron deficiency anemia, unspecified: Secondary | ICD-10-CM

## 2022-08-06 MED ORDER — SENNA 8.6 MG PO TABS
1.0000 | ORAL_TABLET | Freq: Every day | ORAL | 0 refills | Status: DC | PRN
Start: 2022-08-06 — End: 2023-02-10
  Filled 2022-08-06: qty 100, 100d supply, fill #0
  Filled 2022-08-06: qty 30, 30d supply, fill #0

## 2022-08-06 MED ORDER — POLYETHYLENE GLYCOL 3350 17 G PO PACK
17.0000 g | PACK | Freq: Every day | ORAL | 0 refills | Status: DC | PRN
Start: 2022-08-06 — End: 2023-08-30
  Filled 2022-08-06: qty 14, 14d supply, fill #0

## 2022-08-06 MED ORDER — SULFAMETHOXAZOLE-TRIMETHOPRIM 400-80 MG PO TABS
1.0000 | ORAL_TABLET | Freq: Every day | ORAL | 0 refills | Status: DC
Start: 2022-08-06 — End: 2022-08-08
  Filled 2022-08-06: qty 40, 40d supply, fill #0

## 2022-08-06 MED ORDER — GABAPENTIN 300 MG PO CAPS
300.0000 mg | ORAL_CAPSULE | Freq: Three times a day (TID) | ORAL | 2 refills | Status: DC
Start: 2022-08-06 — End: 2022-11-08
  Filled 2022-08-06: qty 90, 30d supply, fill #0
  Filled 2022-09-02: qty 90, 30d supply, fill #1
  Filled 2022-10-10: qty 90, 30d supply, fill #2

## 2022-08-06 NOTE — Progress Notes (Unsigned)
Subjective:  CC: hospital follow-up  HPI:  [name] is a 42 y.o. male with a past medical history stated below and presents today to establish care. He was admitted for alcoholic induced pancreatitis and hepatitis from 06/13 to 06/25.  Hospital course was complicated by ICU admission due to hypoxic respiratory failure but he improved with BiPAP. Please see problem based assessment and plan for additional details.  Past Medical History:  Diagnosis Date   Alcohol use disorder    Alcoholic hepatitis    Anemia    Pancreatitis     Current Outpatient Medications on File Prior to Visit  Medication Sig Dispense Refill   acetaminophen (TYLENOL) 500 MG tablet Take 1 tablet (500 mg total) by mouth every 6 (six) hours as needed. (Patient taking differently: Take 500 mg by mouth every 6 (six) hours as needed for mild pain.) 30 tablet 0   amLODipine (NORVASC) 5 MG tablet Take 1 tablet (5 mg total) by mouth daily. 30 tablet 0   emtricitabine-tenofovir AF (DESCOVY) 200-25 MG tablet Take 1 tablet by mouth daily. 30 tablet 2   ferrous sulfate 325 (65 FE) MG EC tablet Take 1 tablet (325 mg total) by mouth every other day. 20 tablet 0   lipase/protease/amylase (CREON) 36000 UNITS CPEP capsule Take 1 capsule (36,000 Units total) by mouth 3 (three) times daily with meals. 100 capsule 0   pantoprazole (PROTONIX) 40 MG tablet Take 1 tablet (40 mg total) by mouth at bedtime. 30 tablet 0   prednisoLONE 5 MG TABS tablet Take 8 tablets (40 mg total) by mouth daily for 19 days, THEN 6 tablets (30 mg total) daily for 7 days, THEN 4 tablets (20 mg total) daily for 7 days, THEN 2 tablets (10 mg total) daily for 7 days. 236 tablet 0   predniSONE (DELTASONE) 5 MG tablet Take 8 tablets daily for 19 days, THEN 6 tablets daily for 7 days, THEN 4 tablets daily for 7 days, THEN 2 tablets daily for 7 days. 236 tablet 0   No current facility-administered medications on file prior to visit.    Family History   Problem Relation Age of Onset   Healthy Mother    Hypertension Father     Social History   Socioeconomic History   Marital status: Single    Spouse name: Not on file   Number of children: Not on file   Years of education: Not on file   Highest education level: Not on file  Occupational History   Not on file  Tobacco Use   Smoking status: Never   Smokeless tobacco: Never  Substance and Sexual Activity   Alcohol use: Yes   Drug use: Never   Sexual activity: Yes    Partners: Male    Comment: follows with health department for descovy  Other Topics Concern   Not on file  Social History Narrative   Not on file   Social Determinants of Health   Financial Resource Strain: Not on file  Food Insecurity: No Food Insecurity (07/16/2022)   Hunger Vital Sign    Worried About Running Out of Food in the Last Year: Never true    Ran Out of Food in the Last Year: Never true  Transportation Needs: No Transportation Needs (07/16/2022)   PRAPARE - Administrator, Civil Service (Medical): No    Lack of Transportation (Non-Medical): No  Physical Activity: Not on file  Stress: Not on file  Social Connections:  Not on file  Intimate Partner Violence: Not At Risk (07/16/2022)   Humiliation, Afraid, Rape, and Kick questionnaire    Fear of Current or Ex-Partner: No    Emotionally Abused: No    Physically Abused: No    Sexually Abused: No    Review of Systems: ROS negative except for what is noted on the assessment and plan.  Objective:   Vitals:   08/06/22 1021  BP: 121/89  Pulse: 88  Temp: 98.4 F (36.9 C)  TempSrc: Oral  Weight: 141 lb 6.4 oz (64.1 kg)  Height: 5\' 7"  (1.702 m)    Physical Exam: Constitutional: well-appearing  Cardiovascular: regular rate and rhythm, no m/r/g Pulmonary/Chest: normal work of breathing on room air, lungs clear to auscultation bilaterally Abdominal: soft, mild tenderness to lower quadrant, non-distended MSK: normal bulk and  tone Skin: warm and dry   Assessment & Plan:  History of acute pancreatitis He was admitted with alcohol induced pancreatitis. Lipase was elevated at 1700 and CT showed necrosis involving neck, body and tail of pancreas. He was drinking 1/4 gallon of liquor daily for several years. Hospital course complicated by fluid overload and he developed acute hypoxic respiratory failure leading to short ICU stay with Bipap. Since discharge he has not drank alcohol. He continues to have mild pain in abdomen with eating but this is improving. He is taking creon TID with meals and notes that it helps with his appetite. He has follow-up with GI 08/26 with [facility]. -CTM -encouraged continued alcohol cessation, cravings are well controlled with gabapentin  History of alcoholic hepatitis Transaminitis present and maddrey score in teens so started on steroids. Lille score showed good response to steroids. He was continued with prednisone for 28 day course with instructions to taper by 10 mg weekly there after. He was send home with bactrim for PJP prophylaxis however he did not pick up this medication from pharmacy. P: Continue steroid taper Re sent bactrim and educated patient on why he should be taking bactrim. He will take this medication until steroid has been tapered to 10 mg.  F/u GI in August.  Hypertension He was discharged with both amlodipine and losartan but has only been taking amlodipine.  His blood pressure is well controlled at 121/89. -continue amlodipine -losartan removed from medication list  Anemia Patient with anemia with macrocytosis. B12 was elevated in admission and he reports taking B12 supplementation. He was found to be iron deficient with iron at 16 and sat 13%. He will need colonoscopy and has follow-up with GI at end of August. Likely that he has bone marrow suppression in setting of alcohol intake. Hopefully anemia will improve with iron supplementation and alcohol  cessation.  He denies current melena or hematochezia but has seen both in stool in the past. -hgb improved to 9 on cbc  -folate -continue iron supplement, repeat iron at follow-up -GI referral placed for iron deficiency anemia as follow-up is just for hepatitis   Polyneuropathy He reports bilateral lower extremity numbness and tingling for the last several months. Likely in setting of alcohol use. B12 was elevated. He was started on gabapentin for alcohol cessation as liver enzymes elevated and he could not take naltrexone. He has not noted improvement with gabapentin. On exam, altered sensation present in L4, L5, and S1 dermatomes. P: increase gabapentin to 300 mg TID  Constipation Ileus noted on CT. He has BM every few days and has been taking colace without improvement in symptoms.  -miralax prn -  senna prn  Patient discussed with Dr. Dionne Bucy Aqib Lough, D.O. [facility]  PGY-3 Pager: [phone]  Phone: [phone] Date 08/07/2022  Time 5:18 PM

## 2022-08-06 NOTE — Patient Instructions (Signed)
Thank you, Alexander [name] for allowing [place] to provide your care today.   I ordered a few labs to follow-up on your liver and blood count. I will call with results. Please continue with predisone. It is important that you pick up bactrim and take this medication until you have tapered down to predisone 10 mg. You have follow-up with GI in 8/24.  I think that the neuropathy is from alcohol use. I am increasing gabapentin to see if this helps with discomfort.  For constipation, please start taking miralax and senna daily as needed.  Once I look at liver enzymes again we can talk about restarting descovy to prevent HIV.  Please continue taking amlodipine for your blood pressure.  I have ordered the following labs for you:  Lab Orders         CMP14 + Anion Gap         CBC no Diff         Folate       I have ordered the following medication/changed the following medications:   Stop the following medications: Medications Discontinued During This Encounter  Medication Reason   oxyCODONE-acetaminophen (PERCOCET/ROXICET) 5-325 MG tablet    DESCOVY 200-25 MG tablet    losartan (COZAAR) 25 MG tablet    ibuprofen (ADVIL) 600 MG tablet    ibuprofen (ADVIL) 600 MG tablet    Acetaminophen Extra Strength 500 MG TABS    polyethylene glycol (MIRALAX / GLYCOLAX) 17 g packet Reorder   senna (SENOKOT) 8.6 MG TABS tablet Reorder   sulfamethoxazole-trimethoprim (BACTRIM) 400-80 MG tablet Reorder   gabapentin (NEURONTIN) 100 MG capsule      Start the following medications: Meds ordered this encounter  Medications   senna (SENOKOT) 8.6 MG TABS tablet    Sig: Take 1 tablet (8.6 mg total) by mouth daily as needed for mild constipation.    Dispense:  30 tablet    Refill:  0   polyethylene glycol (MIRALAX / GLYCOLAX) 17 g packet    Sig: Take 17 g by mouth daily as needed.    Dispense:  14 each    Refill:  0   sulfamethoxazole-trimethoprim (BACTRIM) 400-80 MG tablet    Sig: Take 1 tablet by mouth  daily.    Dispense:  40 tablet    Refill:  0   gabapentin (NEURONTIN) 300 MG capsule    Sig: Take 1 capsule (300 mg total) by mouth 3 (three) times daily.    Dispense:  90 capsule    Refill:  2     Follow up:  4 weeks    We look forward to seeing you next time. Please call our clinic at [phone] if you have any questions or concerns. The best time to call is Monday-Friday from 9am-4pm, but there is someone available 24/7. If after hours or the weekend, call the main hospital number and ask for the Internal Medicine Resident On-Call. If you need medication refills, please notify your pharmacy one week in advance and they will send [place] a request.   Thank you for trusting me with your care. Wishing you the best!   Rudene Christians, DO [facility]

## 2022-08-07 ENCOUNTER — Encounter: Payer: Self-pay | Admitting: Internal Medicine

## 2022-08-07 DIAGNOSIS — K5900 Constipation, unspecified: Secondary | ICD-10-CM | POA: Insufficient documentation

## 2022-08-07 DIAGNOSIS — G629 Polyneuropathy, unspecified: Secondary | ICD-10-CM | POA: Insufficient documentation

## 2022-08-07 LAB — CMP14 + ANION GAP
AST: 16 IU/L (ref 0–40)
Albumin: 3.7 g/dL — ABNORMAL LOW (ref 4.1–5.1)
Alkaline Phosphatase: 90 IU/L (ref 44–121)
BUN/Creatinine Ratio: 14 (ref 9–20)
BUN: 8 mg/dL (ref 6–24)
Calcium: 10 mg/dL (ref 8.7–10.2)
Creatinine, Ser: 0.56 mg/dL — ABNORMAL LOW (ref 0.76–1.27)
Globulin, Total: 2.9 g/dL (ref 1.5–4.5)
Glucose: 79 mg/dL (ref 70–99)

## 2022-08-07 LAB — CBC
Hemoglobin: 9 g/dL — ABNORMAL LOW (ref 13.0–17.7)
MCHC: 33.1 g/dL (ref 31.5–35.7)
MCV: 99 fL — ABNORMAL HIGH (ref 79–97)
Platelets: 603 10*3/uL — ABNORMAL HIGH (ref 150–450)
WBC: 8.8 10*3/uL (ref 3.4–10.8)

## 2022-08-07 LAB — FOLATE

## 2022-08-07 NOTE — Assessment & Plan Note (Signed)
Ileus noted on CT. He has BM every few days and has been taking colace without improvement in symptoms.  -miralax prn -senna prn

## 2022-08-07 NOTE — Assessment & Plan Note (Signed)
He was admitted with alcohol induced pancreatitis. Lipase was elevated at 1700 and CT showed necrosis involving neck, body and tail of pancreas. He was drinking 1/4 gallon of liquor daily for several years. Hospital course complicated by fluid overload and he developed acute hypoxic respiratory failure leading to short ICU stay with Bipap. Since discharge he has not drank alcohol. He continues to have mild pain in abdomen with eating but this is improving. He is taking creon TID with meals and notes that it helps with his appetite. He has follow-up with GI 08/26 with [facility]. -CTM -encouraged continued alcohol cessation, cravings are well controlled with gabapentin

## 2022-08-07 NOTE — Assessment & Plan Note (Signed)
Patient with anemia with macrocytosis. B12 was elevated in admission and he reports taking B12 supplementation. He was found to be iron deficient with iron at 16 and sat 13%. He will need colonoscopy and has follow-up with GI at end of August. Likely that he has bone marrow suppression in setting of alcohol intake. Hopefully anemia will improve with iron supplementation and alcohol cessation.  He denies current melena or hematochezia but has seen both in stool in the past. -hgb improved to 9 on cbc  -folate -continue iron supplement, repeat iron at follow-up -GI referral placed for iron deficiency anemia as follow-up is just for hepatitis

## 2022-08-07 NOTE — Assessment & Plan Note (Signed)
Transaminitis present and maddrey score in teens so started on steroids. Lille score showed good response to steroids. He was continued with prednisone for 28 day course with instructions to taper by 10 mg weekly there after. He was send home with bactrim for PJP prophylaxis however he did not pick up this medication from pharmacy. P: Continue steroid taper Re sent bactrim and educated patient on why he should be taking bactrim. He will take this medication until steroid has been tapered to 10 mg.  F/u GI in August.  Addendum Fib 4 score 0.25

## 2022-08-07 NOTE — Assessment & Plan Note (Signed)
He was discharged with both amlodipine and losartan but has only been taking amlodipine.  His blood pressure is well controlled at 121/89. -continue amlodipine -losartan removed from medication list

## 2022-08-07 NOTE — Assessment & Plan Note (Signed)
He reports bilateral lower extremity numbness and tingling for the last several months. Likely in setting of alcohol use. B12 was elevated. He was started on gabapentin for alcohol cessation as liver enzymes elevated and he could not take naltrexone. He has not noted improvement with gabapentin. On exam, altered sensation present in L4, L5, and S1 dermatomes. P: increase gabapentin to 300 mg TID

## 2022-08-08 ENCOUNTER — Other Ambulatory Visit: Payer: Self-pay

## 2022-08-08 LAB — CBC
Hematocrit: 27.2 % — ABNORMAL LOW (ref 37.5–51.0)
MCH: 32.8 pg (ref 26.6–33.0)
RBC: 2.74 x10E6/uL — CL (ref 4.14–5.80)
RDW: 13 % (ref 11.6–15.4)

## 2022-08-08 LAB — CMP14 + ANION GAP
ALT: 21 IU/L (ref 0–44)
Anion Gap: 16 mmol/L (ref 10.0–18.0)
Bilirubin Total: 0.2 mg/dL (ref 0.0–1.2)
CO2: 24 mmol/L (ref 20–29)
Chloride: 103 mmol/L (ref 96–106)
Potassium: 4.3 mmol/L (ref 3.5–5.2)
Sodium: 143 mmol/L (ref 134–144)
Total Protein: 6.6 g/dL (ref 6.0–8.5)
eGFR: 125 mL/min/{1.73_m2} (ref >59)

## 2022-08-08 NOTE — Addendum Note (Signed)
Addended by: Lucille Passy on: 08/08/2022 06:26 PM   Modules accepted: Orders

## 2022-08-09 ENCOUNTER — Other Ambulatory Visit: Payer: Self-pay

## 2022-08-09 NOTE — Progress Notes (Signed)
Internal Medicine Clinic Attending  Case discussed with the resident at the time of the visit.  We reviewed the resident's history and exam and pertinent patient test results.  I agree with the assessment, diagnosis, and plan of care documented in the resident's note.  

## 2022-08-12 ENCOUNTER — Other Ambulatory Visit (HOSPITAL_COMMUNITY): Payer: Self-pay

## 2022-08-23 ENCOUNTER — Other Ambulatory Visit (HOSPITAL_COMMUNITY): Payer: Self-pay

## 2022-08-23 ENCOUNTER — Other Ambulatory Visit: Payer: Self-pay | Admitting: Student

## 2022-08-23 ENCOUNTER — Other Ambulatory Visit: Payer: Self-pay

## 2022-08-23 MED ORDER — AMLODIPINE BESYLATE 5 MG PO TABS
5.0000 mg | ORAL_TABLET | Freq: Every day | ORAL | 11 refills | Status: DC
  Filled 2022-08-23: qty 90, 90d supply, fill #0
  Filled 2022-11-19: qty 90, 90d supply, fill #1
  Filled 2023-02-10: qty 90, 90d supply, fill #2
  Filled 2023-05-31: qty 90, 90d supply, fill #3
  Filled 2023-07-01 – 2023-07-31 (×2): qty 90, 90d supply, fill #4

## 2022-08-26 ENCOUNTER — Other Ambulatory Visit (HOSPITAL_COMMUNITY): Payer: Self-pay

## 2022-08-28 ENCOUNTER — Other Ambulatory Visit: Payer: Self-pay | Admitting: Student

## 2022-08-28 ENCOUNTER — Other Ambulatory Visit (HOSPITAL_COMMUNITY): Payer: Self-pay

## 2022-08-29 ENCOUNTER — Other Ambulatory Visit (HOSPITAL_COMMUNITY): Payer: Self-pay

## 2022-08-29 MED ORDER — SULFAMETHOXAZOLE-TRIMETHOPRIM 400-80 MG PO TABS
1.0000 | ORAL_TABLET | Freq: Every day | ORAL | 0 refills | Status: DC
  Filled 2022-08-29: qty 4, 4d supply, fill #0

## 2022-09-02 ENCOUNTER — Emergency Department (HOSPITAL_COMMUNITY): Payer: Medicaid Other

## 2022-09-02 ENCOUNTER — Ambulatory Visit (HOSPITAL_COMMUNITY)
Admission: EM | Admit: 2022-09-02 | Discharge: 2022-09-02 | Disposition: A | Discharge status: Home / Self Care | Payer: Medicaid Other

## 2022-09-02 ENCOUNTER — Other Ambulatory Visit (HOSPITAL_COMMUNITY): Payer: Self-pay

## 2022-09-02 ENCOUNTER — Emergency Department (HOSPITAL_COMMUNITY)
Admission: EM | Admit: 2022-09-02 | Discharge: 2022-09-02 | Discharge status: Against Medical Advice | Payer: Medicaid Other | Attending: Emergency Medicine | Admitting: Emergency Medicine

## 2022-09-02 ENCOUNTER — Encounter (HOSPITAL_COMMUNITY): Payer: Self-pay

## 2022-09-02 ENCOUNTER — Other Ambulatory Visit: Payer: Self-pay

## 2022-09-02 ENCOUNTER — Other Ambulatory Visit: Payer: Self-pay | Admitting: Internal Medicine

## 2022-09-02 DIAGNOSIS — Z5321 Procedure and treatment not carried out due to patient leaving prior to being seen by health care provider: Secondary | ICD-10-CM | POA: Insufficient documentation

## 2022-09-02 DIAGNOSIS — R000 Tachycardia, unspecified: Secondary | ICD-10-CM | POA: Insufficient documentation

## 2022-09-02 DIAGNOSIS — M79602 Pain in left arm: Secondary | ICD-10-CM | POA: Insufficient documentation

## 2022-09-02 DIAGNOSIS — R0789 Other chest pain: Secondary | ICD-10-CM | POA: Diagnosis not present

## 2022-09-02 LAB — BASIC METABOLIC PANEL
Anion gap: 11 (ref 5–15)
BUN: 15 mg/dL (ref 6–20)
CO2: 21 mmol/L — ABNORMAL LOW (ref 22–32)
Calcium: 9.3 mg/dL (ref 8.9–10.3)
Chloride: 103 mmol/L (ref 98–111)
Creatinine, Ser: 0.85 mg/dL (ref 0.61–1.24)
GFR, Estimated: >60 mL/min (ref >60)
Glucose, Bld: 182 mg/dL — ABNORMAL HIGH (ref 70–99)
Potassium: 3.7 mmol/L (ref 3.5–5.1)
Sodium: 135 mmol/L (ref 135–145)

## 2022-09-02 LAB — CBC
HCT: 33.6 % — ABNORMAL LOW (ref 39.0–52.0)
Hemoglobin: 10.3 g/dL — ABNORMAL LOW (ref 13.0–17.0)
MCH: 30.1 pg (ref 26.0–34.0)
MCHC: 30.7 g/dL (ref 30.0–36.0)
MCV: 98.2 fL (ref 80.0–100.0)
Platelets: 416 10*3/uL — ABNORMAL HIGH (ref 150–400)
RBC: 3.42 MIL/uL — ABNORMAL LOW (ref 4.22–5.81)
RDW: 13 % (ref 11.5–15.5)
WBC: 10.1 10*3/uL (ref 4.0–10.5)
nRBC: 0 % (ref 0.0–0.2)

## 2022-09-02 LAB — TROPONIN I (HIGH SENSITIVITY)
Troponin I (High Sensitivity): 5 ng/L (ref <18)
Troponin I (High Sensitivity): 5 ng/L (ref <18)

## 2022-09-02 MED ORDER — PANCRELIPASE (LIP-PROT-AMYL) 36000-114000 UNITS PO CPEP
36000.0000 [IU] | ORAL_CAPSULE | Freq: Three times a day (TID) | ORAL | 0 refills | Status: DC
  Filled 2022-09-02: qty 100, 34d supply, fill #0

## 2022-09-02 NOTE — ED Triage Notes (Signed)
Pt c/o left sided chest pain that radiates to left neck and into left rib area. PT states it's painful to breathe deeply. Pt states he's been trying ot breathe shallowly due to pain. Pt able to speak in complete sentences

## 2022-09-02 NOTE — ED Provider Triage Note (Signed)
Emergency Medicine Provider Triage Evaluation Note  ZAKHAR WILINSKI , a 42 y.o. male  was evaluated in triage.  Pt complains of  left sided chest pain for 4 hours.  He notes that he was having chest tightness for about the past 5 days.  Also associated left arm pain that started with the chest pain. Denies any long car rides, plane rides, calf pain, calf swelling, hormonal therapy   Review of Systems  Positive: As above Negative: As above  Physical Exam  BP 120/85   Pulse (!) 105   Temp 99.9 F (37.7 C) (Oral)   Resp 16   Ht 5\' 7"  (1.702 m)   Wt 64.1 kg   SpO2 100%   BMI 22.13 kg/m  Gen:   Awake, no distress   Resp:  Normal effort  MSK:   Moves extremities without difficulty   Tachycardia  Medical Decision Making  Medically screening exam initiated at 4:05 PM.  Appropriate orders placed.  VIDAL KIENITZ was informed that the remainder of the evaluation will be completed by another provider, this initial triage assessment does not replace that evaluation, and the importance of remaining in the ED until their evaluation is complete.     Arabella Merles, PA-C 09/02/22 1630

## 2022-09-02 NOTE — ED Notes (Signed)
Tech called pt 3 times, no response

## 2022-09-02 NOTE — ED Notes (Signed)
Called for vitals and no answer. 

## 2022-09-10 ENCOUNTER — Ambulatory Visit: Payer: Medicaid Other | Admitting: Student

## 2022-09-10 VITALS — BP 117/85 | HR 77 | Temp 98.6°F | Ht 67.0 in | Wt 151.0 lb

## 2022-09-10 DIAGNOSIS — R109 Unspecified abdominal pain: Secondary | ICD-10-CM

## 2022-09-10 DIAGNOSIS — R1013 Epigastric pain: Secondary | ICD-10-CM

## 2022-09-10 DIAGNOSIS — K859 Acute pancreatitis without necrosis or infection, unspecified: Secondary | ICD-10-CM

## 2022-09-10 MED ORDER — OXYCODONE HCL 5 MG PO TABS
5.0000 mg | ORAL_TABLET | Freq: Three times a day (TID) | ORAL | 0 refills | Status: DC | PRN
Start: 2022-09-10 — End: 2022-09-11

## 2022-09-10 MED ORDER — PROCHLORPERAZINE MALEATE 5 MG PO TABS
5.0000 mg | ORAL_TABLET | Freq: Three times a day (TID) | ORAL | 0 refills | Status: DC | PRN
Start: 2022-09-10 — End: 2022-09-11

## 2022-09-10 MED ORDER — PANTOPRAZOLE SODIUM 40 MG PO TBEC
40.0000 mg | DELAYED_RELEASE_TABLET | Freq: Every day | ORAL | 0 refills | Status: DC
Start: 2022-09-10 — End: 2022-09-11

## 2022-09-10 NOTE — Patient Instructions (Addendum)
Thank you, Mr.Molly R Reason for allowing [place] to provide your care today. Today we discussed your worsening abdominal pain which I think is likely an acute pancreatitis episode. After discussing it, we will aim to treat it at home. I have collected labs to monitor your liver, blood counts, and pancreas.  I am also prescribing you with a short course of a strong pain medication that you can take up to 8 hours as needed for pain.   It is important that you stay hydrated and eating as your able. If you start developing nausea that does not let you drink, take one tablet of compazine. Reserve this for instances when you cannot keep food down.  I am also prescribing you with pantoprazole 40 mg daily - you should take about 15-30 min before food in the AM to minimize acid reflux, acid induced pain.    If you start getting worse, not better, or the pain is not controlled, or your heart rate is above 100 beats per 60 seconds, please get evaluated in the emergency department. That may be time to get more work up for potential complications.  Referrals: -Please attend your GI follow up coming up on 8/24   I have ordered the following labs for you:   Lab Orders         CMP14 + Anion Gap         CBC no Diff         Lipase      I will call if any are abnormal. All of your labs can be accessed through "My Chart".   My Chart Access: [email or web address]  Please follow-up in: call back to make an appointment in 2 week for follow up    We look forward to seeing you next time. Please call our clinic at [phone] if you have any questions or concerns. The best time to call is Monday-Friday from 9am-4pm, but there is someone available 24/7. If after hours or the weekend, call the main hospital number and ask for the Internal Medicine Resident On-Call. If you need medication refills, please notify your pharmacy one week in advance and they will send [place] a request.    Thank you for letting us take part in your care. Wishing you the best!  Morene Crocker, MD 09/10/2022, 4:31 PM [facility] Internal Medicine Resident, PGY-2

## 2022-09-10 NOTE — Progress Notes (Unsigned)
Subjective:  CC: abdominal pain  HPI:  Alexander [name] is a 42 y.o. male with a past medical history stated below and presents today for abdominal pain. Please see problem based assessment and plan for additional details.  Past Medical History:  Diagnosis Date   Alcohol use disorder    Alcoholic hepatitis    Anemia    Gonorrhea    Hypertension    Pancreatitis     Current Outpatient Medications on File Prior to Visit  Medication Sig Dispense Refill   acetaminophen (TYLENOL) 500 MG tablet Take 1 tablet (500 mg total) by mouth every 6 (six) hours as needed. (Patient taking differently: Take 500 mg by mouth every 6 (six) hours as needed for mild pain.) 30 tablet 0   amLODipine (NORVASC) 5 MG tablet Take 1 tablet (5 mg total) by mouth daily. 90 tablet 11   emtricitabine-tenofovir AF (DESCOVY) 200-25 MG tablet Take 1 tablet by mouth daily. 30 tablet 2   ferrous sulfate 325 (65 FE) MG EC tablet Take 1 tablet (325 mg total) by mouth every other day. 20 tablet 0   gabapentin (NEURONTIN) 300 MG capsule Take 1 capsule (300 mg total) by mouth 3 (three) times daily. 90 capsule 2   lipase/protease/amylase (CREON) 36000 UNITS CPEP capsule Take 1 capsule (36,000 Units total) by mouth 3 (three) times daily with meals. 100 capsule 0   polyethylene glycol (MIRALAX / GLYCOLAX) 17 g packet Mix 1 packet (17 grams) in beverage and take by mouth daily as needed. 14 each 0   senna (SENOKOT) 8.6 MG TABS tablet Take 1 tablet (8.6 mg total) by mouth daily as needed for mild constipation. 100 tablet 0   sulfamethoxazole-trimethoprim (BACTRIM) 400-80 MG tablet Take 1 tablet by mouth daily. 4 tablet 0   No current facility-administered medications on file prior to visit.    Family History  Problem Relation Age of Onset   Healthy Mother    Hypertension Father     Social History   Socioeconomic History   Marital status: Single    Spouse name: Not on file   Number of children: Not on file   Years of  education: Not on file   Highest education level: Not on file  Occupational History   Not on file  Tobacco Use   Smoking status: Never   Smokeless tobacco: Never  Vaping Use   Vaping status: Every Day   Substances: Nicotine  Substance and Sexual Activity   Alcohol use: Not Currently   Drug use: Never   Sexual activity: Yes    Partners: Male    Comment: follows with health department for descovy  Other Topics Concern   Not on file  Social History Narrative   Not on file   Social Determinants of Health   Financial Resource Strain: Not on file  Food Insecurity: No Food Insecurity (07/16/2022)   Hunger Vital Sign    Worried About Running Out of Food in the Last Year: Never true    Ran Out of Food in the Last Year: Never true  Transportation Needs: No Transportation Needs (07/16/2022)   PRAPARE - Administrator, Civil Service (Medical): No    Lack of Transportation (Non-Medical): No  Physical Activity: Not on file  Stress: Not on file  Social Connections: Not on file  Intimate Partner Violence: Not At Risk (07/16/2022)   Humiliation, Afraid, Rape, and Kick questionnaire    Fear of Current or Ex-Partner: No  Emotionally Abused: No    Physically Abused: No    Sexually Abused: No    Review of Systems: ROS negative except for what is noted on the assessment and plan.  Objective:   Vitals:   09/10/22 1543  BP: 117/85  Pulse: 77  Temp: 98.6 F (37 C)  TempSrc: Oral  SpO2: 100%  Weight: 151 lb (68.5 kg)  Height: 5\' 7"  (1.702 m)    Physical Exam: Constitutional: well-appearing man in mild distress HENT: normocephalic atraumatic, mucous membranes moist Eyes: conjunctiva non-erythematous Neck: supple Cardiovascular: regular rate and rhythm, no m/r/g, Radial, DP and TP pulses 2+ and symmetric Pulmonary/Chest: normal work of breathing on room air, lungs clear to auscultation bilaterally. No rales. No dullness or hyperresonance on lung exam. Abdominal: Bowel  sounds present, mild tenderness in  left upper quadrant, no rebound tenderness. NO CVA tenderness. No suprapubic tenderness. MSK: normal bulk and tone. No flapping tremors Neurological: alert & oriented x 3, 5/5 strength in bilateral upper and lower extremities, normal gait Skin:no jaundice. Proper skin turgor.  warm and dry Psych: Pleasant mood and affect     Assessment & Plan:   Abdominal pain Patient with a history of heavy alcohol use, now in remission with Gabapentin treatment, and  a recent admission to the hospital in June with alcohol-induced severe pancreatitis necrosis involving the neck, body and proximal tail of pancreas, alcoholic hepatitis, and inflammatory splenic vein occlusion with collaterals presents to clinic for evaluation of a 1-week course of progressing epigastric and LUQ abdominal pain that is constant at baseline ~4/10 and worse when he eats 6/10. This pain is now waking him from his sleep. It has been associated with mild nausea but no vomiting. He has been able to eat and drink though his appetite has been low.  No reflux or dyspepsia symptoms which the patient has felt in the past. No fever or changing in his stool patterns or frequency. No hematemesis or melena or hematochezia. No changes in his urine pattern, color.   Patient denies chest pain today, but was recently evaluated in the ED about a week ago for L sided chest pain with deep inspiration without ACS, CXR improved from prior without apparent L pleural effusion seen before. He left the ED because long wait. Today, he mentions that the pain is not in the chest but feels mild discomfort when he takes deep breaths, mostly in the left side if his chest. No changes with positioning. No SOB, sputum production or cough.  Given this patient's recent history of severe acute pancreatitis without cholelithiasis, suspect recurrence of the same. Low suspicion for complications given today's physical exam, vital signs, and  patient's history. Suspect pleuritic chest wall pain in the setting of referred pain.   Reviewed EKG for QT prolongation prior to prescribing nausea medication, which resolved, and incidentally saw changes ST segment changes in V1 and V2. I am reassured that this pain is improved and that the patient has done well for the past month.   This patient has has extensive imaging in the past 2 months, thus, I do not think he needs repeat imaging today. Discussed with patient options of treating at home with supportive treatment of oral hydration and pain management with the understanding that if the pain is not controlled, new onset fever, inability to stay hydrated, or worsening state he needs to be seen in the ED vs going to the ED today to obtain imaging. Patient opted for supportive treatment at home.  Plan - Oxycodone 5 mg q 8HR for severe pain - CMP, CBC, and lipase today -If lipase negative, will continue therapy as described above. Would not order CT scan -Return in 2 weeks for follow up -Compazine 5mg  x 3 doses provided -Precautionary symptoms to seek medical attention reviewed, especially recurrence of chest pain for EKG and imaging evaluation     Return in about 2 weeks (around 09/24/2022) for Resolution of symptoms from abdominal pain.  Patient discussed with Dr. Rosalia Hammers, MD [facility] Internal Medicine Program - PGY-2 09/11/2022, 8:51 AM

## 2022-09-11 ENCOUNTER — Encounter: Payer: Self-pay | Admitting: Student

## 2022-09-11 ENCOUNTER — Other Ambulatory Visit: Payer: Self-pay | Admitting: *Deleted

## 2022-09-11 ENCOUNTER — Other Ambulatory Visit (HOSPITAL_COMMUNITY): Payer: Self-pay

## 2022-09-11 DIAGNOSIS — K859 Acute pancreatitis without necrosis or infection, unspecified: Secondary | ICD-10-CM

## 2022-09-11 DIAGNOSIS — R109 Unspecified abdominal pain: Secondary | ICD-10-CM | POA: Insufficient documentation

## 2022-09-11 DIAGNOSIS — R1013 Epigastric pain: Secondary | ICD-10-CM

## 2022-09-11 MED ORDER — OXYCODONE HCL 5 MG PO TABS
5.0000 mg | ORAL_TABLET | Freq: Three times a day (TID) | ORAL | 0 refills | Status: AC | PRN
Start: 2022-09-11 — End: 2022-09-16
  Filled 2022-09-11: qty 15, 5d supply, fill #0

## 2022-09-11 MED ORDER — PANTOPRAZOLE SODIUM 40 MG PO TBEC
40.0000 mg | DELAYED_RELEASE_TABLET | Freq: Every day | ORAL | 0 refills | Status: DC
Start: 2022-09-11 — End: 2022-11-08
  Filled 2022-09-11: qty 30, 30d supply, fill #0

## 2022-09-11 MED ORDER — PROCHLORPERAZINE MALEATE 5 MG PO TABS
5.0000 mg | ORAL_TABLET | Freq: Three times a day (TID) | ORAL | 0 refills | Status: DC | PRN
Start: 2022-09-11 — End: 2023-08-30
  Filled 2022-09-11: qty 3, 1d supply, fill #0

## 2022-09-11 NOTE — Assessment & Plan Note (Addendum)
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 This pain is now waking him from his sleep. It has been associated with mild nausea but no vomiting. He has been able to eat and drink though his appetite has been low.  No reflux or dyspepsia symptoms which the patient has felt in the past. No fever or changing in his stool patterns or frequency. No hematemesis or melena or hematochezia. No changes in his urine pattern, color.   Patient denies chest pain today, but was recently evaluated in the ED about a week ago for L sided chest pain with deep inspiration without ACS, CXR improved from prior without apparent L pleural effusion seen before. He left the ED because long wait. Today, he mentions that the pain is not in the chest but feels mild discomfort when he takes deep breaths, mostly in the left side if his chest. No changes with positioning. No SOB, sputum production or cough.  Given this patient's recent history of severe acute pancreatitis without cholelithiasis, suspect recurrence of the same. Low suspicion for complications given today's physical exam, vital signs, and patient's history. Suspect pleuritic chest wall pain in the setting of referred pain.   Reviewed EKG for QT prolongation prior to prescribing nausea medication, which resolved, and incidentally saw changes ST segment changes in V1 and V2. I am reassured that this pain is improved and that the patient has done well for the past month.   This patient has has extensive imaging in the past 2 months, thus, I  do not think he needs repeat imaging today. Discussed with patient options of treating at home with supportive treatment of oral hydration and pain management with the understanding that if the pain is not controlled, new onset fever, inability to stay hydrated, or worsening state he needs to be seen in the ED vs going to the ED today to obtain imaging. Patient opted for supportive treatment at home. Plan - Oxycodone 5 mg q 8HR for severe pain - CMP, CBC, and lipase today -If lipase negative, will continue therapy as described above. Would not order CT scan -Return in 2 weeks for follow up -Compazine 5mg  x 3 doses provided -Precautionary symptoms to seek medical attention reviewed, especially recurrence of chest pain for EKG and imaging evaluation

## 2022-09-11 NOTE — Telephone Encounter (Signed)
Patient was called after call to [facility] and informed that prescriptions are being worked on now.

## 2022-09-12 NOTE — Progress Notes (Signed)
Discussed reassuring results with patient. He was able to pick up medications and feels improvement.

## 2022-09-12 NOTE — Progress Notes (Signed)
 Internal Medicine Clinic Attending  Case discussed with the resident physician at the time of the visit.  We reviewed the patient's history, exam, and pertinent patient test results.  I agree with the assessment, diagnosis, and plan of care documented in the resident's note.

## 2022-09-16 ENCOUNTER — Other Ambulatory Visit (HOSPITAL_COMMUNITY): Payer: Self-pay

## 2022-09-17 ENCOUNTER — Other Ambulatory Visit (HOSPITAL_COMMUNITY): Payer: Self-pay

## 2022-09-17 MED ORDER — BOOSTRIX 5-2.5-18.5 LF-MCG/0.5 IM SUSY
PREFILLED_SYRINGE | INTRAMUSCULAR | 0 refills | Status: DC
  Filled 2022-09-17: qty 0.5, 1d supply, fill #0

## 2022-09-23 ENCOUNTER — Other Ambulatory Visit (INDEPENDENT_AMBULATORY_CARE_PROVIDER_SITE_OTHER): Payer: Medicaid Other

## 2022-09-23 ENCOUNTER — Ambulatory Visit (INDEPENDENT_AMBULATORY_CARE_PROVIDER_SITE_OTHER): Payer: Medicaid Other | Admitting: Gastroenterology

## 2022-09-23 ENCOUNTER — Encounter: Payer: Self-pay | Admitting: Gastroenterology

## 2022-09-23 VITALS — BP 90/66 | HR 84 | Ht 66.5 in | Wt 150.1 lb

## 2022-09-23 DIAGNOSIS — Z8719 Personal history of other diseases of the digestive system: Secondary | ICD-10-CM | POA: Diagnosis not present

## 2022-09-23 DIAGNOSIS — R1013 Epigastric pain: Secondary | ICD-10-CM

## 2022-09-23 DIAGNOSIS — R110 Nausea: Secondary | ICD-10-CM

## 2022-09-23 LAB — CBC WITH DIFFERENTIAL/PLATELET
Basophils Absolute: 0.1 10*3/uL (ref 0.0–0.1)
Basophils Relative: 1.2 % (ref 0.0–3.0)
Eosinophils Absolute: 0.1 10*3/uL (ref 0.0–0.7)
Eosinophils Relative: 1.5 % (ref 0.0–5.0)
HCT: 32.5 % — ABNORMAL LOW (ref 39.0–52.0)
Hemoglobin: 10.4 g/dL — ABNORMAL LOW (ref 13.0–17.0)
Lymphocytes Relative: 26.2 % (ref 12.0–46.0)
Lymphs Abs: 2.4 10*3/uL (ref 0.7–4.0)
MCHC: 31.9 g/dL (ref 30.0–36.0)
MCV: 91.7 fl (ref 78.0–100.0)
Monocytes Absolute: 0.6 10*3/uL (ref 0.1–1.0)
Monocytes Relative: 7.1 % (ref 3.0–12.0)
Neutro Abs: 5.8 10*3/uL (ref 1.4–7.7)
Neutrophils Relative %: 64 % (ref 43.0–77.0)
Platelets: 390 10*3/uL (ref 150.0–400.0)
RBC: 3.55 Mil/uL — ABNORMAL LOW (ref 4.22–5.81)
RDW: 13.8 % (ref 11.5–15.5)
WBC: 9 10*3/uL (ref 4.0–10.5)

## 2022-09-23 LAB — COMPREHENSIVE METABOLIC PANEL
ALT: 4 U/L (ref 0–53)
AST: 10 U/L (ref 0–37)
Albumin: 3.3 g/dL — ABNORMAL LOW (ref 3.5–5.2)
Alkaline Phosphatase: 67 U/L (ref 39–117)
BUN: 9 mg/dL (ref 6–23)
CO2: 28 mEq/L (ref 19–32)
Calcium: 9.4 mg/dL (ref 8.4–10.5)
Chloride: 103 mEq/L (ref 96–112)
Creatinine, Ser: 0.6 mg/dL (ref 0.40–1.50)
GFR: 117.51 mL/min (ref >60.00)
Glucose, Bld: 117 mg/dL — ABNORMAL HIGH (ref 70–99)
Potassium: 4.2 mEq/L (ref 3.5–5.1)
Sodium: 140 mEq/L (ref 135–145)
Total Bilirubin: 0.9 mg/dL (ref 0.2–1.2)
Total Protein: 7 g/dL (ref 6.0–8.3)

## 2022-09-23 LAB — LIPASE: Lipase: 49 U/L (ref 11.0–59.0)

## 2022-09-23 NOTE — Patient Instructions (Addendum)
Your provider has requested that you go to the basement level for lab work before leaving today. Press "B" on the elevator. The lab is located at the first door on the left as you exit the elevator.  Due to recent changes in healthcare laws, you may see the results of your imaging and laboratory studies on MyChart before your provider has had a chance to review them.  We understand that in some cases there may be results that are confusing or concerning to you. Not all laboratory results come back in the same time frame and the provider may be waiting for multiple results in order to interpret others.  Please give [place] 48 hours in order for your provider to thoroughly review all the results before contacting the office for clarification of your results.   Continue your present medicines.  You have been scheduled for a CT scan of the abdomen and pelvis at [facility], 1st floor Radiology. You are scheduled on 09/24/2022 at 2:30pm. You should arrive 15 minutes prior to your appointment time for registration.   If you have any questions regarding your exam or if you need to reschedule, you may call [facility] at [phone] between the hours of 8:00 am and 5:00 pm, Monday-Friday.   I appreciate the opportunity to care for you. Boone Master, PA-C

## 2022-09-23 NOTE — Progress Notes (Signed)
Chief Complaint: Hospital Follow up Primary GI MD: Dr. Barron Alvine (initial hospital consult)  HPI: 42 year old male with EtOH use disorder (750 mL bottle of gin last 1.5 days) presents for hospital follow-up of pancreatitis and alcoholic hepatitis.  Patient admitted 07/11/2022-07/23/2022 for acute pancreatitis.  Patient initially presented with nausea, vomiting, abdominal pain with CTAP showing acute pancreatitis.  Initial lipase was 1737.  Initial lactic acid 8.5 on admission which had improved to 1.0 after fluid resuscitation.  Patient was treated with aggressive fluid resuscitation and pain control.    6/15 patient's oxygen requirements increased likely due to volume overload from IV fluids.  This resulted in being placed on BiPAP and transferred to ICU.  He was able to come off BiPAP and placed back on the floor by 6/19.    Cortrak placement was discussed, but patient felt he could progress with PO. repeat CT abdomen pelvis 6/19 with severe acute pancreatitis with necrosis and peripancreatic fluid.  No drainable abscesses or pseudocysts.  Increased caliber of small bowel loops suggesting ileus.  Occlusion of splenic vein with collateral vessel formation.  This was read again by heme-onc who determined this was more likely due to occlusion from inflammation versus thrombosis.  Also showed severe hepatic steatosis with newly diagnosed ascites.  LFTs consistent with alcoholic hepatitis and placement was placed on prednisolone taper.  Lilly score was less than 0.5 suggesting a response to his prednisolone.  Hepatitis panel was positive for hepatitis B core IgM. HCV antibody negative.  Hepatitis B surface antibody reflects immune status. HBV DNA was negative   Patient recently seen by internal medicine 09/10/2022 and  lipase was obtained which was 173.  CBC was negative for leukocytosis.   CMP showed normal LFTs. Albumin 3.4. hgb 10.4  ----------------TODAY--------------------------  Patient  has not had any alcohol intake since 07/10/2022.  After being discharged from the hospital he had significant improvement until about 3 weeks ago when his pain returned.  States he has severe periumbilical pain that is worse with eating.  He also has left mid axillary pain below his third intercostal that hurts worse if he takes a deep breath or moves.  Denies trauma.  He is taking his Creon even if he does not eat.  He states he has been unable to eat due to nausea and pain.  Does not want to go to the hospital as he has to work.  Feels his pain is similar to when he was admitted for pancreatitis.  He states he is having bowel movements but they vary in consistency.  Patient is on oxycodone 5 mg every 8 hour for pain per internal medicine  Past Medical History:  Diagnosis Date   Alcohol use disorder    Alcoholic hepatitis    Anemia    Gonorrhea    Hypertension    Pancreatitis     History reviewed. No pertinent surgical history.  Current Outpatient Medications  Medication Sig Dispense Refill   amLODipine (NORVASC) 5 MG tablet Take 1 tablet (5 mg total) by mouth daily. 90 tablet 11   emtricitabine-tenofovir AF (DESCOVY) 200-25 MG tablet Take 1 tablet by mouth daily. 30 tablet 2   gabapentin (NEURONTIN) 300 MG capsule Take 1 capsule (300 mg total) by mouth 3 (three) times daily. 90 capsule 2   lipase/protease/amylase (CREON) 36000 UNITS CPEP capsule Take 1 capsule (36,000 Units total) by mouth 3 (three) times daily with meals. 100 capsule 0   pantoprazole (PROTONIX) 40 MG tablet Take 1 tablet (40  mg total) by mouth at bedtime. 30 tablet 0   polyethylene glycol (MIRALAX / GLYCOLAX) 17 g packet Mix 1 packet (17 grams) in beverage and take by mouth daily as needed. 14 each 0   prochlorperazine (COMPAZINE) 5 MG tablet Take 1 tablet (5 mg total) by mouth every 8 (eight) hours as needed for up to 3 doses for vomiting. 3 tablet 0   senna (SENOKOT) 8.6 MG TABS tablet Take 1 tablet (8.6 mg total) by  mouth daily as needed for mild constipation. 100 tablet 0   acetaminophen (TYLENOL) 500 MG tablet Take 1 tablet (500 mg total) by mouth every 6 (six) hours as needed. (Patient not taking: Reported on 09/23/2022) 30 tablet 0   ferrous sulfate 325 (65 FE) MG EC tablet Take 1 tablet (325 mg total) by mouth every other day. (Patient not taking: Reported on 09/23/2022) 20 tablet 0   No current facility-administered medications for this visit.    Allergies as of 09/23/2022   (No Known Allergies)    Family History  Problem Relation Age of Onset   Healthy Mother    Hypertension Father     Social History   Socioeconomic History   Marital status: Single    Spouse name: Not on file   Number of children: Not on file   Years of education: Not on file   Highest education level: Not on file  Occupational History   Not on file  Tobacco Use   Smoking status: Never   Smokeless tobacco: Never  Vaping Use   Vaping status: Every Day   Substances: Nicotine  Substance and Sexual Activity   Alcohol use: Not Currently   Drug use: Never   Sexual activity: Yes    Partners: Male    Comment: follows with health department for descovy  Other Topics Concern   Not on file  Social History Narrative   Not on file   Social Determinants of Health   Financial Resource Strain: Not on file  Food Insecurity: No Food Insecurity (07/16/2022)   Hunger Vital Sign    Worried About Running Out of Food in the Last Year: Never true    Ran Out of Food in the Last Year: Never true  Transportation Needs: No Transportation Needs (07/16/2022)   PRAPARE - Administrator, Civil Service (Medical): No    Lack of Transportation (Non-Medical): No  Physical Activity: Not on file  Stress: Not on file  Social Connections: Not on file  Intimate Partner Violence: Not At Risk (07/16/2022)   Humiliation, Afraid, Rape, and Kick questionnaire    Fear of Current or Ex-Partner: No    Emotionally Abused: No     Physically Abused: No    Sexually Abused: No    Review of Systems:    Constitutional: No weight loss, fever, chills, weakness or fatigue HEENT: Eyes: No change in vision               Ears, Nose, Throat:  No change in hearing or congestion Skin: No rash or itching Cardiovascular: No chest pain, chest pressure or palpitations   Respiratory: No SOB or cough Gastrointestinal: See HPI and otherwise negative Genitourinary: No dysuria or change in urinary frequency Neurological: No headache, dizziness or syncope Musculoskeletal: No new muscle or joint pain Hematologic: No bleeding or bruising Psychiatric: No history of depression or anxiety    Physical Exam:  Vital signs: BP 90/66 (BP Location: Left Arm, Patient Position: Sitting, Cuff Size: Normal)  Pulse 84   Ht 5' 6.5" (1.689 m) Comment: height measured without shoes  Wt 68.1 kg   BMI 23.87 kg/m   Constitutional: NAD, Well developed, Well nourished, alert and cooperative Head:  Normocephalic and atraumatic. Eyes:   PEERL, EOMI. No icterus. Conjunctiva pink. Respiratory: Respirations even and unlabored. Lungs clear to auscultation bilaterally.   No wheezes, crackles, or rhonchi.  Cardiovascular:  Regular rate and rhythm. No peripheral edema, cyanosis or pallor.  Gastrointestinal: Soft, nondistended, epigastric tenderness.  No rebound tenderness.  Normal bowel sounds.   Rectal:  Not performed.  Msk:  Symmetrical without gross deformities. Without edema, no deformity or joint abnormality.  Neurologic:  Alert and  oriented x4;  grossly normal neurologically.  Skin:   Dry and intact without significant lesions or rashes. Psychiatric: Oriented to person, place and time. Demonstrates good judgement and reason without abnormal affect or behaviors.   RELEVANT LABS AND IMAGING: CBC    Component Value Date/Time   WBC 6.9 09/10/2022 1647   WBC 10.1 09/02/2022 1613   RBC 3.34 (L) 09/10/2022 1647   RBC 3.42 (L) 09/02/2022 1613   HGB  10.4 (L) 09/10/2022 1647   HCT 31.2 (L) 09/10/2022 1647   PLT 421 09/10/2022 1647   MCV 93 09/10/2022 1647   MCH 31.1 09/10/2022 1647   MCH 30.1 09/02/2022 1613   MCHC 33.3 09/10/2022 1647   MCHC 30.7 09/02/2022 1613   RDW 12.3 09/10/2022 1647   LYMPHSABS 1.3 07/23/2022 0137   MONOABS 0.9 07/23/2022 0137   EOSABS 0.0 07/23/2022 0137   BASOSABS 0.1 07/23/2022 0137    CMP     Component Value Date/Time   NA 143 09/10/2022 1647   K 4.1 09/10/2022 1647   CL 105 09/10/2022 1647   CO2 27 09/10/2022 1647   GLUCOSE 89 09/10/2022 1647   GLUCOSE 182 (H) 09/02/2022 1613   BUN 8 09/10/2022 1647   CREATININE 0.59 (L) 09/10/2022 1647   CALCIUM 9.7 09/10/2022 1647   PROT 6.1 09/10/2022 1647   ALBUMIN 3.4 (L) 09/10/2022 1647   AST 12 09/10/2022 1647   ALT 7 09/10/2022 1647   ALKPHOS 79 09/10/2022 1647   BILITOT 0.3 09/10/2022 1647   GFRNONAA >60 09/02/2022 1613     Assessment/Plan:   42 year old male history of recent admission for acute alcoholic pancreatitis with pancreatic necrosis without fluid collection on CTAP 6/19 improved with fluid resuscitation/abx/ and p.o. challenge as well as history of alcoholic hepatitis put on prednisolone taper with Lille score showing response to steroids presenting for hospital follow-up. No alcohol use since 6/12, presenting today with recurrent symptoms and difficulty tolerating PO in addition to left midaxillary pain at 4th intercostal (possibly referred pain). Hypotensive in office with 90/66  History of acute pancreatitis -Lipase, CMP, CBC - STAT CT abdomen pelvis with contrast to evaluate for possible necrosis or pancreatic pseudocyst  - Advised to go to emergency department if worsening symptoms.  Advised expedited workup if he presents to ED. Patient would like to avoid admission if possible.  He is aware that if his symptoms worsen he needs to present to emergency department.  Lara Mulch [facility] 09/23/2022, 11:48  AM  Cc: Earl Lagos, MD

## 2022-09-24 ENCOUNTER — Ambulatory Visit (HOSPITAL_COMMUNITY)
Admission: RE | Admit: 2022-09-24 | Discharge: 2022-09-24 | Disposition: A | Discharge status: Home / Self Care | Payer: Medicaid Other | Source: Ambulatory Visit | Attending: Gastroenterology | Admitting: Gastroenterology

## 2022-09-24 DIAGNOSIS — Z8719 Personal history of other diseases of the digestive system: Secondary | ICD-10-CM | POA: Insufficient documentation

## 2022-09-24 DIAGNOSIS — R1013 Epigastric pain: Secondary | ICD-10-CM | POA: Insufficient documentation

## 2022-09-24 DIAGNOSIS — R110 Nausea: Secondary | ICD-10-CM | POA: Diagnosis present

## 2022-09-24 MED ORDER — SODIUM CHLORIDE (PF) 0.9 % IJ SOLN
INTRAMUSCULAR | Status: AC
  Filled 2022-09-24: qty 50

## 2022-09-24 MED ORDER — IOHEXOL 300 MG/ML  SOLN
100.0000 mL | Freq: Once | INTRAMUSCULAR | Status: AC | PRN
  Administered 2022-09-24: 100 mL via INTRAVENOUS

## 2022-09-26 ENCOUNTER — Other Ambulatory Visit: Payer: Self-pay | Admitting: *Deleted

## 2022-09-26 DIAGNOSIS — Z8719 Personal history of other diseases of the digestive system: Secondary | ICD-10-CM

## 2022-09-26 DIAGNOSIS — R933 Abnormal findings on diagnostic imaging of other parts of digestive tract: Secondary | ICD-10-CM

## 2022-09-26 NOTE — Progress Notes (Signed)
Patient has been scheduled for MRCP at [facility] on Saturday, 09/28/22 at 9 am, 830 am arrival. Patient should enter thru emergency department and let them know he is there for outpatient MRI. NPO 4 hours.   I have spoken to patient to advise of abnormalities on CT with need for further evaluation using MRCP. Advised of appointment time/date/location and prep. Patient verbalizes understanding of this.   He has been advised to present to the emergency room should his pain worsen or fever develop etc.

## 2022-09-27 ENCOUNTER — Telehealth: Payer: Self-pay | Admitting: *Deleted

## 2022-09-27 DIAGNOSIS — Z8719 Personal history of other diseases of the digestive system: Secondary | ICD-10-CM

## 2022-09-27 DIAGNOSIS — R933 Abnormal findings on diagnostic imaging of other parts of digestive tract: Secondary | ICD-10-CM

## 2022-09-27 NOTE — Telephone Encounter (Signed)
-----   Message from [facility] sent at 09/27/2022  1:18 PM EDT ----- Regarding: CT Please do CT ab.pelvis pancreatic protocol with contrast in 3 weeks. DX: pancreatic pseudocyst

## 2022-09-27 NOTE — Telephone Encounter (Signed)
MRCP cancelled as per Dr Mansouraty/Bayley Darreld Mclean, PA-C request. Patient has instead been scheduled for CT abd/pelvis with pancreatic protocol at [facility] 10/18/22 at 300 pm, 245 pm arrival. NPO 4 hours prior.   Patient has been advised that our pancreatic specialist feels that the pseudocyst seen on CT is not mature enough to be able to be drained yet. However, we would like to repeat the imaging in 3 weeks and at that time evaluate if more fluid has accumulated within the cyst at which time it may be possible to drain. Patient verbalizes understanding of this.

## 2022-09-28 ENCOUNTER — Ambulatory Visit (HOSPITAL_COMMUNITY): Payer: Medicaid Other

## 2022-09-28 ENCOUNTER — Encounter (HOSPITAL_COMMUNITY): Payer: Self-pay

## 2022-10-03 NOTE — Progress Notes (Signed)
Agree with the assessment and plan as outlined by Bailey McMichael, PA-C.  Vito Cirigliano, DO, FACG  

## 2022-10-10 ENCOUNTER — Other Ambulatory Visit (HOSPITAL_COMMUNITY): Payer: Self-pay

## 2022-10-10 ENCOUNTER — Other Ambulatory Visit: Payer: Self-pay | Admitting: Internal Medicine

## 2022-10-10 MED ORDER — PANCRELIPASE (LIP-PROT-AMYL) 36000-114000 UNITS PO CPEP
36000.0000 [IU] | ORAL_CAPSULE | Freq: Three times a day (TID) | ORAL | 0 refills | Status: DC
  Filled 2022-10-10: qty 100, 34d supply, fill #0

## 2022-10-11 ENCOUNTER — Other Ambulatory Visit (HOSPITAL_COMMUNITY): Payer: Self-pay

## 2022-10-18 ENCOUNTER — Ambulatory Visit (HOSPITAL_COMMUNITY)
Admission: RE | Admit: 2022-10-18 | Discharge: 2022-10-18 | Disposition: A | Discharge status: Home / Self Care | Payer: Medicaid Other | Source: Ambulatory Visit | Attending: Gastroenterology

## 2022-10-18 DIAGNOSIS — Z8719 Personal history of other diseases of the digestive system: Secondary | ICD-10-CM | POA: Insufficient documentation

## 2022-10-18 DIAGNOSIS — R933 Abnormal findings on diagnostic imaging of other parts of digestive tract: Secondary | ICD-10-CM | POA: Insufficient documentation

## 2022-10-18 MED ORDER — IOHEXOL 300 MG/ML  SOLN
100.0000 mL | Freq: Once | INTRAMUSCULAR | Status: AC | PRN
  Administered 2022-10-18: 100 mL via INTRAVENOUS

## 2022-10-29 ENCOUNTER — Other Ambulatory Visit: Payer: Self-pay

## 2022-10-29 ENCOUNTER — Telehealth: Payer: Self-pay

## 2022-10-29 DIAGNOSIS — Z8719 Personal history of other diseases of the digestive system: Secondary | ICD-10-CM

## 2022-10-29 NOTE — Telephone Encounter (Signed)
EUS has been set up for 12/12/22 at 345 pm at [facility] with GM   CT ordered and sent to the schedulers for 2 weeks prior to the EUS  Left message on machine to call back

## 2022-10-29 NOTE — Telephone Encounter (Signed)
Legrand Como, PA-C  Loretha Stapler, RN Hey there, Lam Bjorklund! I tried to call patient to inform him of the findings and plan, his voicemail has not been set up unfortunately. Let me know if there is anything I can do to help. Thanks so much for organizing all of this!

## 2022-10-29 NOTE — Telephone Encounter (Signed)
OK. Let's make sure we try to reach out in a couple of weeks and ensure he is in agreement with plan, otherwise we will take back the procedure visit. GM

## 2022-10-29 NOTE — Telephone Encounter (Signed)
-----   Message from [facility] sent at 10/28/2022  5:30 PM EDT ----- Regarding: RE: pancreatic Jamey Harman, Please tentatively hold November 14 for this patient for upper EUS cystgastrostomy and plan for repeat CT abdomen 1 to 2 weeks before so that we can review where things stand at that point. Thanks. GM ----- Message ----- From: Shellia Cleverly, DO Sent: 10/28/2022   5:27 PM EDT To: Legrand Como, PA-C; # Subject: RE: pancreatic                                 I think that seems like a very reasonable plan to get him a spot for EUS and possible cyst gastrostomy with repeat imaging 2 weeks prior.  If feeling better then will monitor him clinically/conservatively.  Thanks for looking this case over!  VC ----- Message ----- From: Lemar Lofty., MD Sent: 10/28/2022   4:46 PM EDT To: Legrand Como, PA-C; # Subject: RE: pancreatic                                 BM, Thanks for reaching out.  I look at the CT, and there does look to be some progression in regards to maturation. Probably need a couple more weeks to be better. We can offer the patient if he still having issues a November 14 date for EUS and possible cystgastrostomy. I would recommend a repeat imaging study at least a week or 2 before to see how things have evolved. If he is feeling better then he may not need anything other than just monitoring with imaging. Frequent repeat imaging will do a CT abdomen with IV and oral contrast. Let me know what you all decide and then we can forward to Kazia Grisanti to schedule. Thanks. GM ----- Message ----- From: Jearl Klinefelter Sent: 10/28/2022   8:34 AM EDT To: Lemar Lofty., MD Subject: pancreatic                                      Hey Dr. Meridee Score!  Hope you had a wonderful weekend! We had spoken about this patient a while back. I had waited 3 weeks and this is the repeat CT in which we were hoping the cyst would mature so he could  undergo cystgastrostomy. However, these findings do not appear that is possible I am guessing? I will bring him in for lipase and CA 19-9. Any other recommendations on what we can do for this gentleman? I greatly appreciate your help.  Thank you, Bayley ----- Message ----- From: Interface, Rad Results In Sent: 10/25/2022   4:42 PM EDT To: Legrand Como, PA-C

## 2022-10-30 NOTE — Telephone Encounter (Signed)
EUS scheduled, pt instructed and medications reviewed.  Patient instructions mailed to home.  Patient to call with any questions or concerns. CT will be scheduled. The pt will call if he has any questions or concerns

## 2022-10-30 NOTE — Telephone Encounter (Signed)
I spoke with the pt and he agrees to the CT and EUS- all questions answered to the best of my ability.

## 2022-10-30 NOTE — Telephone Encounter (Signed)
Thanks Patty. GM 

## 2022-11-08 ENCOUNTER — Other Ambulatory Visit: Payer: Self-pay | Admitting: Student

## 2022-11-08 ENCOUNTER — Other Ambulatory Visit: Payer: Self-pay | Admitting: Internal Medicine

## 2022-11-08 DIAGNOSIS — R1013 Epigastric pain: Secondary | ICD-10-CM

## 2022-11-08 DIAGNOSIS — G629 Polyneuropathy, unspecified: Secondary | ICD-10-CM

## 2022-11-11 ENCOUNTER — Other Ambulatory Visit (HOSPITAL_COMMUNITY): Payer: Self-pay

## 2022-11-11 MED ORDER — FERROUS SULFATE 325 (65 FE) MG PO TBEC
325.0000 mg | DELAYED_RELEASE_TABLET | ORAL | 3 refills | Status: DC
Start: 2022-11-11 — End: 2023-08-30
  Filled 2022-11-11: qty 15, 30d supply, fill #0
  Filled 2023-02-10: qty 15, 30d supply, fill #1
  Filled 2023-03-09: qty 15, 30d supply, fill #2
  Filled 2023-06-02 – 2023-07-01 (×3): qty 15, 30d supply, fill #3

## 2022-11-11 MED ORDER — GABAPENTIN 300 MG PO CAPS
300.0000 mg | ORAL_CAPSULE | Freq: Three times a day (TID) | ORAL | 3 refills | Status: DC
  Filled 2022-11-11: qty 270, 90d supply, fill #0
  Filled 2022-11-20 – 2023-02-10 (×2): qty 270, 90d supply, fill #1

## 2022-11-11 MED ORDER — PANTOPRAZOLE SODIUM 40 MG PO TBEC
40.0000 mg | DELAYED_RELEASE_TABLET | Freq: Every day | ORAL | 3 refills | Status: DC
Start: 2022-11-11 — End: 2023-10-22
  Filled 2022-11-11: qty 90, 90d supply, fill #0
  Filled 2023-02-10: qty 90, 90d supply, fill #1
  Filled 2023-05-31: qty 90, 90d supply, fill #2
  Filled 2023-06-02 – 2023-08-30 (×4): qty 90, 90d supply, fill #3

## 2022-11-14 ENCOUNTER — Other Ambulatory Visit (HOSPITAL_COMMUNITY): Payer: Self-pay

## 2022-11-20 ENCOUNTER — Other Ambulatory Visit (HOSPITAL_COMMUNITY): Payer: Self-pay

## 2022-11-20 ENCOUNTER — Other Ambulatory Visit: Payer: Self-pay | Admitting: Student

## 2022-11-20 MED ORDER — CREON 36000-114000 UNITS PO CPEP
36000.0000 [IU] | ORAL_CAPSULE | Freq: Three times a day (TID) | ORAL | 0 refills | Status: DC
  Filled 2022-11-20: qty 100, 34d supply, fill #0

## 2022-11-26 ENCOUNTER — Ambulatory Visit (HOSPITAL_COMMUNITY)
Admission: RE | Admit: 2022-11-26 | Discharge: 2022-11-26 | Disposition: A | Discharge status: Home / Self Care | Payer: Medicaid Other | Source: Ambulatory Visit | Attending: Gastroenterology | Admitting: Gastroenterology

## 2022-11-26 ENCOUNTER — Encounter (HOSPITAL_COMMUNITY): Payer: Self-pay

## 2022-11-26 DIAGNOSIS — Z8719 Personal history of other diseases of the digestive system: Secondary | ICD-10-CM | POA: Diagnosis present

## 2022-11-26 MED ORDER — IOHEXOL 300 MG/ML  SOLN
100.0000 mL | Freq: Once | INTRAMUSCULAR | Status: AC | PRN
  Administered 2022-11-26: 100 mL via INTRAVENOUS

## 2022-12-11 ENCOUNTER — Other Ambulatory Visit (HOSPITAL_COMMUNITY): Payer: Self-pay

## 2022-12-13 ENCOUNTER — Encounter: Payer: Self-pay | Admitting: *Deleted

## 2023-01-02 ENCOUNTER — Other Ambulatory Visit (HOSPITAL_COMMUNITY): Payer: Self-pay

## 2023-01-02 ENCOUNTER — Other Ambulatory Visit: Payer: Self-pay | Admitting: Student

## 2023-01-02 MED ORDER — PANCRELIPASE (LIP-PROT-AMYL) 36000-114000 UNITS PO CPEP
36000.0000 [IU] | ORAL_CAPSULE | Freq: Three times a day (TID) | ORAL | 0 refills | Status: DC
  Filled 2023-01-02: qty 100, 34d supply, fill #0

## 2023-01-03 ENCOUNTER — Ambulatory Visit (INDEPENDENT_AMBULATORY_CARE_PROVIDER_SITE_OTHER): Payer: Medicaid Other | Admitting: Gastroenterology

## 2023-01-03 ENCOUNTER — Encounter: Payer: Self-pay | Admitting: Gastroenterology

## 2023-01-03 VITALS — BP 110/80 | HR 76 | Ht 66.5 in | Wt 143.0 lb

## 2023-01-03 DIAGNOSIS — F1011 Alcohol abuse, in remission: Secondary | ICD-10-CM

## 2023-01-03 DIAGNOSIS — K860 Alcohol-induced chronic pancreatitis: Secondary | ICD-10-CM

## 2023-01-03 DIAGNOSIS — Z8719 Personal history of other diseases of the digestive system: Secondary | ICD-10-CM

## 2023-01-03 DIAGNOSIS — K863 Pseudocyst of pancreas: Secondary | ICD-10-CM | POA: Diagnosis not present

## 2023-01-03 DIAGNOSIS — F1091 Alcohol use, unspecified, in remission: Secondary | ICD-10-CM

## 2023-01-03 NOTE — Patient Instructions (Addendum)
_______________________________________________________  If your blood pressure at your visit was 140/90 or greater, please contact your primary care physician to follow up on this.  _______________________________________________________  If you are age 44 or older, your body mass index should be between 23-30. Your Body mass index is 22.74 kg/m. If this is out of the aforementioned range listed, please consider follow up with your Primary Care Provider.  If you are age 61 or younger, your body mass index should be between 19-25. Your Body mass index is 22.74 kg/m. If this is out of the aformentioned range listed, please consider follow up with your Primary Care Provider.   ________________________________________________________  The  GI providers would like to encourage you to use [facility] to communicate with providers for non-urgent requests or questions.  Due to long hold times on the telephone, sending your provider a message by [facility] may be a faster and more efficient way to get a response.  Please allow 48 business hours for a response.  Please remember that this is for non-urgent requests.  _______________________________________________________  Bonita Quin have been scheduled for an EUS.  Please follow written instructions given to you at your visit today.  If you use inhalers (even only as needed), please bring them with you on the day of your procedure.  If you take any of the following medications, they will need to be adjusted prior to your procedure:   DO NOT TAKE 7 DAYS PRIOR TO TEST- Trulicity (dulaglutide) Ozempic, Wegovy (semaglutide) Mounjaro (tirzepatide) Bydureon Bcise (exanatide extended release)  DO NOT TAKE 1 DAY PRIOR TO YOUR TEST Rybelsus (semaglutide) Adlyxin (lixisenatide) Victoza (liraglutide) Byetta (exanatide) ___________________________________________________________________________  Due to recent changes in healthcare laws, you may see the  results of your imaging and laboratory studies on MyChart before your provider has had a chance to review them.  We understand that in some cases there may be results that are confusing or concerning to you. Not all laboratory results come back in the same time frame and the provider may be waiting for multiple results in order to interpret others.  Please give [place] 48 hours in order for your provider to thoroughly review all the results before contacting the office for clarification of your results.   It was a pleasure to see you today!  Vito Cirigliano, D.O.

## 2023-01-03 NOTE — Progress Notes (Signed)
Chief Complaint:    Pancreatitis   HPI:     Patient is a 42 y.o. male presenting to the [facility] for follow-up.  Was last seen in the office by Cira Servant on 09/23/2022 for hospital follow-up after admission for EtOH pancreatitis and EtOH hepatitis in 06/2022.  Was treated with prednisolone.  At the time of his last appointment, was still having epigastric pain and decreased p.o. tolerance. - Normal WBC, H/H stable at 10/32 with normal PLT - Albumin 3.3, otherwise normal CMP.  Normal lipase - 09/24/2022: CT A/P: Complex branching pseudocyst of the pancreatic tail with cephalad components extending adjacent to the stomach, spleen, lesser sac with more caudad components extending down into central mesentery.  Small regions of pseudocyst separated from the pancreas itself extending down right and left paracolic gutter adjacent to psoas muscle.  Parenchymal loss/thinning in portions of pancreatic body and tail compatible with prior pancreatic necrosis - 10/18/2022: CT A/P: Similar pseudocyst in LUQ abutting inferior margin of the spleen.  Similar clustered nodularity or enhancement extending along inferior margin of pseudocyst anterior to left kidney adjacent to colon and paracolic gutter.  Continued soft tissue density lobulations along right paracolic gutter, all similar to CT on 8/27, favored to be pseudocyst.  Substantial thinning and hypoenhancement of pancreatic body and tail similar to previous exam suggesting some localized prior pancreatic necrosis or atrophy.  Slight indistinct soft tissue margins along pancreatic head - 11/26/2022: CT A/P: Enlarging pancreatic pseudocyst measuring 4 x 14 cm.  Attenuation of the pancreatic body and proximal tail indicative of necrosis.  Associated soft tissue thickening extending from inferior posterior margin of the pseudocyst and loculated fluid pseudocyst  Imaging previously reviewed with the advanced pancreaticobiliary service and had  tentatively planned for EUS with cystogastrostomy on 11/14, which was rescheduled to 02/03/2023.  All in all feels well today.  Last time he had any epigastric pain was about 3-4 weeks ago.  Appetite waxes/wanes, but overall tolerating p.o. intake.  Weight stable.  No EtOH since hospital admission in 06/2022. Occasional oily stools, but no acholic stools. Still using Creon.    Review of systems:     No chest pain, no SOB, no fevers, no urinary sx   Past Medical History:  Diagnosis Date   Alcohol use disorder    Alcoholic hepatitis    Anemia    Gonorrhea    Hypertension    Pancreatic pseudocyst    Pancreatitis     Patient's surgical history, family medical history, social history, medications and allergies were all reviewed in Epic    Current Outpatient Medications  Medication Sig Dispense Refill   acetaminophen (TYLENOL) 500 MG tablet Take 1 tablet (500 mg total) by mouth every 6 (six) hours as needed. 30 tablet 0   amLODipine (NORVASC) 5 MG tablet Take 1 tablet (5 mg total) by mouth daily. 90 tablet 11   emtricitabine-tenofovir AF (DESCOVY) 200-25 MG tablet Take 1 tablet by mouth daily. 30 tablet 2   ferrous sulfate 325 (65 FE) MG EC tablet Take 1 tablet (325 mg total) by mouth every other day. 15 tablet 3   gabapentin (NEURONTIN) 300 MG capsule Take 1 capsule (300 mg total) by mouth 3 (three) times daily. 270 capsule 3   lipase/protease/amylase (CREON) 36000 UNITS CPEP capsule Take 1 capsule (36,000 Units total) by mouth 3 (three) times daily with meals. 100 capsule 0   pantoprazole (PROTONIX) 40 MG tablet Take 1 tablet (40 mg total) by mouth at bedtime.  90 tablet 3   polyethylene glycol (MIRALAX / GLYCOLAX) 17 g packet Mix 1 packet (17 grams) in beverage and take by mouth daily as needed. 14 each 0   prochlorperazine (COMPAZINE) 5 MG tablet Take 1 tablet (5 mg total) by mouth every 8 (eight) hours as needed for up to 3 doses for vomiting. 3 tablet 0   senna (SENOKOT) 8.6 MG TABS  tablet Take 1 tablet (8.6 mg total) by mouth daily as needed for mild constipation. 100 tablet 0   No current facility-administered medications for this visit.    Physical Exam:     BP 110/80 (BP Location: Left Arm, Patient Position: Sitting, Cuff Size: Normal)   Pulse 76   Ht 5' 6.5" (1.689 m)   Wt 143 lb (64.9 kg)   SpO2 95%   BMI 22.74 kg/m   GENERAL:  Pleasant male in NAD PSYCH: : Cooperative, normal affect NEURO: Alert and oriented x 3, no focal neurologic deficits   IMPRESSION and PLAN:    1) History of acute pancreatitis 2) Pancreatic pseudocyst - Plan for EUS with possible cyst gastrostomy on 02/02/2022 with Dr. Meridee Score.  We discussed EUS and cystogastrostomy today - Continue Creon for the time being  3) History of EtOH use disorder-in remission - No EtOH since 06/2022 - Congratulated him on his continued complete abstinence of all EtOH  4) History of EtOH hepatitis - Completed prednisone - As above, complete abstinence of all EtOH - Labs in 08/2022 with normal liver enzymes  I spent 35 minutes of time, including in depth chart review, independent review of results as outlined above, communicating results with the patient directly, face-to-face time with the patient, coordinating care, and ordering studies and medications as appropriate, and documentation.           Shellia Cleverly ,DO, FACG 01/03/2023, 3:38 PM

## 2023-01-11 IMAGING — CT CT HEAD W/O CM
4 series · 16 of 47 positions shown, 18 images · non-contrast
Comparison: None.

CLINICAL DATA: Headache



[Series 3: head wo · axial · 0.45mm/px · z∈[+1225,+1345]mm · 7 of 32 slices shown, 9 images]
[im 4/32  brain]
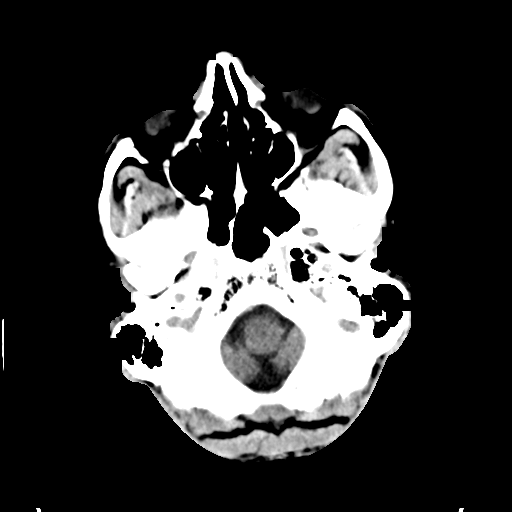
[im 4/32  bone]
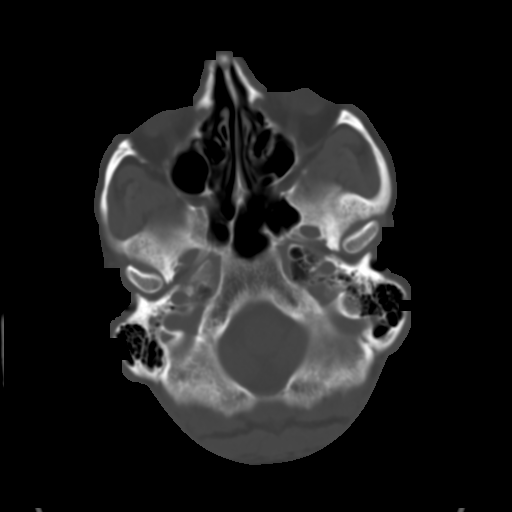
[im 8/32  brain]
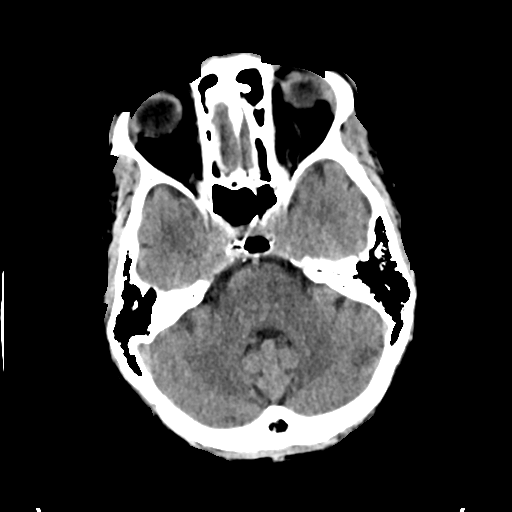
[im 12/32  brain]
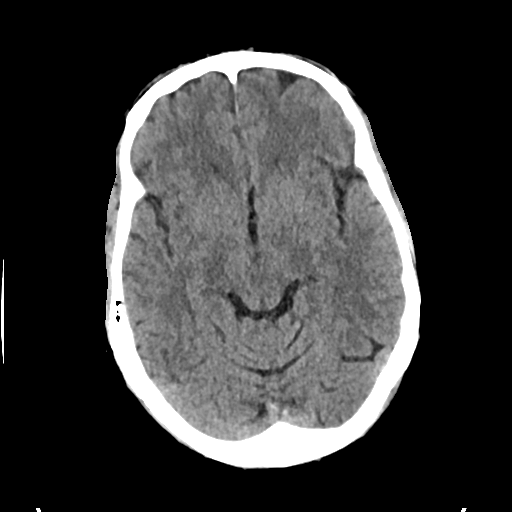
[im 16/32  brain]
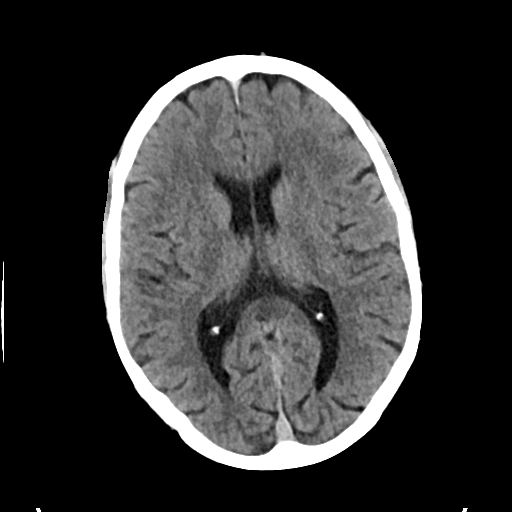
[im 20/32  brain]
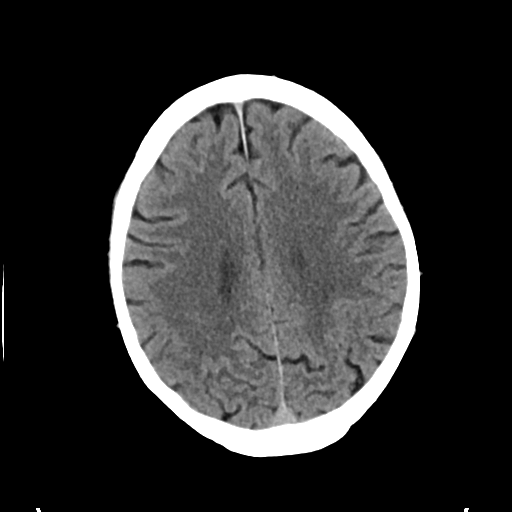
[im 20/32  bone]
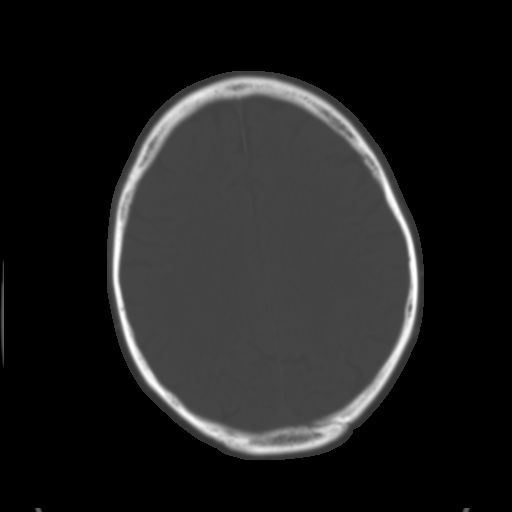
[im 24/32  brain]
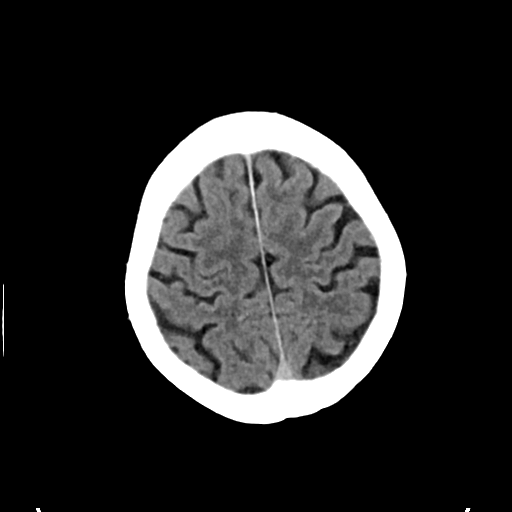
[im 28/32  brain]
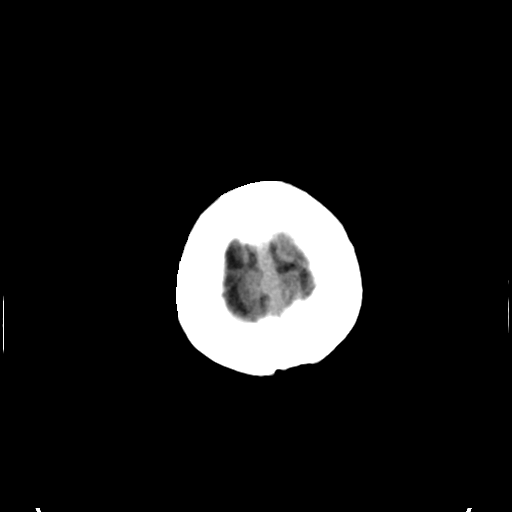

[Series 4: head bone · axial · 0.45mm/px · z∈[+1224,+1256]mm · 3 of 79 slices shown]
[im 8/79  bone]
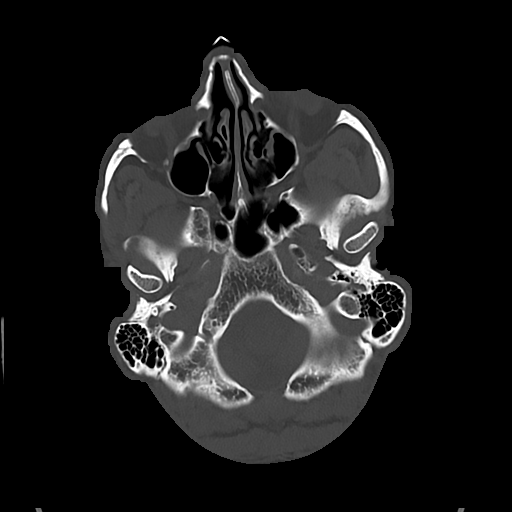
[im 16/79  bone]
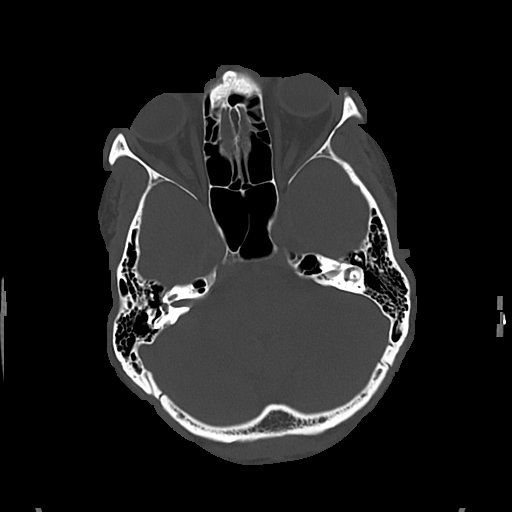
[im 24/79  bone]
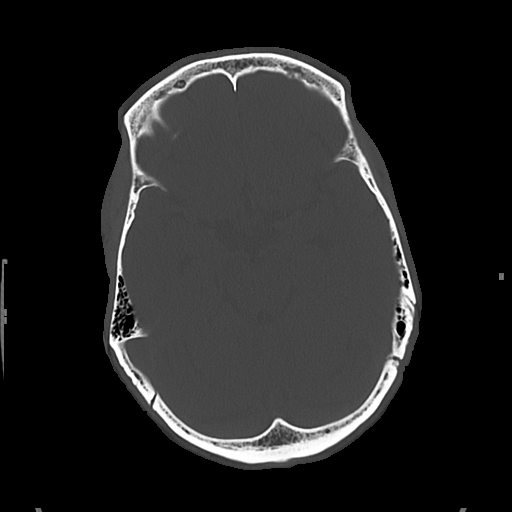

[Series 5: cor soft · coronal · 0.33mm/px · 3 of 73 slices shown]
[im 25/73  brain]
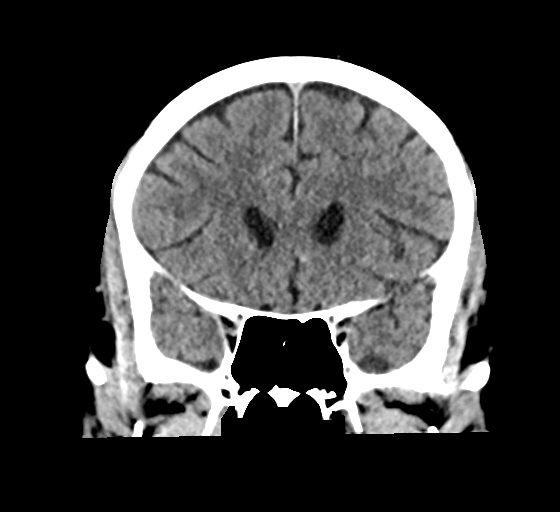
[im 33/73  brain]
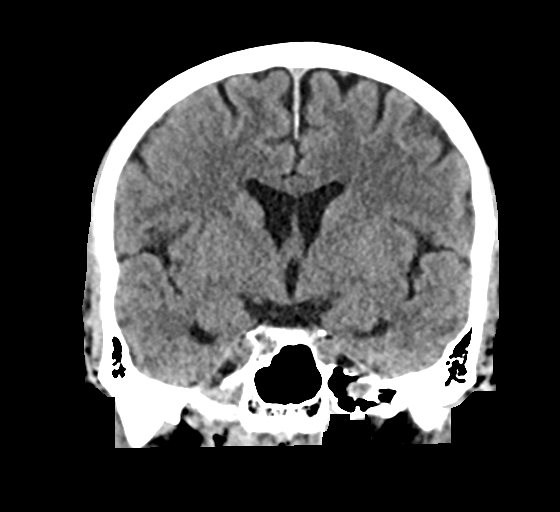
[im 41/73  brain]
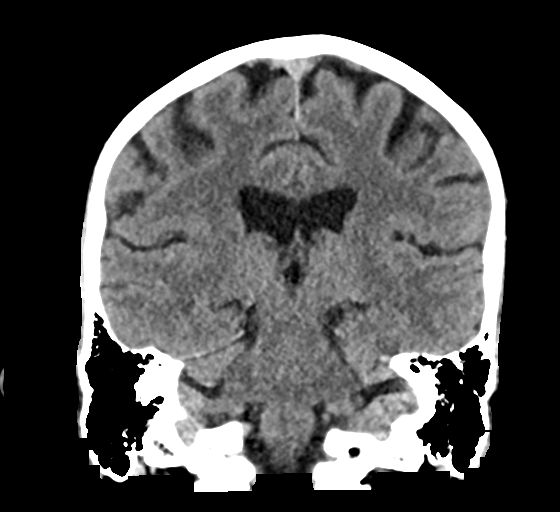

[Series 6: sag soft · sagittal · 0.32mm/px · 3 of 62 slices shown]
[im 21/62  brain]
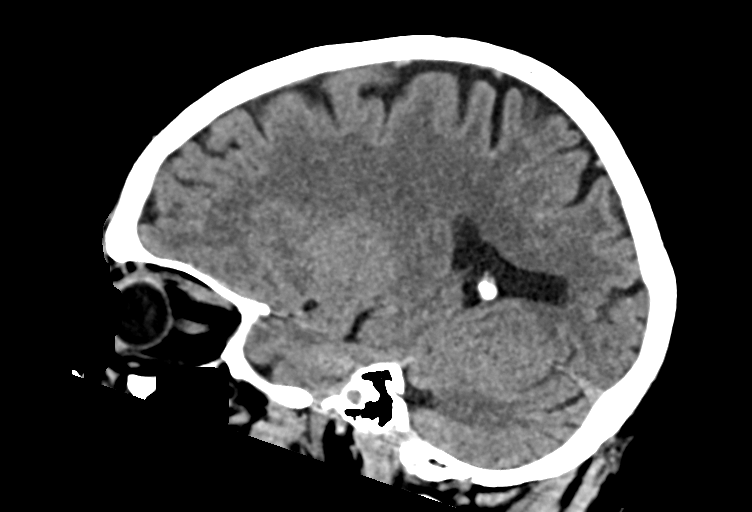
[im 31/62  brain]
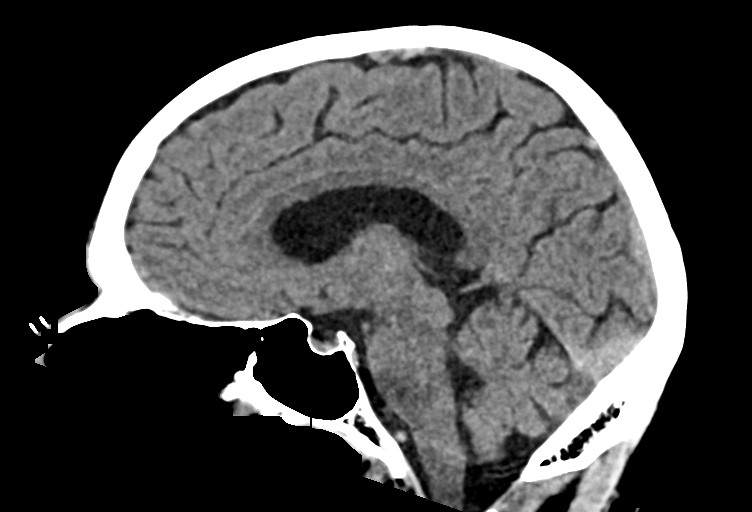
[im 41/62  brain]
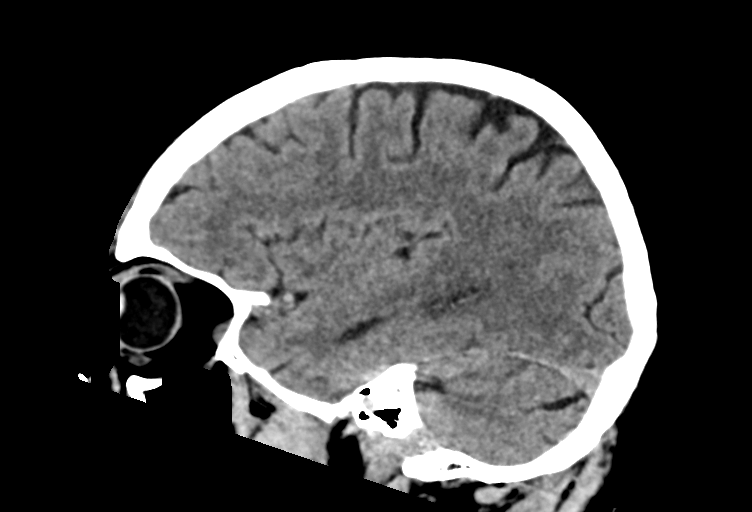

[16 of 47 positions shown; findings below may reference images not displayed]

FINDINGS: Brain: No acute intracranial hemorrhage, mass effect, or herniation.
No extra-axial fluid collections. No evidence of acute territorial
infarct. No hydrocephalus.

Vascular: No hyperdense vessel or unexpected calcification.

Skull: Normal. Negative for fracture or focal lesion.

Sinuses/Orbits: No acute finding.

Other: None.
IMPRESSION: No acute intracranial process identified.

## 2023-01-23 ENCOUNTER — Encounter (HOSPITAL_COMMUNITY): Payer: Self-pay | Admitting: Gastroenterology

## 2023-01-24 ENCOUNTER — Emergency Department (HOSPITAL_COMMUNITY)
Admission: EM | Admit: 2023-01-24 | Discharge: 2023-01-24 | Discharge status: Against Medical Advice | Payer: Medicaid Other | Attending: Emergency Medicine | Admitting: Emergency Medicine

## 2023-01-24 ENCOUNTER — Other Ambulatory Visit: Payer: Self-pay

## 2023-01-24 ENCOUNTER — Encounter (HOSPITAL_COMMUNITY): Payer: Self-pay | Admitting: Emergency Medicine

## 2023-01-24 DIAGNOSIS — Z79899 Other long term (current) drug therapy: Secondary | ICD-10-CM | POA: Insufficient documentation

## 2023-01-24 DIAGNOSIS — Z5329 Procedure and treatment not carried out because of patient's decision for other reasons: Secondary | ICD-10-CM | POA: Insufficient documentation

## 2023-01-24 DIAGNOSIS — R1084 Generalized abdominal pain: Secondary | ICD-10-CM | POA: Insufficient documentation

## 2023-01-24 DIAGNOSIS — I10 Essential (primary) hypertension: Secondary | ICD-10-CM | POA: Diagnosis not present

## 2023-01-24 LAB — CBC
HCT: 39.3 % (ref 39.0–52.0)
Hemoglobin: 12.6 g/dL — ABNORMAL LOW (ref 13.0–17.0)
MCH: 29.4 pg (ref 26.0–34.0)
MCHC: 32.1 g/dL (ref 30.0–36.0)
MCV: 91.6 fL (ref 80.0–100.0)
Platelets: 295 10*3/uL (ref 150–400)
RBC: 4.29 MIL/uL (ref 4.22–5.81)
RDW: 13.9 % (ref 11.5–15.5)
WBC: 10.3 10*3/uL (ref 4.0–10.5)
nRBC: 0 % (ref 0.0–0.2)

## 2023-01-24 LAB — ETHANOL: Alcohol, Ethyl (B): <10 mg/dL (ref <10)

## 2023-01-24 LAB — COMPREHENSIVE METABOLIC PANEL
ALT: 16 U/L (ref 0–44)
AST: 16 U/L (ref 15–41)
Albumin: 3.9 g/dL (ref 3.5–5.0)
Alkaline Phosphatase: 71 U/L (ref 38–126)
Anion gap: 6 (ref 5–15)
BUN: 21 mg/dL — ABNORMAL HIGH (ref 6–20)
CO2: 25 mmol/L (ref 22–32)
Calcium: 9.5 mg/dL (ref 8.9–10.3)
Chloride: 107 mmol/L (ref 98–111)
Creatinine, Ser: 0.93 mg/dL (ref 0.61–1.24)
GFR, Estimated: >60 mL/min (ref >60)
Glucose, Bld: 96 mg/dL (ref 70–99)
Potassium: 4 mmol/L (ref 3.5–5.1)
Sodium: 138 mmol/L (ref 135–145)
Total Bilirubin: 0.9 mg/dL (ref <1.2)
Total Protein: 7.2 g/dL (ref 6.5–8.1)

## 2023-01-24 LAB — LIPASE, BLOOD: Lipase: 50 U/L (ref 11–51)

## 2023-01-24 NOTE — ED Provider Notes (Incomplete)
[place] EMERGENCY DEPARTMENT AT [facility] Provider Note   CSN: [id number] Arrival date & time: 01/24/23  [id number]     History {Add pertinent medical, surgical, social history, OB history to HPI:1} Chief Complaint  Patient presents with   Abdominal Pain    Alexander [name] is a 42 y.o. male.  Patient with history of hypertension, alcohol abuse, alcohol induced pancreatitis, hepatitis, constipation presents today with complaints of abdominal pain. He states that same began this morning and has been persistent since. Pain is generalized throughout his abdomen, worse in the left lower quadrant. States that pain is consistent with previous flares of pancreatitis. He states he has been sober from alcohol for about 6 months. Denies fevers or chills. No history of abdominal surgeries. He is followed by GI for his pancreatitis, he takes creon daily and has endoscopy scheduled for 01/06. He has not tried anything for his pain and denies nausea, vomiting, or diarrhea. He is having regular bowel movements and passing flatus. Denies urinary symptoms.  The history is provided by the patient. No language interpreter was used.  Abdominal Pain      Home Medications Prior to Admission medications   Medication Sig Start Date End Date Taking? Authorizing Provider  acetaminophen (TYLENOL) 500 MG tablet Take 1 tablet (500 mg total) by mouth every 6 (six) hours as needed. 02/07/22   Rising, Lurena Joiner, PA-C  amLODipine (NORVASC) 5 MG tablet Take 1 tablet (5 mg total) by mouth daily. 08/23/22   Masters, Katie, DO  emtricitabine-tenofovir AF (DESCOVY) 200-25 MG tablet Take 1 tablet by mouth daily. 01/01/22     ferrous sulfate 325 (65 FE) MG EC tablet Take 1 tablet (325 mg total) by mouth every other day. 11/11/22   Masters, Katie, DO  gabapentin (NEURONTIN) 300 MG capsule Take 1 capsule (300 mg total) by mouth 3 (three) times daily. 11/11/22   Masters, Katie, DO  lipase/protease/amylase (CREON) 36000  UNITS CPEP capsule Take 1 capsule (36,000 Units total) by mouth 3 (three) times daily with meals. 01/02/23   Masters, Katie, DO  pantoprazole (PROTONIX) 40 MG tablet Take 1 tablet (40 mg total) by mouth at bedtime. 11/11/22   Masters, Katie, DO  polyethylene glycol (MIRALAX / GLYCOLAX) 17 g packet Mix 1 packet (17 grams) in beverage and take by mouth daily as needed. 08/06/22   Masters, Florentina Addison, DO  prochlorperazine (COMPAZINE) 5 MG tablet Take 1 tablet (5 mg total) by mouth every 8 (eight) hours as needed for up to 3 doses for vomiting. 09/11/22   Morene Crocker, MD  senna (SENOKOT) 8.6 MG TABS tablet Take 1 tablet (8.6 mg total) by mouth daily as needed for mild constipation. 08/06/22   Masters, Katie, DO      Allergies    Patient has no known allergies.    Review of Systems   Review of Systems  Gastrointestinal:  Positive for abdominal pain.  All other systems reviewed and are negative.   Physical Exam Updated Vital Signs BP 104/68 (BP Location: Right Arm)   Pulse 69   Temp 98.2 F (36.8 C) (Oral)   Resp 19   Ht 5' 6.5" (1.689 m)   Wt 64.9 kg   SpO2 100%   BMI 22.75 kg/m  Physical Exam Vitals and nursing note reviewed.  Constitutional:      General: He is not in acute distress.    Appearance: Normal appearance. He is normal weight. He is not ill-appearing, toxic-appearing or diaphoretic.  HENT:  Head: Normocephalic and atraumatic.  Cardiovascular:     Rate and Rhythm: Normal rate.  Pulmonary:     Effort: Pulmonary effort is normal. No respiratory distress.  Abdominal:     General: Abdomen is flat.     Palpations: Abdomen is soft.     Tenderness: There is generalized abdominal tenderness.  Musculoskeletal:        General: Normal range of motion.     Cervical back: Normal range of motion.  Skin:    General: Skin is warm and dry.  Neurological:     General: No focal deficit present.     Mental Status: He is alert.  Psychiatric:        Mood and Affect: Mood  normal.        Behavior: Behavior normal.     ED Results / Procedures / Treatments   Labs (all labs ordered are listed, but only abnormal results are displayed) Labs Reviewed  COMPREHENSIVE METABOLIC PANEL - Abnormal; Notable for the following components:      Result Value   BUN 21 (*)    All other components within normal limits  CBC - Abnormal; Notable for the following components:   Hemoglobin 12.6 (*)    All other components within normal limits  LIPASE, BLOOD  ETHANOL    EKG None  Radiology No results found.  Procedures Procedures  {Document cardiac monitor, telemetry assessment procedure when appropriate:1}  Medications Ordered in ED Medications - No data to display  ED Course/ Medical Decision Making/ A&P   {   Click here for ABCD2, HEART and other calculatorsREFRESH Note before signing :1}                              Medical Decision Making  This patient is a 42 y.o. male who presents to the ED for concern of abdominal pain, this involves an extensive number of treatment options, and is a complaint that carries with it a high risk of complications and morbidity. The emergent differential diagnosis prior to evaluation includes, but is not limited to,  The differential diagnosis for generalized abdominal pain includes, but is not limited to AAA, gastroenteritis, appendicitis, Bowel obstruction, Bowel perforation. Gastroparesis, DKA, Hernia, Inflammatory bowel disease, mesenteric ischemia, pancreatitis, peritonitis SBP, volvulus.   This is not an exhaustive differential.   Past Medical History / Co-morbidities / Social History:  has a past medical history of Alcohol use disorder, Alcoholic hepatitis, Anemia, Gonorrhea, Hypertension, Pancreatic pseudocyst, and Pancreatitis.  Additional history: Chart reviewed. Pertinent results include: patient with upper endoscopy scheduled for 02/02/22  Physical Exam: Physical exam performed. The pertinent findings include:  ***  Lab Tests: I ordered, and personally interpreted labs.  The pertinent results include:  ***   Imaging Studies: I ordered imaging studies including ***. I independently visualized and interpreted imaging which showed ***. I agree with the radiologist interpretation.   Cardiac Monitoring:  The patient was maintained on a cardiac monitor.  My attending physician Dr. Marland Kitchen viewed and interpreted the cardiac monitored which showed an underlying rhythm of: ***. I agree with this interpretation.   Medications: I ordered medication including ***  for ***. Reevaluation of the patient after these medicines showed that the patient {resolved/improved/worsened:23923::"improved"}. I have reviewed the patients home medicines and have made adjustments as needed.  Consultations Obtained: I requested consultation with the ***,  and discussed lab and imaging findings as well as pertinent plan - they  recommend: ***   Disposition: After consideration of the diagnostic results and the patients response to treatment, I feel that *** .   ***emergency department workup does not suggest an emergent condition requiring admission or immediate intervention beyond what has been performed at this time. The plan is: ***. The patient is safe for discharge and has been instructed to return immediately for worsening symptoms, change in symptoms or any other concerns.  I discussed this case with my attending physician Dr. Marland Kitchen who cosigned this note including patient's presenting symptoms, physical exam, and planned diagnostics and interventions. Attending physician stated agreement with plan or made changes to plan which were implemented.     {Document critical care time when appropriate:1} {Document review of labs and clinical decision tools ie heart score, Chads2Vasc2 etc:1}  {Document your independent review of radiology images, and any outside records:1} {Document your discussion with family members, caretakers, and  with consultants:1} {Document social determinants of health affecting pt's care:1} {Document your decision making why or why not admission, treatments were needed:1} Final Clinical Impression(s) / ED Diagnoses Final diagnoses:  None    Rx / DC Orders ED Discharge Orders     None

## 2023-01-24 NOTE — ED Triage Notes (Signed)
Pt  here from home with c/o luq abd pain chronic in nature , pt has hx of pancreatitis has not had etoh in about 1 month , does have an endoscopy scheduled on jan 6th

## 2023-01-27 ENCOUNTER — Encounter: Payer: Medicaid Other | Admitting: Internal Medicine

## 2023-01-27 NOTE — Progress Notes (Deleted)
Pancreatic pseudocyst Plan for EUS with possible cyst gastrostomy on 02/02/2022 with Dr. Meridee Score.  We discussed EUS and cystogastrostomy today - Continue Creon for the time being  Anemia CBC 12/27 with Hgb improved to 12.6 with normocytic MCV.  Taking iron supplement?  P:

## 2023-02-03 ENCOUNTER — Ambulatory Visit (HOSPITAL_COMMUNITY): Payer: Medicaid Other | Admitting: Anesthesiology

## 2023-02-03 ENCOUNTER — Encounter (HOSPITAL_COMMUNITY): Payer: Self-pay | Admitting: Gastroenterology

## 2023-02-03 ENCOUNTER — Other Ambulatory Visit: Payer: Self-pay

## 2023-02-03 ENCOUNTER — Ambulatory Visit (HOSPITAL_BASED_OUTPATIENT_CLINIC_OR_DEPARTMENT_OTHER): Payer: Medicaid Other | Admitting: Anesthesiology

## 2023-02-03 ENCOUNTER — Ambulatory Visit (HOSPITAL_COMMUNITY)
Admission: RE | Admit: 2023-02-03 | Discharge: 2023-02-03 | Disposition: A | Discharge status: Home / Self Care | Payer: Medicaid Other | Attending: Gastroenterology | Admitting: Gastroenterology

## 2023-02-03 ENCOUNTER — Other Ambulatory Visit (HOSPITAL_COMMUNITY): Payer: Self-pay

## 2023-02-03 ENCOUNTER — Encounter (HOSPITAL_COMMUNITY)
Admission: RE | Disposition: A | Discharge status: Home / Self Care | Payer: Self-pay | Source: Home / Self Care | Attending: Gastroenterology

## 2023-02-03 DIAGNOSIS — K3189 Other diseases of stomach and duodenum: Secondary | ICD-10-CM | POA: Diagnosis not present

## 2023-02-03 DIAGNOSIS — K861 Other chronic pancreatitis: Secondary | ICD-10-CM | POA: Insufficient documentation

## 2023-02-03 DIAGNOSIS — I899 Noninfective disorder of lymphatic vessels and lymph nodes, unspecified: Secondary | ICD-10-CM | POA: Diagnosis not present

## 2023-02-03 DIAGNOSIS — K8681 Exocrine pancreatic insufficiency: Secondary | ICD-10-CM | POA: Diagnosis not present

## 2023-02-03 DIAGNOSIS — I10 Essential (primary) hypertension: Secondary | ICD-10-CM | POA: Insufficient documentation

## 2023-02-03 DIAGNOSIS — Z8719 Personal history of other diseases of the digestive system: Secondary | ICD-10-CM

## 2023-02-03 DIAGNOSIS — K2970 Gastritis, unspecified, without bleeding: Secondary | ICD-10-CM

## 2023-02-03 DIAGNOSIS — G709 Myoneural disorder, unspecified: Secondary | ICD-10-CM | POA: Insufficient documentation

## 2023-02-03 DIAGNOSIS — K2950 Unspecified chronic gastritis without bleeding: Secondary | ICD-10-CM

## 2023-02-03 DIAGNOSIS — K2289 Other specified disease of esophagus: Secondary | ICD-10-CM

## 2023-02-03 DIAGNOSIS — K862 Cyst of pancreas: Secondary | ICD-10-CM

## 2023-02-03 DIAGNOSIS — K859 Acute pancreatitis without necrosis or infection, unspecified: Secondary | ICD-10-CM | POA: Diagnosis not present

## 2023-02-03 HISTORY — PX: BIOPSY: SHX5522

## 2023-02-03 HISTORY — PX: ESOPHAGOGASTRODUODENOSCOPY: SHX5428

## 2023-02-03 HISTORY — PX: EUS: SHX5427

## 2023-02-03 SURGERY — EGD (ESOPHAGOGASTRODUODENOSCOPY)
Anesthesia: Monitor Anesthesia Care

## 2023-02-03 MED ORDER — ROCURONIUM BROMIDE 100 MG/10ML IV SOLN
INTRAVENOUS | Status: DC | PRN
  Administered 2023-02-03: 10 mg via INTRAVENOUS

## 2023-02-03 MED ORDER — FENTANYL CITRATE (PF) 100 MCG/2ML IJ SOLN
INTRAMUSCULAR | Status: AC
  Filled 2023-02-03: qty 2

## 2023-02-03 MED ORDER — MIDAZOLAM HCL 2 MG/2ML IJ SOLN
INTRAMUSCULAR | Status: AC
  Filled 2023-02-03: qty 2

## 2023-02-03 MED ORDER — CIPROFLOXACIN IN D5W 400 MG/200ML IV SOLN
INTRAVENOUS | Status: AC
  Filled 2023-02-03: qty 200

## 2023-02-03 MED ORDER — MIDAZOLAM HCL 5 MG/5ML IJ SOLN
INTRAMUSCULAR | Status: DC | PRN
  Administered 2023-02-03: 1 mg via INTRAVENOUS

## 2023-02-03 MED ORDER — EPHEDRINE SULFATE (PRESSORS) 50 MG/ML IJ SOLN
INTRAMUSCULAR | Status: DC | PRN
  Administered 2023-02-03 (×5): 5 mg via INTRAVENOUS

## 2023-02-03 MED ORDER — SODIUM CHLORIDE 0.9 % IV SOLN: INTRAVENOUS | Status: DC

## 2023-02-03 MED ORDER — OXYCODONE HCL 5 MG PO TABS
5.0000 mg | ORAL_TABLET | Freq: Four times a day (QID) | ORAL | 0 refills | Status: AC | PRN
  Filled 2023-02-03: qty 18, 5d supply, fill #0

## 2023-02-03 MED ORDER — ONDANSETRON HCL 4 MG/2ML IJ SOLN
INTRAMUSCULAR | Status: DC | PRN
  Administered 2023-02-03: 4 mg via INTRAVENOUS

## 2023-02-03 MED ORDER — FENTANYL CITRATE (PF) 100 MCG/2ML IJ SOLN
INTRAMUSCULAR | Status: DC | PRN
  Administered 2023-02-03: 100 ug via INTRAVENOUS

## 2023-02-03 MED ORDER — LIDOCAINE VISCOUS HCL 2 % MT SOLN
10.0000 mL | Freq: Three times a day (TID) | OROMUCOSAL | 1 refills | Status: AC
  Filled 2023-02-03: qty 360, 15d supply, fill #0
  Filled 2023-03-09 – 2023-06-02 (×2): qty 360, 15d supply, fill #1

## 2023-02-03 MED ORDER — SUCCINYLCHOLINE CHLORIDE 200 MG/10ML IV SOSY
PREFILLED_SYRINGE | INTRAVENOUS | Status: DC | PRN
  Administered 2023-02-03: 100 mg via INTRAVENOUS

## 2023-02-03 MED ORDER — DEXAMETHASONE SODIUM PHOSPHATE 4 MG/ML IJ SOLN
INTRAMUSCULAR | Status: DC | PRN
  Administered 2023-02-03: 4 mg via INTRAVENOUS

## 2023-02-03 MED ORDER — SUGAMMADEX SODIUM 200 MG/2ML IV SOLN
INTRAVENOUS | Status: DC | PRN
  Administered 2023-02-03: 100 mg via INTRAVENOUS

## 2023-02-03 MED ORDER — PHENYLEPHRINE HCL (PRESSORS) 10 MG/ML IV SOLN
INTRAVENOUS | Status: DC | PRN
  Administered 2023-02-03 (×2): 80 ug via INTRAVENOUS
  Administered 2023-02-03: 160 ug via INTRAVENOUS
  Administered 2023-02-03 (×2): 80 ug via INTRAVENOUS

## 2023-02-03 MED ORDER — PROPOFOL 10 MG/ML IV BOLUS
INTRAVENOUS | Status: AC
  Filled 2023-02-03: qty 20

## 2023-02-03 MED ORDER — PROPOFOL 10 MG/ML IV BOLUS
INTRAVENOUS | Status: DC | PRN
  Administered 2023-02-03: 40 mg via INTRAVENOUS
  Administered 2023-02-03: 160 mg via INTRAVENOUS

## 2023-02-03 MED ORDER — LIDOCAINE HCL (CARDIAC) PF 100 MG/5ML IV SOSY
PREFILLED_SYRINGE | INTRAVENOUS | Status: DC | PRN
  Administered 2023-02-03: 60 mg via INTRAVENOUS

## 2023-02-03 NOTE — Anesthesia Procedure Notes (Signed)
 Procedure Name: Intubation Date/Time: 02/03/2023 11:23 AM  Performed by: Erick Fitz, CRNAPre-anesthesia Checklist: Patient identified, Emergency Drugs available, Suction available, Patient being monitored and Timeout performed Patient Re-evaluated:Patient Re-evaluated prior to induction Oxygen Delivery Method: Circle system utilized Preoxygenation: Pre-oxygenation with 100% oxygen Induction Type: IV induction Ventilation: Mask ventilation without difficulty Laryngoscope Size: Mac and 4 Grade View: Grade I Tube type: Oral Tube size: 7.0 mm Number of attempts: 1 Airway Equipment and Method: Stylet Placement Confirmation: ETT inserted through vocal cords under direct vision, positive ETCO2, CO2 detector and breath sounds checked- equal and bilateral Secured at: 23 cm Tube secured with: Tape Dental Injury: Teeth and Oropharynx as per pre-operative assessment

## 2023-02-03 NOTE — Op Note (Signed)
 [facility] Patient Name: Alexander [name] Procedure Date: 02/03/2023 MRN: [id number] Attending MD: Aloha Finner , MD, [id number] Date of Birth: October 21, 1980 CSN: [id number] Age: 43 Admit Type: Outpatient Procedure:                Upper EUS Indications:              Pancreatic cyst on CT scan, Acute recurrent                            pancreatitis, Pancreatic cyst, Epigastric abdominal                            pain Providers:                Aloha Finner, MD, Burnard Fire RN, RN, Felice Sar, Technician Referring MD:             Sandor Flatter, MD Medicines:                General Anesthesia Complications:            No immediate complications. Estimated Blood Loss:     Estimated blood loss was minimal. Procedure:                Pre-Anesthesia Assessment:                           - Prior to the procedure, a History and Physical                            was performed, and patient medications and                            allergies were reviewed. The patient's tolerance of                            previous anesthesia was also reviewed. The risks                            and benefits of the procedure and the sedation                            options and risks were discussed with the patient.                            All questions were answered, and informed consent                            was obtained. Prior Anticoagulants: The patient has                            taken no anticoagulant or antiplatelet agents. ASA                            Grade Assessment:  II - A patient with mild systemic                            disease. After reviewing the risks and benefits,                            the patient was deemed in satisfactory condition to                            undergo the procedure.                           After obtaining informed consent, the endoscope was                            passed under direct  vision. Throughout the                            procedure, the patient's blood pressure, pulse, and                            oxygen saturations were monitored continuously. The                            [id number]-[id number] ([id number]) Olympus endoscope was introduced                            through the mouth, and advanced to the second part                            of duodenum. The [id number]-[id number] ([id number]) Olympus                            duodenoscope was introduced through the mouth, and                            advanced to the area of papilla. The [id number]                            ([id number]) Olympus linear ultrasound scope was                            introduced through the mouth, and advanced to the                            duodenum for ultrasound examination from the                            stomach and duodenum. The upper EUS was                            accomplished without difficulty. The patient  tolerated the procedure. Scope In: Scope Out: Findings:      ENDOSCOPIC FINDING: :      No gross lesions were noted in the entire esophagus.      The Z-line was irregular and was found 42 cm from the incisors.      Extrinsic compression on the stomach was found in the gastric body.      Multiple dispersed small erosions with no bleeding and no stigmata of       recent bleeding were found in the entire examined stomach. Biopsies were       taken with a cold forceps for histology and Helicobacter pylori testing.      No gross lesions were noted in the duodenal bulb, in the first portion       of the duodenum and in the second portion of the duodenum.      The major papilla was normal.      ENDOSONOGRAPHIC FINDING: :      A heterogenous (mostly hypoechoic) lesion suggestive of a cyst was       identified in the pancreatic tail. It is not in obvious communication       with the pancreatic duct. The lesion measured 99 mm by 77 mm in maximal        cross-sectional diameter. There was a single compartment thinly       septated. The outer wall of the lesion was thick. There was no       associated mass. There was internal debris within the fluid-filled       cavity but this is less than 20% of the entirety of the cyst. Multiple       views of the region continued to show evidence of a large blood vessel       extending across the entirety of the region of the cyst. It did appear       to be a location where potential double-pigtail stenting could be       considered in the most proximal aspect of the stomach (approximately 2       cm below the GE junction, but the amount of space would not necessarily       allow an adequate LAMS). Another region in the distal aspect of the tail       to body transition suggested approximately 16 mm of length between the       stomach wall and the cyst not adequate for LAMS. There was on multiple       views on multiple occasions blood flow within the cyst cavity without a       clear vessel being seen (not clear if this is cross-sectional issues or       could there be a pseudoaneurysm or blood vessel within the cyst). For       these reasons, cyst gastrostomy creation was not performed today.      There was no sign of significant endosonographic abnormality in the       common bile duct (3.2 mm) and in the common hepatic duct (2.8 mm). An       unremarkable gallbladder and ducts of normal caliber were identified.      Endosonographic imaging of the ampulla showed no intramural       (subepithelial) lesion.      Endosonographic imaging in the visualized portion of the liver showed no       mass.      No  malignant-appearing lymph nodes were visualized in the celiac region       (level 20), peripancreatic region and porta hepatis region.      The celiac region was visualized. Impression:               EGD impression:                           - No gross lesions in the entire esophagus. Z-line                             irregular, 42 cm from the incisors.                           - Extrinsic compression in the gastric body.                           - Erosive gastropathy with no bleeding and no                            stigmata of recent bleeding. Biopsied.                           - No gross lesions in the duodenal bulb, in the                            first portion of the duodenum and in the second                            portion of the duodenum.                           - Normal major papilla.                           EUS impression:                           - A cystic lesion was seen in the pancreatic tail.                            Tissue has not been obtained. However, the                            endosonographic appearance is consistent with a                            pancreatic pseudocyst with some mild walled off                            necrosis. As documented above, a large blood vessel                            is noted across the entirety of the cyst cavity  with what appeared to be some splenic varicosities                            or increased flow. There also appears to be blood                            flow on Doppler on multiple views within the cyst                            cavity itself (though blood vessel not overtly                            seen) that is concerning for the possibility of a                            pseudoaneurysm having developed. I also documented                            above the concerns of the gastric wall and cyst                            wall length that could support the possibility of                            double-pigtail stenting rather than LAMS stenting.                            Additional imaging required.                           - There was no sign of significant pathology in the                            common bile duct and in the common hepatic duct.                            - No malignant-appearing lymph nodes were                            visualized in the celiac region (level 20),                            peripancreatic region and porta hepatis region. Moderate Sedation:      Not Applicable - Patient had care per Anesthesia. Recommendation:           - The patient will be observed post-procedure,                            until all discharge criteria are met.                           - Discharge patient to home.                           -  Patient has a contact number available for                            emergencies. The signs and symptoms of potential                            delayed complications were discussed with the                            patient. Return to normal activities tomorrow.                            Written discharge instructions were provided to the                            patient.                           - Resume previous diet.                           - Observe patient's clinical course.                           - Await path results.                           - Recommend updated imaging with a pancreas                            protocol CT abdomen to be performed for partial                            visualization and rule out of intracystic                            pseudoaneurysm.                           - Unfortunately, based on my availability, repeat                            attempt at cystgastrostomy creation even if no                            pseudoaneurysm is found, is going to be possible                            for the next couple of months so patient referral                            to quaternary center makes sense for increased risk                            history of cystgastrostomy creation attempt and  potential double-pigtail stenting. I will                            communicate this and discussed this with patient's                             primary gastroenterologist.                           - The findings and recommendations were discussed                            with the patient.                           - The findings and recommendations were discussed                            with the patient's family. Procedure Code(s):        --- Professional ---                           [phone], Esophagogastroduodenoscopy, flexible,                            transoral; with endoscopic ultrasound examination                            limited to the esophagus, stomach or duodenum, and                            adjacent structures                           43239, Esophagogastroduodenoscopy, flexible,                            transoral; with biopsy, single or multiple Diagnosis Code(s):        --- Professional ---                           K22.89, Other specified disease of esophagus                           K31.89, Other diseases of stomach and duodenum                           K86.2, Cyst of pancreas                           I89.9, Noninfective disorder of lymphatic vessels                            and lymph nodes, unspecified                           K85.90, Acute pancreatitis without necrosis or  infection, unspecified                           R10.13, Epigastric pain CPT copyright 2022 American Medical Association. All rights reserved. The codes documented in this report are preliminary and upon coder review may  be revised to meet current compliance requirements. Aloha Finner, MD 02/03/2023 12:48:39 PM Number of Addenda: 0

## 2023-02-03 NOTE — Discharge Instructions (Addendum)

## 2023-02-03 NOTE — Progress Notes (Signed)
 Patient and mother evaluated in recovery. Patient with mild discomfort similar to his pancreas issues that he has been dealing with for months (earlier in the week was much worse) but does not feel that this is overtly pancreatitis and we did not perform any interventions that would cause pancreatitis. He is also having a sore throat. I will send in a liquid lidocaine  Magic mouthwash to use as needed and also oxycodone  5 mg every 6 hour as needed for a few days to try to help keep him out of the hospital if possible. Patient and mother agree that if pain worsens or he gets any dehydration that he will need to come to the hospital for further evaluation in case he has another bout of pancreatitis (though again would not be associated with today's procedure since we did no intervention).   Aloha Finner, MD [facility] Office # [phone]

## 2023-02-03 NOTE — Anesthesia Preprocedure Evaluation (Signed)
 Anesthesia Evaluation  Patient identified by MRN, date of birth, ID band Patient awake    Reviewed: Allergy & Precautions, NPO status , Patient's Chart, lab work & pertinent test results  Airway Mallampati: II  TM Distance: >3 FB Neck ROM: Full    Dental  (+) Chipped,    Pulmonary neg pulmonary ROS   Pulmonary exam normal        Cardiovascular hypertension, Pt. on medications Normal cardiovascular exam     Neuro/Psych  Neuromuscular disease  negative psych ROS   GI/Hepatic negative GI ROS,,,(+)     substance abuse  , Hepatitis -  Endo/Other  negative endocrine ROS    Renal/GU negative Renal ROS     Musculoskeletal negative musculoskeletal ROS (+)    Abdominal   Peds  Hematology negative hematology ROS (+)   Anesthesia Other Findings history of pancreatitis  Reproductive/Obstetrics                              Anesthesia Physical Anesthesia Plan  ASA: 2  Anesthesia Plan: MAC   Post-op Pain Management:    Induction: Intravenous  PONV Risk Score and Plan: 1 and Propofol  infusion and Treatment may vary due to age or medical condition  Airway Management Planned: Nasal Cannula  Additional Equipment:   Intra-op Plan:   Post-operative Plan:   Informed Consent: I have reviewed the patients History and Physical, chart, labs and discussed the procedure including the risks, benefits and alternatives for the proposed anesthesia with the patient or authorized representative who has indicated his/her understanding and acceptance.     Dental advisory given  Plan Discussed with: CRNA  Anesthesia Plan Comments:          Anesthesia Quick Evaluation

## 2023-02-03 NOTE — Transfer of Care (Signed)
 Immediate Anesthesia Transfer of Care Note  Patient: JODI KAPPES  Procedure(s) Performed: UPPER ENDOSCOPIC ULTRASOUND (EUS) RADIAL BIOPSY ESOPHAGOGASTRODUODENOSCOPY (EGD)  Patient Location: PACU and Endoscopy Unit  Anesthesia Type:General  Level of Consciousness: awake, alert , oriented, and patient cooperative  Airway & Oxygen Therapy: Patient Spontanous Breathing  Post-op Assessment: Report given to RN and Post -op Vital signs reviewed and stable  Post vital signs: Reviewed and stable  Last Vitals:  Vitals Value Taken Time  BP 121/71 02/03/23 1234  Temp    Pulse 77 02/03/23 1238  Resp 22 02/03/23 1238  SpO2 99 % 02/03/23 1238  Vitals shown include unfiled device data.  Last Pain:  Vitals:   02/03/23 1234  TempSrc:   PainSc: 0-No pain      Patients Stated Pain Goal: 0 (02/03/23 1010)  Complications: No notable events documented.

## 2023-02-03 NOTE — H&P (Signed)
 GASTROENTEROLOGY PROCEDURE H&P NOTE   Primary Care Physician: Kenn Pagan, DO  HPI: Alexander [name] is a 43 y.o. male who presents for EGD/EUS for pancreatic cystgastrostomy creation in setting of recurrent pancreatitis and pseudocyst.  Past Medical History:  Diagnosis Date   Alcohol use disorder    Alcoholic hepatitis    Anemia    Gonorrhea    Hypertension    Pancreatic pseudocyst    Pancreatitis    History reviewed. No pertinent surgical history. Current Facility-Administered Medications  Medication Dose Route Frequency Provider Last Rate Last Admin   0.9 %  sodium chloride  infusion   Intravenous Continuous Mansouraty, Birdena Kingma Jr., MD        Current Facility-Administered Medications:    0.9 %  sodium chloride  infusion, , Intravenous, Continuous, Mansouraty, Aloha Raddle., MD No Known Allergies Family History  Problem Relation Age of Onset   Healthy Mother    Hypertension Father    Social History   Socioeconomic History   Marital status: Single    Spouse name: Not on file   Number of children: Not on file   Years of education: Not on file   Highest education level: Not on file  Occupational History   Not on file  Tobacco Use   Smoking status: Never   Smokeless tobacco: Never  Vaping Use   Vaping status: Every Day   Substances: Nicotine   Substance and Sexual Activity   Alcohol use: Not Currently   Drug use: Never   Sexual activity: Yes    Partners: Male    Comment: follows with health department for descovy   Other Topics Concern   Not on file  Social History Narrative   Not on file   Social Drivers of Health   Financial Resource Strain: Not on file  Food Insecurity: No Food Insecurity (07/16/2022)   Hunger Vital Sign    Worried About Running Out of Food in the Last Year: Never true    Ran Out of Food in the Last Year: Never true  Transportation Needs: No Transportation Needs (07/16/2022)   PRAPARE - Administrator, Civil Service  (Medical): No    Lack of Transportation (Non-Medical): No  Physical Activity: Not on file  Stress: Not on file  Social Connections: Not on file  Intimate Partner Violence: Not At Risk (07/16/2022)   Humiliation, Afraid, Rape, and Kick questionnaire    Fear of Current or Ex-Partner: No    Emotionally Abused: No    Physically Abused: No    Sexually Abused: No    Physical Exam: Today's Vitals   01/23/23 1124 02/03/23 1010  BP:  114/76  Resp:  13  Temp:  97.8 F (36.6 C)  TempSrc:  Temporal  SpO2:  100%  Weight: 64.9 kg 61.2 kg  Height:  5' 7 (1.702 m)  PainSc:  0-No pain   Body mass index is 21.14 kg/m. GEN: NAD EYE: Sclerae anicteric ENT: MMM CV: Non-tachycardic GI: Soft, NT/ND NEURO:  Alert & Oriented x 3  Lab Results: No results for input(s): WBC, HGB, HCT, PLT in the last 72 hours. BMET No results for input(s): NA, K, CL, CO2, GLUCOSE, BUN, CREATININE, CALCIUM  in the last 72 hours. LFT No results for input(s): PROT, ALBUMIN, AST, ALT, ALKPHOS, BILITOT, BILIDIR, IBILI in the last 72 hours. PT/INR No results for input(s): LABPROT, INR in the last 72 hours.   Impression / Plan: This is a 43 y.o.male who presents for EGD/EUS for pancreatic cystgastrostomy creation  in setting of recurrent pancreatitis and pseudocyst.  The risks of an EUS including intestinal perforation, bleeding, infection, aspiration, and medication effects were discussed as was the possibility it may not give a definitive diagnosis if a biopsy is performed.  When a biopsy of the pancreas is done as part of the EUS, there is an additional risk of pancreatitis at the rate of about 1-2%.  It was explained that procedure related pancreatitis is typically mild, although it can be severe and even life threatening, which is why we do not perform random pancreatic biopsies and only biopsy a lesion/area we feel is concerning enough to warrant the risk.  The risks and  benefits of endoscopic evaluation/treatment were discussed with the patient and/or family; these include but are not limited to the risk of perforation, infection, bleeding, missed lesions, lack of diagnosis, severe illness requiring hospitalization, as well as anesthesia and sedation related illnesses.  The patient's history has been reviewed, patient examined, no change in status, and deemed stable for procedure.  The patient and/or family is agreeable to proceed.    Aloha Finner, MD [facility] Office # [phone]

## 2023-02-04 ENCOUNTER — Telehealth: Payer: Self-pay

## 2023-02-04 DIAGNOSIS — K863 Pseudocyst of pancreas: Secondary | ICD-10-CM

## 2023-02-04 NOTE — Telephone Encounter (Signed)
 Patient has a referral in to [facility] for EUS with cyst gastrostomy creation consideration as of  12/13/22. Per Archer Asa at [facility] (phone [phone]) on 01/27/23, referral is still "under review".

## 2023-02-04 NOTE — Anesthesia Postprocedure Evaluation (Signed)
 Anesthesia Post Note  Patient: Alexander [name]  Procedure(s) Performed: UPPER ENDOSCOPIC ULTRASOUND (EUS) RADIAL BIOPSY ESOPHAGOGASTRODUODENOSCOPY (EGD)     Patient location during evaluation: Endoscopy Anesthesia Type: General Level of consciousness: awake Pain management: pain level controlled Vital Signs Assessment: post-procedure vital signs reviewed and stable Respiratory status: spontaneous breathing, nonlabored ventilation and respiratory function stable Cardiovascular status: blood pressure returned to baseline and stable Postop Assessment: no apparent nausea or vomiting Anesthetic complications: no   No notable events documented.  Last Vitals:  Vitals:   02/03/23 1300 02/03/23 1310  BP: 117/73 116/74  Pulse: 77 74  Resp: (!) 21 12  Temp:    SpO2: 99% 100%    Last Pain:  Vitals:   02/03/23 1300  TempSrc:   PainSc: 0-No pain                 Rogelio Winbush P Ryver Zadrozny

## 2023-02-04 NOTE — Telephone Encounter (Signed)
 Dottie I went ahead and ordered the CT scan and sent to the schedulers. He will need a referral out to advanced endoscopy referral to quaternary center if Dr Barron Alvine agrees

## 2023-02-04 NOTE — Telephone Encounter (Signed)
-----   Message from [facility] sent at 02/03/2023  4:04 PM EST ----- Regarding: [facility] patient VC and AC, Please see EUS report. Have concerns about the traversing blood vessel along the entirety of the cyst but also the Doppler flow within the cyst on multiple locations and visualizations. I recommend a pancreas protocol CT abdomen to rule out pseudoaneurysm. Unfortunately based on my availability, repeat attempt at EUS will likely be a few months out, so I recommend any repeat attempt at cystgastrostomy creation, be completed at a quaternary center after the imaging study has been completed. Any of the centers is okay from my end. Thanks. GM  Malcolm Quast and Dottie, Please move forward with scheduling a CT abdomen pancreas protocol. You could place it under my name or Dr. Rennis name. Please follow-up with previous advanced endoscopy referral to quaternary center due to my availability in the coming weeks, for repeat EUS cystgastrostomy creation consideration. Thanks. GM

## 2023-02-05 LAB — SURGICAL PATHOLOGY

## 2023-02-05 NOTE — Telephone Encounter (Signed)
 I have sent an additional referral to [facility] (phone [phone], fax [phone]) to see if patient can be seen for sooner EUS at their facility.

## 2023-02-05 NOTE — Telephone Encounter (Signed)
===  View-only below this line=== ----- Message ----- From: San Sandor GAILS, DO Sent: 02/03/2023   4:32 PM EST To: Alan JONELLE Coombs, PA-C; Naomie LOISE Sharps, RN; * Subject: RE: [facility] patient                             Thanks [facility] much appreciate you getting him in and looping us  in on findings.   Dottie and Patty, please let me know if any traction on getting him in. He already had referral awaiting review at [facility], but depending on timing, may need to get a new referral to either [facility] or [facility].

## 2023-02-06 ENCOUNTER — Encounter (HOSPITAL_COMMUNITY): Payer: Self-pay | Admitting: Gastroenterology

## 2023-02-08 ENCOUNTER — Encounter: Payer: Self-pay | Admitting: Gastroenterology

## 2023-02-10 ENCOUNTER — Other Ambulatory Visit (HOSPITAL_COMMUNITY): Payer: Self-pay

## 2023-02-10 ENCOUNTER — Telehealth: Payer: Self-pay | Admitting: *Deleted

## 2023-02-10 ENCOUNTER — Other Ambulatory Visit: Payer: Self-pay | Admitting: Internal Medicine

## 2023-02-10 ENCOUNTER — Other Ambulatory Visit: Payer: Self-pay

## 2023-02-10 DIAGNOSIS — K5900 Constipation, unspecified: Secondary | ICD-10-CM

## 2023-02-10 NOTE — Telephone Encounter (Signed)
 Call from patient requesting a prescription for Hydroxyzine .  States was on before and was asked by Dr. Kenn if he wanted to continue.  At the time he said no but would like to get it restarted.  Would like to get a hogher dosage if possible.  Would like for refill to go to the [facility].

## 2023-02-11 ENCOUNTER — Other Ambulatory Visit: Payer: Self-pay | Admitting: Internal Medicine

## 2023-02-11 ENCOUNTER — Other Ambulatory Visit: Payer: Self-pay

## 2023-02-11 ENCOUNTER — Other Ambulatory Visit (HOSPITAL_COMMUNITY): Payer: Self-pay

## 2023-02-11 DIAGNOSIS — F419 Anxiety disorder, unspecified: Secondary | ICD-10-CM

## 2023-02-11 MED ORDER — SENNA 8.6 MG PO TABS
1.0000 | ORAL_TABLET | Freq: Every day | ORAL | 0 refills | Status: DC | PRN
  Filled 2023-02-11: qty 100, 100d supply, fill #0

## 2023-02-11 MED ORDER — PANCRELIPASE (LIP-PROT-AMYL) 36000-114000 UNITS PO CPEP
36000.0000 [IU] | ORAL_CAPSULE | Freq: Three times a day (TID) | ORAL | 0 refills | Status: DC
  Filled 2023-02-11: qty 100, 34d supply, fill #0

## 2023-02-11 MED ORDER — HYDROXYZINE HCL 25 MG PO TABS
25.0000 mg | ORAL_TABLET | Freq: Three times a day (TID) | ORAL | 2 refills | Status: DC | PRN
  Filled 2023-02-11: qty 30, 10d supply, fill #0
  Filled 2023-03-09: qty 30, 10d supply, fill #1
  Filled 2023-04-02: qty 30, 10d supply, fill #2

## 2023-02-11 NOTE — Progress Notes (Signed)
 Mr. 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 He underwent endoscopy with Dr. Wilhelmenia on January 6th. This showed pseudocyst that will require stenting. He was referred to tertiary center for further evaluation. He called 1/14 requesting atarax  to help with anxiety. He has taken this medication in the past. P: Start atarax  25 mg TID PRN F/u 02/25/23

## 2023-02-25 ENCOUNTER — Ambulatory Visit: Payer: Medicaid Other | Admitting: Internal Medicine

## 2023-02-25 ENCOUNTER — Other Ambulatory Visit (HOSPITAL_COMMUNITY): Payer: Self-pay

## 2023-02-25 ENCOUNTER — Encounter: Payer: Self-pay | Admitting: Internal Medicine

## 2023-02-25 VITALS — BP 105/63 | HR 77 | Temp 99.1°F | Ht 67.0 in | Wt 147.8 lb

## 2023-02-25 DIAGNOSIS — J3489 Other specified disorders of nose and nasal sinuses: Secondary | ICD-10-CM | POA: Diagnosis present

## 2023-02-25 DIAGNOSIS — D509 Iron deficiency anemia, unspecified: Secondary | ICD-10-CM

## 2023-02-25 DIAGNOSIS — Z8719 Personal history of other diseases of the digestive system: Secondary | ICD-10-CM | POA: Diagnosis not present

## 2023-02-25 DIAGNOSIS — F419 Anxiety disorder, unspecified: Secondary | ICD-10-CM | POA: Insufficient documentation

## 2023-02-25 DIAGNOSIS — G629 Polyneuropathy, unspecified: Secondary | ICD-10-CM | POA: Diagnosis not present

## 2023-02-25 DIAGNOSIS — N529 Male erectile dysfunction, unspecified: Secondary | ICD-10-CM

## 2023-02-25 MED ORDER — TADALAFIL 10 MG PO TABS
10.0000 mg | ORAL_TABLET | ORAL | 1 refills | Status: DC | PRN
  Filled 2023-02-25: qty 20, 20d supply, fill #0
  Filled 2023-04-02: qty 20, 20d supply, fill #1

## 2023-02-25 MED ORDER — FLUTICASONE PROPIONATE 50 MCG/ACT NA SUSP
1.0000 | Freq: Every day | NASAL | 2 refills | Status: DC
  Filled 2023-02-25: qty 16, 30d supply, fill #0
  Filled 2023-04-02: qty 16, 30d supply, fill #1

## 2023-02-25 MED ORDER — GABAPENTIN 300 MG PO CAPS
600.0000 mg | ORAL_CAPSULE | Freq: Three times a day (TID) | ORAL | 3 refills | Status: DC
  Filled 2023-02-25 – 2023-04-02 (×3): qty 270, 45d supply, fill #0
  Filled 2023-05-31: qty 270, 45d supply, fill #1
  Filled 2023-07-01 – 2023-07-31 (×2): qty 270, 45d supply, fill #2
  Filled 2023-08-30 – 2023-09-30 (×3): qty 270, 45d supply, fill #3

## 2023-02-25 MED ORDER — DULOXETINE HCL 30 MG PO CPEP
30.0000 mg | ORAL_CAPSULE | Freq: Every day | ORAL | 3 refills | Status: DC
  Filled 2023-02-25: qty 30, 30d supply, fill #0
  Filled 2023-04-02: qty 30, 30d supply, fill #1

## 2023-02-25 NOTE — Assessment & Plan Note (Addendum)
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 Boone Master at [facility]. He takes Creon 36,000 units TID with meals, on which he reports good appetite and no difficulties with. He recently underwent EGD on 02/03/2023 with Dr. Francie Massing to remove a pseudocyst with mild walled-off necrosis via pancreatic cystgastrostostomy, but the procedure was unable to be completed with concerns for aneurysm. He is to have CT abdomen with pancreatic protocol to rule out aneurysm. He was referred to [facility] with an appointment with Dr. Truitt Merle on 4/1. He continues to be abstinent from alcohol, controlled with gabapentin.

## 2023-02-25 NOTE — Progress Notes (Addendum)
The care of the patient was discussed with Dr. Lafonda Mosses and the assessment and plan was formulated with their assistance.  Please see their note for official documentation of the patient encounter.   Subjective:   Patient ID: Alexander [name] male   DOB: 10/27/1980 43 y.o.   MRN: [id number]  HPI: Alexander [name] is a 43 y.o. male with a PMHx significant for HTN, alcohol-induced pancreatitis, hepatitis, alcohol-associated peripheral neuropathy presenting for anxiety, neuropathy, and an acute 2 day history of sinus pressure and drainage. He localizes the pain to the bridge of his nose and notes some coughing that he attributes to post-nasal drip. He has not experienced fevers during this time. He has tried flonase, antihistamines, and cough syrups with little benefit.   Past Medical History:  Diagnosis Date   Alcohol use disorder    Alcohol withdrawal (HCC) 07/11/2022   Alcoholic hepatitis    Anemia    Gonorrhea    Hypertension    Pancreatic pseudocyst    Pancreatitis    Current Outpatient Medications  Medication Sig Dispense Refill   DULoxetine (CYMBALTA) 30 MG capsule Take 1 capsule (30 mg total) by mouth daily. 30 capsule 3   fluticasone (FLONASE) 50 MCG/ACT nasal spray Place 1 spray into both nostrils daily. 16 g 2   tadalafil (CIALIS) 10 MG tablet Take 1 tablet (10 mg total) by mouth as needed for erectile dysfunction. 20 tablet 1   acetaminophen (TYLENOL) 500 MG tablet Take 1 tablet (500 mg total) by mouth every 6 (six) hours as needed. 30 tablet 0   amLODipine (NORVASC) 5 MG tablet Take 1 tablet (5 mg total) by mouth daily. 90 tablet 11   emtricitabine-tenofovir AF (DESCOVY) 200-25 MG tablet Take 1 tablet by mouth daily. 30 tablet 2   ferrous sulfate 325 (65 FE) MG EC tablet Take 1 tablet (325 mg total) by mouth every other day. 15 tablet 3   gabapentin (NEURONTIN) 300 MG capsule Take 2 capsules (600 mg total) by mouth 3 (three) times daily. 270 capsule 3   hydrOXYzine (ATARAX) 25  MG tablet Take 1 tablet (25 mg total) by mouth 3 (three) times daily as needed for anxiety. 30 tablet 2   lipase/protease/amylase (CREON) 36000 UNITS CPEP capsule Take 1 capsule (36,000 Units total) by mouth 3 (three) times daily with meals. 100 capsule 0   magic mouthwash (lidocaine, diphenhydrAMINE, alum & mag hydroxide) suspension Swish and swallow 10 mLs 3 (three) times daily. 360 mL 1   oxyCODONE (ROXICODONE) 5 MG immediate release tablet Take 1 tablet (5 mg total) by mouth every 6 (six) hours as needed. 18 tablet 0   pantoprazole (PROTONIX) 40 MG tablet Take 1 tablet (40 mg total) by mouth at bedtime. 90 tablet 3   polyethylene glycol (MIRALAX / GLYCOLAX) 17 g packet Mix 1 packet (17 grams) in beverage and take by mouth daily as needed. 14 each 0   prochlorperazine (COMPAZINE) 5 MG tablet Take 1 tablet (5 mg total) by mouth every 8 (eight) hours as needed for up to 3 doses for vomiting. 3 tablet 0   senna (SENOKOT) 8.6 MG TABS tablet Take 1 tablet (8.6 mg total) by mouth daily as needed for mild constipation. 100 tablet 0   No current facility-administered medications for this visit.   Family History  Problem Relation Age of Onset   Healthy Mother    Hypertension Father    Social History   Socioeconomic History   Marital status: Single  Spouse name: Not on file   Number of children: Not on file   Years of education: Not on file   Highest education level: Not on file  Occupational History   Not on file  Tobacco Use   Smoking status: Never   Smokeless tobacco: Never  Vaping Use   Vaping status: Every Day   Substances: Nicotine  Substance and Sexual Activity   Alcohol use: Not Currently   Drug use: Never   Sexual activity: Yes    Partners: Male    Comment: follows with health department for descovy  Other Topics Concern   Not on file  Social History Narrative   Not on file   Social Drivers of Health   Financial Resource Strain: Not on file  Food Insecurity: No Food  Insecurity (07/16/2022)   Hunger Vital Sign    Worried About Running Out of Food in the Last Year: Never true    Ran Out of Food in the Last Year: Never true  Transportation Needs: No Transportation Needs (07/16/2022)   PRAPARE - Administrator, Civil Service (Medical): No    Lack of Transportation (Non-Medical): No  Physical Activity: Not on file  Stress: Not on file  Social Connections: Not on file   Review of Systems: Pertinent items noted in HPI and remainder of comprehensive ROS otherwise negative.  Objective:  Physical Exam: Vitals:   02/25/23 1517  BP: 105/63  Pulse: 77  Temp: 99.1 F (37.3 C)  TempSrc: Oral  SpO2: 100%  Weight: 147 lb 12.8 oz (67 kg)  Height: 5\' 7"  (1.702 m)   BP 105/63 (BP Location: Right Arm, Patient Position: Sitting;Prone)   Pulse 77   Temp 99.1 F (37.3 C) (Oral)   Ht 5\' 7"  (1.702 m)   Wt 147 lb 12.8 oz (67 kg)   SpO2 100%   BMI 23.15 kg/m   Physical Exam: Constitutional: well-appearing, sitting in chair HENT: no effusions to bilateral TM, periorbital hyperpigmentation Cardiovascular: regular rate and rhythm, no m/r/g Pulmonary/Chest: normal work of breathing on room air, lungs clear to auscultation bilaterally MSK: normal bulk and tone Neurological: alert & oriented x 3, normal gait Skin: warm and dry Psych: mood and affect appropriate    Assessment & Plan:   History of acute pancreatitis Hx of alcohol-induced pancreatitis with hospitalization in June 2024, after which he has been following with Dr. Boone Master at [facility]. He takes Creon 36,000 units TID with meals, on which he reports good appetite and no difficulties with. He recently underwent EGD on 02/03/2023 with Dr. Francie Massing to remove a pseudocyst with mild walled-off necrosis via pancreatic cystgastrostostomy, but the procedure was unable to be completed with concerns for aneurysm. He is to have CT abdomen with pancreatic protocol to rule out aneurysm. He was  referred to [facility] with an appointment with Dr. Truitt Merle on 4/1. He continues to be abstinent from alcohol, controlled with gabapentin.   Polyneuropathy Ongoing bilateral lower extremity numbness and tingling, likely in setting of to his heavy alcohol use. His last B12 in 06/2022 was elevated >7500. He reports he had previously supplemented B12 and currently takes a B vitamin at the recommendation of his GI doctors.  He has been taking gabapentin 300 mg TID which controls his alcohol cravings, but has not improved his neuropathy. He has self-titrated up to 600 mg TID with some benefit. Considering his neuropathy plus increasing anxiety, will start Cymbalta.  - Continue gabapentin 600 mg TID - Start  Cymbalta 30 mg - follow-up in 1-2 months  Anxiety Today, Mr. Castell notes increased anxiety since his hospitalization in June 2024 for pancreatitis. GAD 7 elevated at 9.  Recently, he has been taking his hydroxyzine daily due to anxiety. Given his neuropathy and increased anxiety, will begin Cymbalta today. He has declined referral to [facility] today.  - Start Cymbalta 30 mg - follow-up in 1-2 months  Erectile dysfunction Patient notes increased with having and maintaining erections since his hospitalization in June 2024. He does not have morning erections. Because he does not have cardiac risk factors, will prescribe Cialis today, which is covered by his insurance.  - Start Cialis 10 mg PRN  Sinus pressure Patient reports 2 day history of sinus pressure and drainage. He also reports a throat-clearing cough that he attributes to post-nasal drip. He has tried Claritin, flonase, Mucinex, and cough syrup with minimal benefit. He does not report fevers, headaches, or chills. Given the short duration of symptoms and lack of fevers, we will provide supportive care with antihistamines, flonase, and encouraging neti pot use.    Thea Alken, MS3

## 2023-02-25 NOTE — Progress Notes (Deleted)
Subjective:  CC: ***  HPI:  [name] is a 43 y.o. male with a past medical history of history of alcoholic hepatitis with recurrent pancreatitis, anxiety who presents today for sinus congestion and follow-up on chronic conditions.   He underwent endoscopy with GI January 6. He has been referred to [facility] for cyst gastrostomy.  Please see problem based assessment and plan for additional details.  Past Medical History:  Diagnosis Date   Alcohol use disorder    Alcoholic hepatitis    Anemia    Gonorrhea    Hypertension    Pancreatic pseudocyst    Pancreatitis     MEDICATIONS:  Emtricitabine-tenofovir 200-25 mg every day Atarax 25 mg 3 times daily as needed Gabapentin 300 mg 3 times daily Iron supplement Creon 36,000 units 3 times daily with meals Pantoprazole 40 mg daily Amlodipine 5 mg daily  Family History  Problem Relation Age of Onset   Healthy Mother    Hypertension Father     Social History   Socioeconomic History   Marital status: Single    Spouse name: Not on file   Number of children: Not on file   Years of education: Not on file   Highest education level: Not on file  Occupational History   Not on file  Tobacco Use   Smoking status: Never   Smokeless tobacco: Never  Vaping Use   Vaping status: Every Day   Substances: Nicotine  Substance and Sexual Activity   Alcohol use: Not Currently   Drug use: Never   Sexual activity: Yes    Partners: Male    Comment: follows with health department for descovy  Other Topics Concern   Not on file  Social History Narrative   Not on file   Social Drivers of Health   Financial Resource Strain: Not on file  Food Insecurity: No Food Insecurity (07/16/2022)   Hunger Vital Sign    Worried About Running Out of Food in the Last Year: Never true    Ran Out of Food in the Last Year: Never true  Transportation Needs: No Transportation Needs (07/16/2022)   PRAPARE - Scientist, research (physical sciences) (Medical): No    Lack of Transportation (Non-Medical): No  Physical Activity: Not on file  Stress: Not on file  Social Connections: Not on file  Intimate Partner Violence: Not At Risk (07/16/2022)   Humiliation, Afraid, Rape, and Kick questionnaire    Fear of Current or Ex-Partner: No    Emotionally Abused: No    Physically Abused: No    Sexually Abused: No    Review of Systems: ROS negative except for what is noted on the assessment and plan.  Objective:  There were no vitals filed for this visit.  Physical Exam: Constitutional: well-appearing *** sitting in ***, in no acute distress HENT: normocephalic atraumatic, mucous membranes moist Eyes: conjunctiva non-erythematous Neck: supple Cardiovascular: regular rate and rhythm, no m/r/g Pulmonary/Chest: normal work of breathing on room air, lungs clear to auscultation bilaterally Abdominal: soft, non-tender, non-distended MSK: normal bulk and tone Neurological: alert & oriented x 3, 5/5 strength in bilateral upper and lower extremities, normal gait Skin: warm and dry Psych: ***     Assessment & Plan:  No problem-specific Assessment & Plan notes found for this encounter.   Pancreatic pseudocyst EGD by Dr. Irish Lack Roddy on January 6 with concerns for blood vessel surrounding cyst.  Endoscopy notable for pancreatic pseudocyst with mild walled off necrosis.  Blood vessel was noted across the entirety of the cyst cavity was concern for possibility of pseudo aneurysm. Another EGD is needed for possible cystgastrostomy creation.  With his availability he elected to refer to [facility] for cyst gastrostomy.  He is ordered a CT abdomen pancreas protocol to further evaluate vasculature around the cyst. P: CT abdomen pancreas protocol ordered by GI Follow-up with [facility].  Appointment scheduled for April 1. Continue Creon 36,000 units 3 times daily  He has restarted descovy for PrEP?  Patient {[id number]::"discussed  with","seen with"} Dr. {[id number]::"NFAOZHYQ","M. Hoffman","Mullen","Narendra","Machen","Vincent","Guilloud","Lau"}   [employer], D.O. [facility]  PGY-3 Pager: [phone]  Phone: [phone] Date 02/25/2023  Time 8:11 AM

## 2023-02-25 NOTE — Assessment & Plan Note (Addendum)
Today, Alexander [name] notes increased anxiety since his hospitalization in June 2024 for pancreatitis. GAD 7 elevated at 9.  Recently, he has been taking his hydroxyzine daily due to anxiety. Given his neuropathy and increased anxiety, will begin Cymbalta today. He has declined referral to [facility] today.  - Start Cymbalta 30 mg - follow-up in 1-2 months

## 2023-02-25 NOTE — Assessment & Plan Note (Addendum)
Patient notes increased with having and maintaining erections since his hospitalization in June 2024. He does not have morning erections. Because he does not have cardiac risk factors, will prescribe Cialis today, which is covered by his insurance.  - Start Cialis 10 mg PRN

## 2023-02-25 NOTE — Assessment & Plan Note (Signed)
Patient reports 2 day history of sinus pressure and drainage. He also reports a throat-clearing cough that he attributes to post-nasal drip. He has tried Claritin, flonase, Mucinex, and cough syrup with minimal benefit. He does not report fevers, headaches, or chills. Given the short duration of symptoms and lack of fevers, we will provide supportive care with antihistamines, flonase, and encouraging neti pot use.

## 2023-02-25 NOTE — Patient Instructions (Addendum)
Thank you, Mr. RALPH BROUWER for allowing [place] to provide your care today.    For the sinus pressure, unfortunately it is likely that is caused by a viral infection that will take time to resolve. Continue to take the FloNase Claritin or equivalent every day to help dry up your sinuses. You can also use your neti pot, which is effective for washing out that mucus. If the drainage and pressure does not improve in a week, let us know.   For the neuropathy, and anxiety, please start taking Cymbalta 30 mg daily. It may take a few weeks to see benefits from this medication. Continue taking gabapentin 600 mg 2 times daily  We have prescribed you Cialis, which works similarly to Viagra but it does take a bit longer to become effective. Do not take a second dose.   I have ordered the following medication/changed the following medications:   Stop the following medications: There are no discontinued medications.   Start the following medications: No orders of the defined types were placed in this encounter.    Follow up:  1-2 months    We look forward to seeing you next time. Please call our clinic at [phone] if you have any questions or concerns. The best time to call is Monday-Friday from 9am-4pm, but there is someone available 24/7. If after hours or the weekend, call the main hospital number and ask for the Internal Medicine Resident On-Call. If you need medication refills, please notify your pharmacy one week in advance and they will send [place] a request.   Thank you for trusting me with your care. Wishing you the best!   Thea Alken, MS3

## 2023-02-25 NOTE — Assessment & Plan Note (Addendum)
Ongoing bilateral lower extremity numbness and tingling, likely in setting of to his heavy alcohol use. His last B12 in 06/2022 was elevated >7500. He reports he had previously supplemented B12 and currently takes a B vitamin at the recommendation of his GI doctors.  He has been taking gabapentin 300 mg TID which controls his alcohol cravings, but has not improved his neuropathy. He has self-titrated up to 600 mg TID with some benefit. Considering his neuropathy plus increasing anxiety, will start Cymbalta.  - Continue gabapentin 600 mg TID - Start Cymbalta 30 mg - follow-up in 1-2 months

## 2023-02-26 NOTE — Progress Notes (Signed)
Internal Medicine Clinic Attending  Case discussed with the resident at the time of the visit.  We reviewed the resident's history and exam and pertinent patient test results.  I agree with the assessment, diagnosis, and plan of care documented in the resident's note.

## 2023-03-09 ENCOUNTER — Other Ambulatory Visit: Payer: Self-pay

## 2023-03-10 ENCOUNTER — Other Ambulatory Visit (HOSPITAL_COMMUNITY): Payer: Self-pay

## 2023-03-11 ENCOUNTER — Other Ambulatory Visit: Payer: Self-pay

## 2023-03-11 ENCOUNTER — Other Ambulatory Visit (HOSPITAL_COMMUNITY): Payer: Self-pay

## 2023-03-12 ENCOUNTER — Other Ambulatory Visit (HOSPITAL_COMMUNITY): Payer: Self-pay

## 2023-03-19 ENCOUNTER — Other Ambulatory Visit (HOSPITAL_COMMUNITY): Payer: Self-pay

## 2023-03-20 ENCOUNTER — Other Ambulatory Visit (HOSPITAL_COMMUNITY): Payer: Self-pay

## 2023-04-02 ENCOUNTER — Other Ambulatory Visit (HOSPITAL_COMMUNITY): Payer: Self-pay

## 2023-04-07 ENCOUNTER — Ambulatory Visit: Payer: Medicaid Other | Admitting: Internal Medicine

## 2023-04-07 ENCOUNTER — Other Ambulatory Visit (HOSPITAL_COMMUNITY): Payer: Self-pay

## 2023-04-07 ENCOUNTER — Other Ambulatory Visit: Payer: Self-pay

## 2023-04-07 VITALS — BP 104/61 | HR 89 | Temp 97.9°F | Ht 67.0 in | Wt 148.2 lb

## 2023-04-07 DIAGNOSIS — F419 Anxiety disorder, unspecified: Secondary | ICD-10-CM

## 2023-04-07 DIAGNOSIS — N529 Male erectile dysfunction, unspecified: Secondary | ICD-10-CM | POA: Diagnosis not present

## 2023-04-07 DIAGNOSIS — J3489 Other specified disorders of nose and nasal sinuses: Secondary | ICD-10-CM

## 2023-04-07 DIAGNOSIS — G629 Polyneuropathy, unspecified: Secondary | ICD-10-CM

## 2023-04-07 MED ORDER — HYDROXYZINE HCL 25 MG PO TABS
25.0000 mg | ORAL_TABLET | Freq: Three times a day (TID) | ORAL | 0 refills | Status: DC | PRN
  Filled 2023-04-07 – 2023-04-10 (×3): qty 30, 10d supply, fill #0

## 2023-04-07 MED ORDER — DULOXETINE HCL 60 MG PO CPEP
60.0000 mg | ORAL_CAPSULE | Freq: Every day | ORAL | 2 refills | Status: DC
Start: 2023-04-07 — End: 2023-05-06
  Filled 2023-04-07: qty 30, 30d supply, fill #0

## 2023-04-07 MED ORDER — TADALAFIL 20 MG PO TABS
20.0000 mg | ORAL_TABLET | Freq: Every day | ORAL | 0 refills | Status: DC | PRN
Start: 2023-04-07 — End: 2023-05-06
  Filled 2023-04-07: qty 15, 15d supply, fill #0

## 2023-04-07 MED ORDER — FLUTICASONE PROPIONATE 50 MCG/ACT NA SUSP
1.0000 | Freq: Every day | NASAL | 0 refills | Status: DC
  Filled 2023-04-07: qty 9.9, fill #0
  Filled 2023-05-06: qty 16, 30d supply, fill #0

## 2023-04-07 NOTE — Progress Notes (Unsigned)
 Subjective:  CC: anxiety  HPI:  Alexander [name] is a 43 y.o. male with a past medical history of hypertension, alcohol induced pancreatitis, alcohol associated peripheral neuropathy who presents today for follow-up on anxiety and erectile dysfunction.   He was last seen in January at which time he was started on Cymbalta in setting of polyneuropathy and anxiety. He was also prescribed cialis due to erectile dysfunction.  EGD with Dr. Hamilton Capri in January showed a pseudocyst with walled off necrosis.  He has a CT abdomen with contrast ordered bu GI to help rule out aneurysm prior to pursuing further surgical intervention.  He has follow-up with [facility] with GI 04/29/23.    Please see problem based assessment and plan for additional details.  Past Medical History:  Diagnosis Date   Alcohol use disorder    Alcohol withdrawal (HCC) 07/11/2022   Alcoholic hepatitis    Anemia    Gonorrhea    Hypertension    Pancreatic pseudocyst    Pancreatitis     MEDICATIONS:  Descovy Cialis 10 mg PRN Sildenafil 10 mg as needed Duloxetine 30 mg daily Iron supplement Creon 36,000 3 times daily with meals Pantoprazole 40 mg Amlodipine 5 mg daily Oxycodone 5 mg every 6 as needed Gabapentin 300 mg 3 times daily   Family History  Problem Relation Age of Onset   Healthy Mother    Hypertension Father     Past Surgical History:  Procedure Laterality Date   BIOPSY  02/03/2023   Procedure: BIOPSY;  Surgeon: Meridee Score, Netty Starring., MD;  Location: [facility];  Service: Gastroenterology;;   ESOPHAGOGASTRODUODENOSCOPY N/A 02/03/2023   Procedure: ESOPHAGOGASTRODUODENOSCOPY (EGD);  Surgeon: Lemar Lofty., MD;  Location: [facility];  Service: Gastroenterology;  Laterality: N/A;   EUS N/A 02/03/2023   Procedure: UPPER ENDOSCOPIC ULTRASOUND (EUS) RADIAL;  Surgeon: Lemar Lofty., MD;  Location: [facility];  Service: Gastroenterology;  Laterality: N/A;     Social History    Socioeconomic History   Marital status: Single    Spouse name: Not on file   Number of children: Not on file   Years of education: Not on file   Highest education level: Not on file  Occupational History   Not on file  Tobacco Use   Smoking status: Never   Smokeless tobacco: Never  Vaping Use   Vaping status: Every Day   Substances: Nicotine  Substance and Sexual Activity   Alcohol use: Not Currently   Drug use: Never   Sexual activity: Yes    Partners: Male    Comment: follows with health department for descovy  Other Topics Concern   Not on file  Social History Narrative   Not on file   Social Drivers of Health   Financial Resource Strain: Not on file  Food Insecurity: No Food Insecurity (07/16/2022)   Hunger Vital Sign    Worried About Running Out of Food in the Last Year: Never true    Ran Out of Food in the Last Year: Never true  Transportation Needs: No Transportation Needs (07/16/2022)   PRAPARE - Administrator, Civil Service (Medical): No    Lack of Transportation (Non-Medical): No  Physical Activity: Not on file  Stress: Not on file  Social Connections: Not on file  Intimate Partner Violence: Not At Risk (07/16/2022)   Humiliation, Afraid, Rape, and Kick questionnaire    Fear of Current or Ex-Partner: No    Emotionally Abused: No  Physically Abused: No    Sexually Abused: No    Review of Systems: ROS negative except for what is noted on the assessment and plan.  Objective:   Vitals:   04/07/23 1539  BP: 104/61  Pulse: 89  Temp: 97.9 F (36.6 C)  TempSrc: Oral  SpO2: 100%  Weight: 148 lb 3.2 oz (67.2 kg)  Height: 5\' 7"  (1.702 m)    Physical Exam: Constitutional: well-appearing, in no acute distress Cardiovascular: regular rate and rhythm, no m/r/g Pulmonary/Chest: normal work of breathing on room air, lungs clear to auscultation bilaterally Abdominal: soft, non-tender, non-distended MSK: normal bulk and tone Skin: warm and  dry  Assessment & Plan:  Anxiety Seen in January and noted significant anxiety.  He had been taking hydroxyzine multiple times to help with this.  His GAD score elevated at 9 in January.  He was started on duloxetine 30 mg to help with anxiety and polyneuropathy.  He continues to take hydroxyzine 1-2 times daily. His neuropathic pain has improved greatly on duloxetine.  He continues to take gabapentin.  He was concerned last week as he missed 2 days of duloxetine and he feels like his neuropathy pain rebounded.  Since then he restarted the duloxetine and it has helped significantly.  He has not noticed an improvement in anxiety with starting duloxetine. P: Increase duloxetine from 30 mg to 60 mg We talked about importance of tapering medication if he decides to stop medication Repeat GAD at follow-up in 4 weeks  Erectile dysfunction He was started on cialis at appointment in January. He has noticed that he is now able to have erections but they are not sustained.  He is interested in trying a higher dose of Cialis. He was started on a SNRI at last office visit.  P: Increase cialis from 10 mg to 20 mg PRN I talked with him about balance of treating anxiety and ED. SNRI could be making ED worse but it sounds like anxiety is significant enough to impact function and he would like this treated as well. He will follow-up in 4 weeks. Plan to continue conversations about balance of treating anxiety and ED.   Patient discussed with Dr. Precious Bard Jamielee Mchale, D.O. [facility] Internal [facility]  PGY-3 Pager: [phone]  Phone: [phone] Date 04/08/2023  Time 9:02 AM

## 2023-04-07 NOTE — Patient Instructions (Addendum)
 Thank you, Alexander [name] for allowing [place] to provide your care today.   Anxiety/ neuropathy I increased cymbalta from 30 mg to 60 mg.  Try that over the next couple weeks and see if this helps with your anxiety. The goal is to control anxiety so that you do not feel like you need to take hydroxyzine daily.  Erectile dysfunction I am increasing the dose of Cialis from 10 mg to 20 mg. Cymbalta and hydroxyzine could also be making erectile dysfunction worse.  As a balance as we need to treat anxiety, but I also do not want to be giving medications that make your erectile dysfunction worse.  If the increased dose of Cialis is not helpful then come back in a month.  I have ordered the following medication/changed the following medications:   Stop the following medications: Medications Discontinued During This Encounter  Medication Reason   fluticasone (FLONASE) 50 MCG/ACT nasal spray Reorder   DULoxetine (CYMBALTA) 30 MG capsule      Start the following medications: Meds ordered this encounter  Medications   fluticasone (FLONASE) 50 MCG/ACT nasal spray    Sig: Place 1 spray into both nostrils daily.    Dispense:  9.9 mL    Refill:  06   DULoxetine (CYMBALTA) 60 MG capsule    Sig: Take 1 capsule (60 mg total) by mouth daily.    Dispense:  30 capsule    Refill:  2     Follow up: 1 month   We look forward to seeing you next time. Please call our clinic at [phone] if you have any questions or concerns. The best time to call is Monday-Friday from 9am-4pm, but there is someone available 24/7. If after hours or the weekend, call the main hospital number and ask for the Internal Medicine Resident On-Call. If you need medication refills, please notify your pharmacy one week in advance and they will send [place] a request.   Thank you for trusting me with your care. Wishing you the best!   Rudene Christians, DO [facility]

## 2023-04-08 ENCOUNTER — Other Ambulatory Visit (HOSPITAL_COMMUNITY): Payer: Self-pay

## 2023-04-08 NOTE — Assessment & Plan Note (Signed)
 Seen in January and noted significant anxiety.  He had been taking hydroxyzine multiple times to help with this.  His GAD score elevated at 9 in January.  He was started on duloxetine 30 mg to help with anxiety and polyneuropathy.  He continues to take hydroxyzine 1-2 times daily. His neuropathic pain has improved greatly on duloxetine.  He continues to take gabapentin.  He was concerned last week as he missed 2 days of duloxetine and he feels like his neuropathy pain rebounded.  Since then he restarted the duloxetine and it has helped significantly.  He has not noticed an improvement in anxiety with starting duloxetine. P: Increase duloxetine from 30 mg to 60 mg We talked about importance of tapering medication if he decides to stop medication Repeat GAD at follow-up in 4 weeks

## 2023-04-08 NOTE — Assessment & Plan Note (Signed)
 He was started on cialis at appointment in January. He has noticed that he is now able to have erections but they are not sustained.  He is interested in trying a higher dose of Cialis. He was started on a SNRI at last office visit.  P: Increase cialis from 10 mg to 20 mg PRN I talked with him about balance of treating anxiety and ED. SNRI could be making ED worse but it sounds like anxiety is significant enough to impact function and he would like this treated as well. He will follow-up in 4 weeks. Plan to continue conversations about balance of treating anxiety and ED.

## 2023-04-08 NOTE — Progress Notes (Signed)
 Internal Medicine Clinic Attending  Case discussed with the resident at the time of the visit.  We reviewed the resident's history and exam and pertinent patient test results.  I agree with the assessment, diagnosis, and plan of care documented in the resident's note.

## 2023-04-10 ENCOUNTER — Other Ambulatory Visit (HOSPITAL_COMMUNITY): Payer: Self-pay

## 2023-05-06 ENCOUNTER — Other Ambulatory Visit (HOSPITAL_COMMUNITY): Payer: Self-pay

## 2023-05-06 ENCOUNTER — Other Ambulatory Visit: Payer: Self-pay | Admitting: Internal Medicine

## 2023-05-06 ENCOUNTER — Ambulatory Visit: Admitting: Student

## 2023-05-06 ENCOUNTER — Encounter: Payer: Self-pay | Admitting: Student

## 2023-05-06 VITALS — BP 114/72 | HR 80 | Temp 98.2°F | Ht 67.0 in | Wt 164.5 lb

## 2023-05-06 DIAGNOSIS — G629 Polyneuropathy, unspecified: Secondary | ICD-10-CM | POA: Diagnosis not present

## 2023-05-06 DIAGNOSIS — N529 Male erectile dysfunction, unspecified: Secondary | ICD-10-CM

## 2023-05-06 DIAGNOSIS — K862 Cyst of pancreas: Secondary | ICD-10-CM

## 2023-05-06 DIAGNOSIS — F419 Anxiety disorder, unspecified: Secondary | ICD-10-CM

## 2023-05-06 MED ORDER — PANCRELIPASE (LIP-PROT-AMYL) 36000-114000 UNITS PO CPEP
36000.0000 [IU] | ORAL_CAPSULE | Freq: Three times a day (TID) | ORAL | 0 refills | Status: DC
  Filled 2023-05-06: qty 100, 34d supply, fill #0

## 2023-05-06 MED ORDER — DULOXETINE HCL 60 MG PO CPEP
60.0000 mg | ORAL_CAPSULE | Freq: Every day | ORAL | 2 refills | Status: DC
Start: 2023-05-06 — End: 2023-07-31
  Filled 2023-05-06: qty 30, 30d supply, fill #0
  Filled 2023-05-31: qty 30, 30d supply, fill #1
  Filled 2023-07-01 (×2): qty 30, 30d supply, fill #2

## 2023-05-06 MED ORDER — TADALAFIL 20 MG PO TABS
20.0000 mg | ORAL_TABLET | Freq: Every day | ORAL | 2 refills | Status: DC | PRN
  Filled 2023-05-06: qty 30, 30d supply, fill #0
  Filled 2023-05-31: qty 30, 30d supply, fill #1
  Filled 2023-07-01 (×2): qty 30, 30d supply, fill #2

## 2023-05-06 MED ORDER — HYDROXYZINE HCL 25 MG PO TABS
25.0000 mg | ORAL_TABLET | Freq: Three times a day (TID) | ORAL | 2 refills | Status: DC | PRN
  Filled 2023-05-06: qty 90, 30d supply, fill #0
  Filled 2023-05-31: qty 90, 30d supply, fill #1
  Filled 2023-07-01 (×2): qty 90, 30d supply, fill #2

## 2023-05-06 NOTE — Assessment & Plan Note (Signed)
 Patient has a history of alcohol induced pancreatitis and hepatitis.  He has CT imaging concerning for pseudocyst.  This has been being followed since August 2024.  Pseudocyst was enlarging in September as well as October.  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  Patient was referred to [facility] for further evaluation and management.  Awaiting repeat CT imaging to see what plan will be moving forward.  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 He said that he needs to schedule the CT scan. I gave him the number to his GI doctor to make sure that he can get the scan scheduled.  Plan: -Continue to follow with GI  -Await CT results

## 2023-05-06 NOTE — Patient Instructions (Addendum)
 Alexander [name], Thank you for allowing me to take part in your care today.  Here are your instructions.  1. I have refilled your Cialis, duloxetine, and your atarax.   2. Please come back in 3 months for a follow up.  3. I am glad to see that you are doing well.   4. Please call your GI doctor to see if you can get that CT scanned scheduled  [phone]  Thank you, Dr. Allena Katz  If you have any other questions please contact the internal medicine clinic at [phone] If it is after hours, please call the [place] hospital at [phone] and then ask the person who picks up for the resident on call.

## 2023-05-06 NOTE — Assessment & Plan Note (Signed)
 Patient presents today for follow up on his anxiety. He was seen about 4 weeks ago and was increased on duloxetine to 60 mg. He has since had much improvement in his anxiety. His GAD 7 score has come down to 8. He states that he uses his atarax daily. I told him that he can stop taking the atarax daily if he does not need this and he understood this. I am glad to see that he is doing good.   Plan: -Continue Duloxetine 60 mg daily  -Continue Atarax 25 mg TID PRN  -Follow up in 3 months

## 2023-05-06 NOTE — Assessment & Plan Note (Signed)
 Patient endorses that the Duloxetine is improving his polyneuropathy. He states that the it is tolerable today.  Plan: - Continue gabapentin 600 mg TID - Continue Cymbalta 60 mg - follow-up in 3 months

## 2023-05-06 NOTE — Assessment & Plan Note (Signed)
 4 week follow up for ED. He states that increasing the Cialis to 20 mg prn helped him. He reports no concerns with this.  Plan: -Continue Cilais 20 mg prn

## 2023-05-06 NOTE — Progress Notes (Signed)
 CC: Anxiety and Erectile Dysfunction   HPI:  [name] is a 43 y.o. male with past medical history of hypertension, history of pancreatitis, hepatic vein thrombosis, anxiety who presents for follow-up appointment. Please see assessment and plan for HPI.   Medications: Pain: Tylenol 500 mg every 6 hours as needed, Cymbalta 60 mg daily, gabapentin 600 mg 3 times daily, oxycodone 5 mg every 6 hours as needed Hypertension: Amlodipine 5 mg daily Descovy? Iron deficiency: 325 mg every other day Allergic rhinitis: Flonase Chronic pancreatitis: Creon 36,000 units 3 times daily GERD: Protonix 40 mg daily Constipation: MiraLAX 17 g daily as needed, senna Nausea: Compazine 5 mg every 8 hours as needed ED: Cialis 20 mg daily.   Past Medical History:  Diagnosis Date   Alcohol use disorder    Alcohol withdrawal (HCC) 07/11/2022   Alcoholic hepatitis    Anemia    Gonorrhea    Hypertension    Pancreatic pseudocyst    Pancreatitis      Current Outpatient Medications:    acetaminophen (TYLENOL) 500 MG tablet, Take 1 tablet (500 mg total) by mouth every 6 (six) hours as needed., Disp: 30 tablet, Rfl: 0   amLODipine (NORVASC) 5 MG tablet, Take 1 tablet (5 mg total) by mouth daily., Disp: 90 tablet, Rfl: 11   DULoxetine (CYMBALTA) 60 MG capsule, Take 1 capsule (60 mg total) by mouth daily., Disp: 30 capsule, Rfl: 2   emtricitabine-tenofovir AF (DESCOVY) 200-25 MG tablet, Take 1 tablet by mouth daily., Disp: 30 tablet, Rfl: 2   ferrous sulfate 325 (65 FE) MG EC tablet, Take 1 tablet (325 mg total) by mouth every other day., Disp: 15 tablet, Rfl: 3   fluticasone (FLONASE) 50 MCG/ACT nasal spray, Place 1 spray into both nostrils daily., Disp: 16 g, Rfl: 0   gabapentin (NEURONTIN) 300 MG capsule, Take 2 capsules (600 mg total) by mouth 3 (three) times daily., Disp: 270 capsule, Rfl: 3   hydrOXYzine (ATARAX) 25 MG tablet, Take 1 tablet (25 mg total) by mouth 3 (three) times daily as needed  for anxiety., Disp: 90 tablet, Rfl: 2   lipase/protease/amylase (CREON) 36000 UNITS CPEP capsule, Take 1 capsule (36,000 Units total) by mouth 3 (three) times daily with meals., Disp: 100 capsule, Rfl: 0   magic mouthwash (lidocaine, diphenhydrAMINE, alum & mag hydroxide) suspension, Swish and swallow 10 mLs 3 (three) times daily., Disp: 360 mL, Rfl: 1   oxyCODONE (ROXICODONE) 5 MG immediate release tablet, Take 1 tablet (5 mg total) by mouth every 6 (six) hours as needed., Disp: 18 tablet, Rfl: 0   pantoprazole (PROTONIX) 40 MG tablet, Take 1 tablet (40 mg total) by mouth at bedtime., Disp: 90 tablet, Rfl: 3   polyethylene glycol (MIRALAX / GLYCOLAX) 17 g packet, Mix 1 packet (17 grams) in beverage and take by mouth daily as needed., Disp: 14 each, Rfl: 0   prochlorperazine (COMPAZINE) 5 MG tablet, Take 1 tablet (5 mg total) by mouth every 8 (eight) hours as needed for up to 3 doses for vomiting., Disp: 3 tablet, Rfl: 0   senna (SENOKOT) 8.6 MG TABS tablet, Take 1 tablet (8.6 mg total) by mouth daily as needed for mild constipation., Disp: 100 tablet, Rfl: 0   tadalafil (CIALIS) 20 MG tablet, Take 1 tablet (20 mg total) by mouth daily as needed for erectile dysfunction., Disp: 30 tablet, Rfl: 2  Review of Systems:    Negative except what is in HPI  Physical Exam:  Vitals:  05/06/23 1532  BP: 114/72  Pulse: 80  Temp: 98.2 F (36.8 C)  TempSrc: Oral  SpO2: 100%  Weight: 164 lb 8 oz (74.6 kg)  Height: 5\' 7"  (1.702 m)   General: Patient is sitting comfortably in the room  Head: Normocephalic, atraumatic  Cardio: Regular rate and rhythm, no murmurs, rubs or gallops Pulmonary: Clear to ausculation bilaterally with no rales, rhonchi, and crackles    Assessment & Plan:   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  He has CT imaging concerning for pseudocyst.  This has been being followed since August 2024.  Pseudocyst was enlarging in September  as well as October.  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  Patient was referred to [facility] for further evaluation and management.  Awaiting repeat CT imaging to see what plan will be moving forward.  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 He said that he needs to schedule the CT scan. I gave him the number to his GI doctor to make sure that he can get the scan scheduled.  Plan: -Continue to follow with GI  -Await CT results   Polyneuropathy Patient endorses that the Duloxetine is improving his polyneuropathy. He states that the it is tolerable today.  Plan: - Continue gabapentin 600 mg TID - Continue Cymbalta 60 mg - follow-up in 3 months   Anxiety Patient presents today for follow up on his anxiety. He was seen about 4 weeks ago and was increased on duloxetine to 60 mg. He has since had much improvement in his anxiety. His GAD 7 score has come down to 8. He states that he uses his atarax daily. I told him that he can stop taking the atarax daily if he does not need this and he understood this. I am glad to see that he is doing good.   Plan: -Continue Duloxetine 60 mg daily  -Continue Atarax 25 mg TID PRN  -Follow up in 3 months   Erectile dysfunction 4 week follow up for ED. He states that increasing the Cialis to 20 mg prn helped him. He reports no concerns with this.  Plan: -Continue Cilais 20 mg prn   Patient discussed with Dr.  Milagros Evener, DO PGY-2 Internal Medicine Resident

## 2023-05-07 ENCOUNTER — Other Ambulatory Visit: Payer: Self-pay

## 2023-05-09 ENCOUNTER — Other Ambulatory Visit (HOSPITAL_COMMUNITY): Payer: Self-pay

## 2023-05-13 NOTE — Progress Notes (Signed)
 Internal Medicine Clinic Attending  Case discussed with the resident at the time of the visit.  We reviewed the resident's history and exam and pertinent patient test results.  I agree with the assessment, diagnosis, and plan of care documented in the resident's note.

## 2023-05-16 ENCOUNTER — Other Ambulatory Visit (HOSPITAL_COMMUNITY): Payer: Self-pay

## 2023-05-26 ENCOUNTER — Emergency Department (HOSPITAL_COMMUNITY)

## 2023-05-26 ENCOUNTER — Other Ambulatory Visit: Payer: Self-pay

## 2023-05-26 ENCOUNTER — Encounter (HOSPITAL_COMMUNITY): Payer: Self-pay

## 2023-05-26 ENCOUNTER — Emergency Department (HOSPITAL_COMMUNITY)
Admission: EM | Admit: 2023-05-26 | Discharge: 2023-05-26 | Disposition: A | Discharge status: Home / Self Care | Attending: Emergency Medicine | Admitting: Emergency Medicine

## 2023-05-26 DIAGNOSIS — R1030 Lower abdominal pain, unspecified: Secondary | ICD-10-CM | POA: Insufficient documentation

## 2023-05-26 DIAGNOSIS — R09A0 Foreign body sensation, unspecified: Secondary | ICD-10-CM

## 2023-05-26 DIAGNOSIS — R110 Nausea: Secondary | ICD-10-CM | POA: Diagnosis not present

## 2023-05-26 NOTE — ED Notes (Signed)
 Patient declining IV at this time. MD notified.

## 2023-05-26 NOTE — ED Provider Notes (Signed)
 Received patient in turnover from Dr. Wallis Gun.  Please see their note for further details of Hx, PE.  Briefly patient is a 43 y.o. male with a Abdominal Pain .  Patient is concerned that he has a rectal foreign body.  Awaiting CT imaging.  Very shortly after the patient returned from CT he got dressed and left without me being able to talk with him.  He was able to sign AGAINST MEDICAL ADVICE paperwork with nursing staff.    Albertus Hughs, DO 05/26/23 [phone]

## 2023-05-26 NOTE — ED Notes (Signed)
 Pt found dressing determined to leave. Inga Manges DO made aware via secure chat. AMA paper work reviewed with pt, pt still wished to leave, paper work Statistician.
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
## 2023-05-31 ENCOUNTER — Other Ambulatory Visit: Payer: Self-pay | Admitting: Student

## 2023-05-31 ENCOUNTER — Other Ambulatory Visit: Payer: Self-pay | Admitting: Internal Medicine

## 2023-05-31 DIAGNOSIS — J3489 Other specified disorders of nose and nasal sinuses: Secondary | ICD-10-CM

## 2023-06-02 ENCOUNTER — Other Ambulatory Visit: Payer: Self-pay

## 2023-06-02 ENCOUNTER — Other Ambulatory Visit (HOSPITAL_COMMUNITY): Payer: Self-pay

## 2023-06-02 MED ORDER — FLUTICASONE PROPIONATE 50 MCG/ACT NA SUSP
1.0000 | Freq: Every day | NASAL | 0 refills | Status: DC
  Filled 2023-06-02: qty 16, 30d supply, fill #0

## 2023-06-02 MED ORDER — PANCRELIPASE (LIP-PROT-AMYL) 36000-114000 UNITS PO CPEP
36000.0000 [IU] | ORAL_CAPSULE | Freq: Three times a day (TID) | ORAL | 0 refills | Status: DC
  Filled 2023-06-02: qty 100, 34d supply, fill #0

## 2023-06-03 ENCOUNTER — Other Ambulatory Visit (HOSPITAL_COMMUNITY): Payer: Self-pay

## 2023-06-04 ENCOUNTER — Other Ambulatory Visit (HOSPITAL_COMMUNITY): Payer: Self-pay

## 2023-06-04 ENCOUNTER — Other Ambulatory Visit: Payer: Self-pay

## 2023-06-12 ENCOUNTER — Other Ambulatory Visit (HOSPITAL_COMMUNITY): Payer: Self-pay

## 2023-06-16 ENCOUNTER — Other Ambulatory Visit (HOSPITAL_COMMUNITY): Payer: Self-pay

## 2023-07-01 ENCOUNTER — Other Ambulatory Visit (HOSPITAL_COMMUNITY): Payer: Self-pay

## 2023-07-01 ENCOUNTER — Other Ambulatory Visit: Payer: Self-pay | Admitting: Student

## 2023-07-01 DIAGNOSIS — J3489 Other specified disorders of nose and nasal sinuses: Secondary | ICD-10-CM

## 2023-07-02 ENCOUNTER — Other Ambulatory Visit: Payer: Self-pay

## 2023-07-02 ENCOUNTER — Other Ambulatory Visit (HOSPITAL_COMMUNITY): Payer: Self-pay

## 2023-07-02 MED ORDER — PANCRELIPASE (LIP-PROT-AMYL) 36000-114000 UNITS PO CPEP
36000.0000 [IU] | ORAL_CAPSULE | Freq: Three times a day (TID) | ORAL | 3 refills | Status: DC
  Filled 2023-07-02: qty 100, 34d supply, fill #0
  Filled 2023-07-31: qty 100, 34d supply, fill #1
  Filled 2023-08-30: qty 100, 34d supply, fill #2
  Filled 2023-10-15: qty 100, 34d supply, fill #3

## 2023-07-02 MED ORDER — FLUTICASONE PROPIONATE 50 MCG/ACT NA SUSP
1.0000 | Freq: Every day | NASAL | 0 refills | Status: DC
  Filled 2023-07-02: qty 16, 30d supply, fill #0

## 2023-07-10 ENCOUNTER — Telehealth: Payer: Self-pay | Admitting: *Deleted

## 2023-07-10 NOTE — Telephone Encounter (Signed)
 Copied from CRM [id number]. Topic: Clinical - Medication Question >> Jul 10, 2023  1:21 PM Madelyne Schiff wrote: Patient Kloepfer would like a call back from Dr Jannice Mends regarding Medications.  (Declined to make an appt or state which medications)    Please advise

## 2023-07-10 NOTE — Telephone Encounter (Signed)
 Called pt - stated he preferred to talk to Dr Jannice Mends directly, not to the triage nurse.

## 2023-07-16 ENCOUNTER — Encounter: Payer: Self-pay | Admitting: *Deleted

## 2023-07-31 ENCOUNTER — Other Ambulatory Visit (HOSPITAL_COMMUNITY): Payer: Self-pay

## 2023-07-31 ENCOUNTER — Other Ambulatory Visit: Payer: Self-pay | Admitting: Student

## 2023-07-31 ENCOUNTER — Other Ambulatory Visit: Payer: Self-pay

## 2023-07-31 DIAGNOSIS — G629 Polyneuropathy, unspecified: Secondary | ICD-10-CM

## 2023-07-31 DIAGNOSIS — F419 Anxiety disorder, unspecified: Secondary | ICD-10-CM

## 2023-07-31 DIAGNOSIS — N529 Male erectile dysfunction, unspecified: Secondary | ICD-10-CM

## 2023-07-31 DIAGNOSIS — K859 Acute pancreatitis without necrosis or infection, unspecified: Secondary | ICD-10-CM

## 2023-08-04 ENCOUNTER — Other Ambulatory Visit: Payer: Self-pay

## 2023-08-04 ENCOUNTER — Other Ambulatory Visit (HOSPITAL_COMMUNITY): Payer: Self-pay

## 2023-08-04 ENCOUNTER — Encounter (HOSPITAL_COMMUNITY): Payer: Self-pay

## 2023-08-04 MED ORDER — DULOXETINE HCL 60 MG PO CPEP
60.0000 mg | ORAL_CAPSULE | Freq: Every day | ORAL | 2 refills | Status: DC
  Filled 2023-08-04: qty 30, 30d supply, fill #0
  Filled 2023-08-30: qty 30, 30d supply, fill #1
  Filled 2023-09-30: qty 30, 30d supply, fill #2

## 2023-08-04 MED ORDER — TADALAFIL 20 MG PO TABS
20.0000 mg | ORAL_TABLET | Freq: Every day | ORAL | 2 refills | Status: DC | PRN
  Filled 2023-08-04: qty 30, 30d supply, fill #0
  Filled 2023-08-30: qty 30, 30d supply, fill #1
  Filled 2023-09-30: qty 30, 30d supply, fill #2

## 2023-08-04 MED ORDER — HYDROXYZINE HCL 25 MG PO TABS
25.0000 mg | ORAL_TABLET | Freq: Three times a day (TID) | ORAL | 2 refills | Status: DC | PRN
  Filled 2023-08-04: qty 90, 30d supply, fill #0
  Filled 2023-08-30: qty 90, 30d supply, fill #1
  Filled 2023-10-15: qty 90, 30d supply, fill #2

## 2023-08-07 ENCOUNTER — Other Ambulatory Visit (HOSPITAL_COMMUNITY): Payer: Self-pay

## 2023-08-30 ENCOUNTER — Other Ambulatory Visit: Payer: Self-pay

## 2023-08-30 ENCOUNTER — Other Ambulatory Visit (HOSPITAL_COMMUNITY): Payer: Self-pay

## 2023-08-30 ENCOUNTER — Other Ambulatory Visit: Payer: Self-pay | Admitting: Student

## 2023-08-30 DIAGNOSIS — K859 Acute pancreatitis without necrosis or infection, unspecified: Secondary | ICD-10-CM

## 2023-08-31 ENCOUNTER — Other Ambulatory Visit (HOSPITAL_COMMUNITY): Payer: Self-pay

## 2023-09-01 ENCOUNTER — Ambulatory Visit: Admitting: Student

## 2023-09-01 ENCOUNTER — Other Ambulatory Visit (HOSPITAL_COMMUNITY): Payer: Self-pay

## 2023-09-01 ENCOUNTER — Other Ambulatory Visit: Payer: Self-pay

## 2023-09-01 ENCOUNTER — Ambulatory Visit: Payer: Self-pay | Admitting: Student

## 2023-09-01 VITALS — BP 139/77 | HR 96 | Wt 164.0 lb

## 2023-09-01 DIAGNOSIS — F1090 Alcohol use, unspecified, uncomplicated: Secondary | ICD-10-CM

## 2023-09-01 DIAGNOSIS — G44209 Tension-type headache, unspecified, not intractable: Secondary | ICD-10-CM

## 2023-09-01 DIAGNOSIS — Z8719 Personal history of other diseases of the digestive system: Secondary | ICD-10-CM

## 2023-09-01 DIAGNOSIS — K862 Cyst of pancreas: Secondary | ICD-10-CM | POA: Diagnosis not present

## 2023-09-01 MED ORDER — PROCHLORPERAZINE MALEATE 5 MG PO TABS
5.0000 mg | ORAL_TABLET | Freq: Three times a day (TID) | ORAL | 0 refills | Status: AC | PRN
  Filled 2023-09-01: qty 3, 1d supply, fill #0

## 2023-09-01 NOTE — Progress Notes (Unsigned)
  Alcohol  Month ago. Stressor: A glass of gin Vaping Managing   Headaches Temples In In-home care;  No chest pain, changes in vision, sensiry deficits, nausea Pressure   - Caffeine intake  - Water intake diary - Headache diary - Associated symptoms or food  - Ibuprofen  - has been using it 2-3 days of the week -   - Make a follow up appointment with the gastroenterolist so that they can follow up your pseudocyst   Dilaudid  and oxycodone

## 2023-09-01 NOTE — Patient Instructions (Addendum)
 Thank you, Mr.Tollie R Lederman for allowing us  to provide your care today. Today we discussed   Your alcohol use: please reach out to use if you would like to start naltrexone   Blood pressure: check your blood pressure at home. Goal of blood pressure to be <130 for the top number and <90 for the bottom one. If this continues to be high, we may need to restart your amlodipine   Please reach out to your gastroenterologist to follow up on your pancreatic pseudocyst  For your headaches, please keep track of your symptoms with  - Caffeine intake  - Water intake diary - Headache diary  I have ordered the following labs for you:  Lab Orders         CBC no Diff         Comprehensive metabolic panel with GFR         Protime-INR      I will call if any are abnormal. All of your labs can be accessed through My Chart.   My Chart Access: [email or web address]  Please follow-up in:    We look forward to seeing you next time. Please call our clinic at [phone] if you have any questions or concerns. The best time to call is Monday-Friday from 9am-4pm, but there is someone available 24/7. If after hours or the weekend, call the main hospital number and ask for the Internal Medicine Resident On-Call. If you need medication refills, please notify your pharmacy one week in advance and they will send us  a request.   Thank you for letting us  take part in your care. Wishing you the best!  Elnora Ip, MD 09/01/2023, 4:34 PM [facility]

## 2023-09-02 ENCOUNTER — Other Ambulatory Visit (HOSPITAL_COMMUNITY): Payer: Self-pay

## 2023-09-02 ENCOUNTER — Other Ambulatory Visit: Payer: Self-pay

## 2023-09-02 DIAGNOSIS — G44209 Tension-type headache, unspecified, not intractable: Secondary | ICD-10-CM | POA: Insufficient documentation

## 2023-09-02 LAB — COMPREHENSIVE METABOLIC PANEL WITH GFR
ALT: 21 IU/L (ref 0–44)
AST: 21 IU/L (ref 0–40)
Albumin: 4.5 g/dL (ref 4.1–5.1)
Alkaline Phosphatase: 97 IU/L (ref 44–121)
BUN/Creatinine Ratio: 22 — ABNORMAL HIGH (ref 9–20)
BUN: 19 mg/dL (ref 6–24)
Bilirubin Total: 0.5 mg/dL (ref 0.0–1.2)
CO2: 21 mmol/L (ref 20–29)
Calcium: 10.2 mg/dL (ref 8.7–10.2)
Chloride: 99 mmol/L (ref 96–106)
Creatinine, Ser: 0.88 mg/dL (ref 0.76–1.27)
Globulin, Total: 2.6 g/dL (ref 1.5–4.5)
Glucose: 120 mg/dL — ABNORMAL HIGH (ref 70–99)
Potassium: 4 mmol/L (ref 3.5–5.2)
Sodium: 136 mmol/L (ref 134–144)
Total Protein: 7.1 g/dL (ref 6.0–8.5)
eGFR: 108 mL/min/1.73 (ref >59)

## 2023-09-02 LAB — CBC
Hematocrit: 39.7 % (ref 37.5–51.0)
Hemoglobin: 13.6 g/dL (ref 13.0–17.7)
MCH: 31.5 pg (ref 26.6–33.0)
MCHC: 34.3 g/dL (ref 31.5–35.7)
MCV: 92 fL (ref 79–97)
Platelets: 277 x10E3/uL (ref 150–450)
RBC: 4.32 x10E6/uL (ref 4.14–5.80)
RDW: 12.8 % (ref 11.6–15.4)
WBC: 7.5 x10E3/uL (ref 3.4–10.8)

## 2023-09-02 LAB — PROTIME-INR
INR: 0.9 (ref 0.9–1.2)
Prothrombin Time: 9.9 s (ref 9.1–12.0)

## 2023-09-02 NOTE — Assessment & Plan Note (Signed)
 Patient presenting for what I suspect are tension headaches. Advise to decrease caffeine intake, increase water intake, sleep hygiene, and keep a headache diary. Advise to return to clinic if symptoms fail to improve. Also shared the judicious use of OTC pain medications as they could trigger medication over-use headaches.

## 2023-09-02 NOTE — Assessment & Plan Note (Signed)
-   Patient is to return to GI to make sure no further work up is needed - Advised to call GI for a follow up appointment and given his recent return to drinking and high risk for recurrent pancreatitis, to call us /seek medical attention if sxs recur.

## 2023-09-02 NOTE — Assessment & Plan Note (Signed)
 Labs checked during this visit. Fib score 0.74.
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
## 2023-09-02 NOTE — Assessment & Plan Note (Signed)
 Previously in remission. Patient now continues to drink 1+ glass (8oz) of gin per night. Pre-contemplative. Discussed medications and psychoterapy options for treatment. Also discussed prior alcohol-induced hepatitis and pancreatitis.  - Continued surveillance - Low fibrosis score

## 2023-09-03 ENCOUNTER — Other Ambulatory Visit (HOSPITAL_COMMUNITY): Payer: Self-pay

## 2023-09-03 ENCOUNTER — Telehealth: Payer: Self-pay | Admitting: *Deleted

## 2023-09-03 DIAGNOSIS — F1090 Alcohol use, unspecified, uncomplicated: Secondary | ICD-10-CM

## 2023-09-03 MED ORDER — NALTREXONE HCL 50 MG PO TABS
50.0000 mg | ORAL_TABLET | Freq: Every day | ORAL | 3 refills | Status: DC
  Filled 2023-09-03: qty 30, 30d supply, fill #0
  Filled 2023-09-30: qty 30, 30d supply, fill #1
  Filled 2023-10-22 – 2023-10-23 (×2): qty 30, 30d supply, fill #2
  Filled 2023-11-18: qty 30, 30d supply, fill #3

## 2023-09-03 NOTE — Telephone Encounter (Signed)
 Had a conversation with patient. Initially, he was not interested in medication for AUD but he is now amenable to it. Discussed Naltrexone  50 mg starting dose and to follow up with Telemedicine appointment to follow up with side effects and assess need for dose titration.  Hadassah Ala, MD

## 2023-09-03 NOTE — Telephone Encounter (Signed)
 RTC to patient states was told on Monday by Dr. Kristy that she would be starting him on a new med for her ETOH.  Has not received.  Note says that she was planning to do Naltrexone  at some point.  Patient would like for prescription to be sent to the [facility] on [address] if possible.   Copied from CRM [id number]. Topic: Clinical - Prescription Issue >> Sep 03, 2023  3:32 PM Diannia H wrote: Reason for CRM: Patient called and had some questions about a medication he was suppose to get on Monday but has not heard anything and wants to speak to the provider because he also has some additional questions. Could you assist? Callback number is [phone]

## 2023-09-03 NOTE — Progress Notes (Signed)
 Internal Medicine Clinic Attending  Case discussed with the resident at the time of the visit.  We reviewed the resident's history and exam and pertinent patient test results.  I agree with the assessment, diagnosis, and plan of care documented in the resident's note.

## 2023-09-05 ENCOUNTER — Other Ambulatory Visit (HOSPITAL_COMMUNITY): Payer: Self-pay

## 2023-09-05 ENCOUNTER — Other Ambulatory Visit: Payer: Self-pay | Admitting: Internal Medicine

## 2023-09-05 DIAGNOSIS — K5900 Constipation, unspecified: Secondary | ICD-10-CM

## 2023-09-05 DIAGNOSIS — J3489 Other specified disorders of nose and nasal sinuses: Secondary | ICD-10-CM

## 2023-09-05 MED ORDER — FERROUS SULFATE 325 (65 FE) MG PO TBEC
325.0000 mg | DELAYED_RELEASE_TABLET | ORAL | 3 refills | Status: AC
  Filled 2023-09-05: qty 15, 30d supply, fill #0
  Filled 2023-09-30 (×2): qty 15, 30d supply, fill #1

## 2023-09-05 MED ORDER — POLYETHYLENE GLYCOL 3350 17 G PO PACK
17.0000 g | PACK | Freq: Every day | ORAL | 0 refills | Status: AC | PRN
  Filled 2023-09-05: qty 30, 30d supply, fill #0

## 2023-09-05 MED ORDER — FLUTICASONE PROPIONATE 50 MCG/ACT NA SUSP
1.0000 | Freq: Every day | NASAL | 0 refills | Status: DC
  Filled 2023-09-05: qty 16, 30d supply, fill #0

## 2023-09-05 MED ORDER — AMLODIPINE BESYLATE 5 MG PO TABS
5.0000 mg | ORAL_TABLET | Freq: Every day | ORAL | 11 refills | Status: AC
  Filled 2023-09-05: qty 90, 90d supply, fill #0
  Filled 2023-12-09: qty 90, 90d supply, fill #1
  Filled 2024-01-30: qty 30, 30d supply, fill #2
  Filled 2024-03-05: qty 30, 30d supply, fill #3

## 2023-09-05 MED ORDER — SENNA 8.6 MG PO TABS
1.0000 | ORAL_TABLET | Freq: Every day | ORAL | 0 refills | Status: AC | PRN
  Filled 2023-09-05: qty 100, 100d supply, fill #0

## 2023-09-05 NOTE — Telephone Encounter (Signed)
 Medication sent to pharmacy

## 2023-09-08 ENCOUNTER — Other Ambulatory Visit: Payer: Self-pay

## 2023-09-08 ENCOUNTER — Other Ambulatory Visit (HOSPITAL_COMMUNITY): Payer: Self-pay

## 2023-09-08 MED ORDER — DESCOVY 200-25 MG PO TABS
1.0000 | ORAL_TABLET | Freq: Every day | ORAL | 0 refills | Status: DC
  Filled 2023-09-08 – 2023-10-15 (×2): qty 90, 90d supply, fill #0

## 2023-09-09 ENCOUNTER — Other Ambulatory Visit: Payer: Self-pay

## 2023-09-10 ENCOUNTER — Other Ambulatory Visit: Payer: Self-pay

## 2023-09-10 NOTE — Progress Notes (Signed)
 Refill too soon. Insurance will pay again 9/13

## 2023-09-18 ENCOUNTER — Other Ambulatory Visit (HOSPITAL_COMMUNITY): Payer: Self-pay

## 2023-09-30 ENCOUNTER — Other Ambulatory Visit (HOSPITAL_COMMUNITY): Payer: Self-pay

## 2023-09-30 ENCOUNTER — Other Ambulatory Visit: Payer: Self-pay

## 2023-09-30 ENCOUNTER — Encounter: Admitting: Student

## 2023-10-01 ENCOUNTER — Other Ambulatory Visit (HOSPITAL_COMMUNITY): Payer: Self-pay

## 2023-10-07 ENCOUNTER — Other Ambulatory Visit: Payer: Self-pay

## 2023-10-14 ENCOUNTER — Other Ambulatory Visit: Payer: Self-pay

## 2023-10-14 NOTE — Progress Notes (Signed)
 Pharmacy Patient Advocate Encounter  Insurance verification completed.   The patient is insured through [facility] MEDICAID   Ran test claim for Descovy . Co-pay is $0.  This test claim was processed through [facility]- copay amounts may vary at other pharmacies due to pharmacy/plan contracts, or as the patient moves through the different stages of their insurance plan.

## 2023-10-15 ENCOUNTER — Other Ambulatory Visit: Payer: Self-pay

## 2023-10-15 ENCOUNTER — Other Ambulatory Visit (HOSPITAL_COMMUNITY): Payer: Self-pay

## 2023-10-15 MED ORDER — DOXYCYCLINE HYCLATE 100 MG PO TABS
200.0000 mg | ORAL_TABLET | ORAL | 2 refills | Status: AC
  Filled 2023-10-15: qty 28, 14d supply, fill #0
  Filled 2023-11-18: qty 28, 14d supply, fill #1
  Filled 2024-01-30: qty 28, 14d supply, fill #2

## 2023-10-15 NOTE — Progress Notes (Signed)
 Specialty Pharmacy Initial Fill Coordination Note  Alexander [name] is a 43 y.o. male contacted today regarding initial fill of specialty medication(s) Emtricitabine -Tenofovir  AF (Descovy )   Patient requested Delivery   Delivery date: 10/17/23   Verified address: [address]   Medication will be filled on 9/18.   Patient is aware of $0 copayment.

## 2023-10-15 NOTE — Progress Notes (Signed)
 Specialty Pharmacy Initiation Note   Alexander [name] is a 43 y.o. male who will be followed by the specialty pharmacy service for RxSp HIV PrEP    Review of administration, indication, effectiveness, safety, potential side effects, storage/disposable, and missed dose instructions occurred today for patient's specialty medication(s) Emtricitabine -Tenofovir  AF (Descovy )     Patient/Caregiver did not have any additional questions or concerns.   Patient's therapy is appropriate to: Continue (Patient already established on treatment for years, transferring pharmacies)    Goals Addressed             This Visit's Progress    Maintain optimal adherence to therapy       Patient is on track. Patient will maintain adherence and adhere to provider and/or lab appointments. Patient already established on treatment for years, transferring pharmacies          Delon CHRISTELLA Brow Specialty Pharmacist

## 2023-10-16 ENCOUNTER — Other Ambulatory Visit: Payer: Self-pay

## 2023-10-22 ENCOUNTER — Other Ambulatory Visit (HOSPITAL_COMMUNITY): Payer: Self-pay

## 2023-10-22 ENCOUNTER — Other Ambulatory Visit: Payer: Self-pay | Admitting: Student

## 2023-10-22 ENCOUNTER — Other Ambulatory Visit: Payer: Self-pay | Admitting: Internal Medicine

## 2023-10-22 DIAGNOSIS — G629 Polyneuropathy, unspecified: Secondary | ICD-10-CM

## 2023-10-22 DIAGNOSIS — N529 Male erectile dysfunction, unspecified: Secondary | ICD-10-CM

## 2023-10-22 DIAGNOSIS — R1013 Epigastric pain: Secondary | ICD-10-CM

## 2023-10-22 DIAGNOSIS — F419 Anxiety disorder, unspecified: Secondary | ICD-10-CM

## 2023-10-22 DIAGNOSIS — J3489 Other specified disorders of nose and nasal sinuses: Secondary | ICD-10-CM

## 2023-10-22 MED ORDER — DULOXETINE HCL 60 MG PO CPEP
60.0000 mg | ORAL_CAPSULE | Freq: Every day | ORAL | 2 refills | Status: DC
  Filled 2023-10-22 – 2023-11-18 (×3): qty 30, 30d supply, fill #0
  Filled 2023-12-16: qty 30, 30d supply, fill #1
  Filled 2024-01-15 (×2): qty 30, 30d supply, fill #2

## 2023-10-22 MED ORDER — GABAPENTIN 300 MG PO CAPS
600.0000 mg | ORAL_CAPSULE | Freq: Three times a day (TID) | ORAL | 3 refills | Status: AC
  Filled 2023-10-22 – 2023-11-18 (×2): qty 270, 45d supply, fill #0
  Filled 2024-01-09: qty 270, 45d supply, fill #1
  Filled 2024-01-30: qty 180, 30d supply, fill #2
  Filled 2024-03-05: qty 180, 30d supply, fill #3

## 2023-10-22 MED ORDER — TADALAFIL 20 MG PO TABS
20.0000 mg | ORAL_TABLET | Freq: Every day | ORAL | 2 refills | Status: DC | PRN
  Filled 2023-10-22 – 2023-10-27 (×2): qty 30, 30d supply, fill #0
  Filled 2023-11-27: qty 30, 30d supply, fill #1
  Filled 2023-12-22: qty 30, 30d supply, fill #2

## 2023-10-22 MED ORDER — FLUTICASONE PROPIONATE 50 MCG/ACT NA SUSP
1.0000 | Freq: Every day | NASAL | 0 refills | Status: DC
  Filled 2023-10-22: qty 16, 34d supply, fill #0

## 2023-10-22 MED ORDER — PANTOPRAZOLE SODIUM 40 MG PO TBEC
40.0000 mg | DELAYED_RELEASE_TABLET | Freq: Every day | ORAL | 3 refills | Status: AC
  Filled 2023-10-22 – 2023-11-18 (×2): qty 90, 90d supply, fill #0
  Filled 2024-01-30: qty 30, 30d supply, fill #1
  Filled 2024-03-05: qty 30, 30d supply, fill #2

## 2023-10-23 ENCOUNTER — Other Ambulatory Visit: Payer: Self-pay

## 2023-10-27 ENCOUNTER — Encounter (HOSPITAL_COMMUNITY): Payer: Self-pay

## 2023-10-27 ENCOUNTER — Other Ambulatory Visit (HOSPITAL_COMMUNITY): Payer: Self-pay

## 2023-11-06 ENCOUNTER — Other Ambulatory Visit (HOSPITAL_COMMUNITY): Payer: Self-pay

## 2023-11-18 ENCOUNTER — Other Ambulatory Visit: Payer: Self-pay

## 2023-11-18 ENCOUNTER — Other Ambulatory Visit (HOSPITAL_COMMUNITY): Payer: Self-pay

## 2023-11-18 ENCOUNTER — Other Ambulatory Visit: Payer: Self-pay | Admitting: Student

## 2023-11-18 ENCOUNTER — Other Ambulatory Visit: Payer: Self-pay | Admitting: Internal Medicine

## 2023-11-18 DIAGNOSIS — F419 Anxiety disorder, unspecified: Secondary | ICD-10-CM

## 2023-11-19 ENCOUNTER — Other Ambulatory Visit: Payer: Self-pay

## 2023-11-19 ENCOUNTER — Other Ambulatory Visit (HOSPITAL_COMMUNITY): Payer: Self-pay

## 2023-11-19 MED ORDER — DOXYCYCLINE HYCLATE 100 MG PO TABS
200.0000 mg | ORAL_TABLET | Freq: Every day | ORAL | 2 refills | Status: AC
  Filled 2023-11-19 – 2023-12-11 (×2): qty 28, 14d supply, fill #0
  Filled 2024-01-09: qty 28, 14d supply, fill #1
  Filled 2024-02-19: qty 28, 14d supply, fill #2

## 2023-11-19 MED ORDER — DESCOVY 200-25 MG PO TABS
1.0000 | ORAL_TABLET | Freq: Every day | ORAL | 0 refills | Status: AC
  Filled 2023-11-19 – 2024-01-09 (×3): qty 90, 90d supply, fill #0

## 2023-11-19 MED ORDER — HYDROXYZINE HCL 25 MG PO TABS
25.0000 mg | ORAL_TABLET | Freq: Three times a day (TID) | ORAL | 2 refills | Status: DC | PRN
  Filled 2023-11-19: qty 90, 30d supply, fill #0
  Filled 2024-01-12: qty 90, 30d supply, fill #1
  Filled 2024-01-30: qty 90, 30d supply, fill #2

## 2023-11-19 MED ORDER — PANCRELIPASE (LIP-PROT-AMYL) 36000-114000 UNITS PO CPEP
36000.0000 [IU] | ORAL_CAPSULE | Freq: Three times a day (TID) | ORAL | 0 refills | Status: DC
  Filled 2023-11-19: qty 100, 34d supply, fill #0

## 2023-11-26 ENCOUNTER — Telehealth: Payer: Self-pay | Admitting: *Deleted

## 2023-11-26 NOTE — Telephone Encounter (Signed)
 Copied from CRM #[id number]. Topic: Clinical - Medical Advice >> Nov 26, 2023 12:52 PM Chiquita SQUIBB wrote: Reason for CRM: Patient is calling in requesting to come in today for a Syphilis shot. He recently had testing done at a clinic and his numbers were high. He got put on a medication but the medication is not agreeing with him so the clinic recommended a shot but said he would have to contact his pcp for the shot. No symptoms, just the high test result numbers. Please advise patient if the shot can be done in the office.

## 2023-11-26 NOTE — Telephone Encounter (Signed)
 Called pt to let him know he will need an appt with the doctor; no answer. Mailbox is full, unable to leave a message. I will send a My Chart message.

## 2023-11-27 ENCOUNTER — Ambulatory Visit

## 2023-11-27 ENCOUNTER — Other Ambulatory Visit: Payer: Self-pay

## 2023-11-27 ENCOUNTER — Other Ambulatory Visit (HOSPITAL_COMMUNITY): Payer: Self-pay

## 2023-11-27 VITALS — BP 143/87 | HR 69 | Temp 98.0°F | Ht 67.0 in | Wt 168.4 lb

## 2023-11-27 DIAGNOSIS — R197 Diarrhea, unspecified: Secondary | ICD-10-CM

## 2023-11-27 DIAGNOSIS — A539 Syphilis, unspecified: Secondary | ICD-10-CM | POA: Insufficient documentation

## 2023-11-27 MED ORDER — PENICILLIN G BENZATHINE 1200000 UNIT/2ML IM SUSY
2.4000 10*6.[IU] | PREFILLED_SYRINGE | Freq: Once | INTRAMUSCULAR | Status: AC
  Administered 2023-11-27: 2.4 10*6.[IU] via INTRAMUSCULAR

## 2023-11-27 NOTE — Telephone Encounter (Signed)
Called pt again - no answer; mailbox full, unable to leave a message.

## 2023-11-27 NOTE — Progress Notes (Signed)
 Established Patient Office Visit  Subjective   Patient ID: Alexander [name], male    DOB: 1980-11-29  Age: 43 y.o. MRN: [id number]  Chief Complaint  Patient presents with   Follow-up    Follow up for ? Positive results -waiting report     Alexander [name] is a 43 year old male with a past medical history of hypertension, AUD, and anemia who presents to clinic today to receive a pencillin G shot for his newly diagnosed syphillis infection. Please see problem-based assessment and plan below for details.     Review of Systems  Constitutional:  Positive for malaise/fatigue. Negative for chills, fever and weight loss.  HENT: Negative.    Respiratory: Negative.    Cardiovascular: Negative.   Gastrointestinal:  Positive for diarrhea.       Improving diarrhea, spotty bright red blood in the stool  Genitourinary: Negative.        Proctalgia fulgax  Musculoskeletal: Negative.   Skin: Negative.   Neurological: Negative.   Psychiatric/Behavioral: Negative.        Objective:    BP (!) 143/87 (BP Location: Left Arm, Patient Position: Sitting, Cuff Size: Normal)   Pulse 69   Temp 98 F (36.7 C) (Oral)   Ht 5' 7 (1.702 m)   Wt 168 lb 6.4 oz (76.4 kg)   SpO2 97%   BMI 26.38 kg/m  Physical Exam Constitutional:      Appearance: Normal appearance.  HENT:     Mouth/Throat:     Mouth: Mucous membranes are moist.  Eyes:     Extraocular Movements: Extraocular movements intact.     Pupils: Pupils are equal, round, and reactive to light.  Cardiovascular:     Rate and Rhythm: Normal rate and regular rhythm.     Pulses: Normal pulses.     Heart sounds: Normal heart sounds.  Pulmonary:     Effort: Pulmonary effort is normal.     Breath sounds: Normal breath sounds.  Abdominal:     General: Abdomen is flat.     Palpations: Abdomen is soft.  Musculoskeletal:        General: Normal range of motion.  Skin:    General: Skin is warm and dry.  Neurological:     General: No focal deficit  present.     Mental Status: He is alert and oriented to person, place, and time.  Psychiatric:        Mood and Affect: Mood normal.        Behavior: Behavior normal.     The ASCVD Risk score (Arnett DK, et al., 2019) failed to calculate for the following reasons:   The valid HDL cholesterol range is 20 to 100 mg/dL    Assessment & Plan:   Patient seen with Dr. Jeanelle.  Problem List Items Addressed This Visit       Other   Syphilis - Primary   Patient presented to the office today for a penicillin G shot for newly diagnosed syphilis. The patient routinely goes to the [facility] Department for STD/STI screening, and was recently found to be RPR positive.  He states that his titers for syphilis typically value around 3, however was found to be 9 at the most recent blood draw.  He was negative at that time for HIV and G/C. He states he was given what he believes to be doxycycline  100mg  twice daily PO for 14 days as opposed to a pencillin shot, as the health department is under  restriction to give penicillin G to pregnant women only at this time.  Unable to get full fax report from the health department due to their fax machine not working. He states he had picked up the antibiotic on Monday of this week and taken 1-2 days of the pill. He then developed nausea, slight stomach pains, and profuse diarrhea. Denies vomiting, fevers, chills, or any sores, but does endorse a sensation of proctalgia fugax with wrong movement/prolonged sitting/bowel movements and slight spotty bright red blood with defecation.   He feels internally off but states the pain in his rectum has not gotten worse. His diarrhea has improved but is still having an occasional loose stool. Has not had a sexual encounter in the past 2 weeks. Discussed with patient that his symptoms of proctalgia fugax and tenesmus may be secondary to the recent diarrhea he had from what is likely a side effect from the antibiotic vs  acute gastritis. On DRE, noted to have external hemorrhoid, which also could be contributing to his symptoms. Discussed symptomatic care, such as sitz baths, increasing fiber intake with Miralax , and NSAIDs to decrease inflammation.   - Will give Penicillin G 2.4mil IM injection today. Informed patient to return to health department to follow up on his titers and to return to clinic for further PenG injections if his titers do not improve.         Alexander Stiehl, DO Internal Medicine Resident, PGY-1 5:03 PM 11/27/2023

## 2023-11-27 NOTE — Patient Instructions (Signed)
 Thank you, Alexander [name] for allowing us  to provide your care today. Today we discussed your need for a pencillin shot.    Please return to the health department to continue checking your titers. If they remain elevated and you need an additional shot, we are happy to give you another. We can schedule a nurse visit for you and get another on your schedule to treat it effectively.     We look forward to seeing you next time. Please call our clinic at [phone] if you have any questions or concerns. The best time to call is Monday-Friday from 9am-4pm, but there is someone available 24/7. If after hours or the weekend, call the main hospital number and ask for the Internal Medicine Resident On-Call. If you need medication refills, please notify your pharmacy one week in advance and they will send us  a request.   Thank you for letting us  take part in your care. Wishing you the best!  Walter Min, DO 11/27/2023, 4:41 PM [facility]

## 2023-11-27 NOTE — Assessment & Plan Note (Addendum)
 Patient presented to the office today for a penicillin G shot for newly diagnosed syphilis. The patient routinely goes to the [facility] Department for STD/STI screening, and was recently found to be RPR positive.  He states that his titers for syphilis typically value around 3, however was found to be 9 at the most recent blood draw.  He was negative at that time for HIV and G/C. He states he was given what he believes to be doxycycline  100mg  twice daily PO for 14 days as opposed to a pencillin shot, as the health department is under restriction to give penicillin G to pregnant women only at this time.  Unable to get full fax report from the health department due to their fax machine not working. He states he had picked up the antibiotic on Monday of this week and taken 1-2 days of the pill. He then developed nausea, slight stomach pains, and profuse diarrhea. Denies vomiting, fevers, chills, or any sores, but does endorse a sensation of proctalgia fugax with wrong movement/prolonged sitting/bowel movements and slight spotty bright red blood with defecation.   He feels internally off but states the pain in his rectum has not gotten worse. His diarrhea has improved but is still having an occasional loose stool. Has not had a sexual encounter in the past 2 weeks. Discussed with patient that his symptoms of proctalgia fugax and tenesmus may be secondary to the recent diarrhea he had from what is likely a side effect from the antibiotic vs acute gastritis. On DRE, noted to have external hemorrhoid, which also could be contributing to his symptoms. Discussed symptomatic care, such as sitz baths, increasing fiber intake with Miralax , and NSAIDs to decrease inflammation.   - Will give Penicillin G 2.4mil IM injection today. Informed patient to return to health department to follow up on his titers and to return to clinic for further PenG injections if his titers do not improve.

## 2023-11-27 NOTE — Telephone Encounter (Addendum)
 Patient called he is requesting a appointment to be seen asap. Patient tested positive for Syphilis at the [facility] Department. Patient is requesting to get a penicillin shot. Patient stated he does not feel right, his rectum feels sore when having a bowel movement. Patient stated he tested negative for HIV,Gonorrhea,Chlamydia and RPR. Patient does not have the lab result with him I called the health department to get them to fax over the lab result I was told someone will be returning my call.  Nanetta returned my call she gave me their fax number she is requesting a release of information form to be completed and faxed to the office.

## 2023-12-05 ENCOUNTER — Other Ambulatory Visit (HOSPITAL_COMMUNITY): Payer: Self-pay

## 2023-12-09 ENCOUNTER — Other Ambulatory Visit (HOSPITAL_COMMUNITY): Payer: Self-pay

## 2023-12-11 ENCOUNTER — Other Ambulatory Visit (HOSPITAL_COMMUNITY): Payer: Self-pay

## 2023-12-16 ENCOUNTER — Other Ambulatory Visit (HOSPITAL_COMMUNITY): Payer: Self-pay

## 2023-12-22 ENCOUNTER — Other Ambulatory Visit (HOSPITAL_COMMUNITY): Payer: Self-pay

## 2024-01-06 ENCOUNTER — Other Ambulatory Visit (HOSPITAL_COMMUNITY): Payer: Self-pay

## 2024-01-08 ENCOUNTER — Other Ambulatory Visit: Payer: Self-pay

## 2024-01-09 ENCOUNTER — Other Ambulatory Visit: Payer: Self-pay

## 2024-01-09 ENCOUNTER — Other Ambulatory Visit (HOSPITAL_COMMUNITY): Payer: Self-pay

## 2024-01-09 ENCOUNTER — Other Ambulatory Visit: Payer: Self-pay | Admitting: Student

## 2024-01-09 DIAGNOSIS — F1090 Alcohol use, unspecified, uncomplicated: Secondary | ICD-10-CM

## 2024-01-09 MED ORDER — NALTREXONE HCL 50 MG PO TABS
50.0000 mg | ORAL_TABLET | Freq: Every day | ORAL | 3 refills | Status: AC
  Filled 2024-01-09: qty 30, 30d supply, fill #0
  Filled 2024-01-30: qty 30, 30d supply, fill #1
  Filled 2024-03-05: qty 30, 30d supply, fill #2

## 2024-01-09 MED ORDER — PANCRELIPASE (LIP-PROT-AMYL) 36000-114000 UNITS PO CPEP
36000.0000 [IU] | ORAL_CAPSULE | Freq: Three times a day (TID) | ORAL | 0 refills | Status: DC
  Filled 2024-01-09: qty 100, 34d supply, fill #0

## 2024-01-12 ENCOUNTER — Other Ambulatory Visit (HOSPITAL_COMMUNITY): Payer: Self-pay

## 2024-01-13 ENCOUNTER — Other Ambulatory Visit: Payer: Self-pay | Admitting: Pharmacy Technician

## 2024-01-13 ENCOUNTER — Other Ambulatory Visit: Payer: Self-pay

## 2024-01-13 NOTE — Progress Notes (Signed)
 Specialty Pharmacy Refill Coordination Note  Alexander [name] is a 43 y.o. male contacted today regarding refills of specialty medication(s) Emtricitabine -Tenofovir  AF (Descovy )   Patient requested Delivery   Delivery date: 01/16/24   Verified address: [address]  [place]   Medication will be filled on: 01/15/24

## 2024-01-15 ENCOUNTER — Other Ambulatory Visit (HOSPITAL_COMMUNITY): Payer: Self-pay

## 2024-01-15 ENCOUNTER — Other Ambulatory Visit: Payer: Self-pay | Admitting: Student

## 2024-01-15 ENCOUNTER — Other Ambulatory Visit: Payer: Self-pay

## 2024-01-15 DIAGNOSIS — N529 Male erectile dysfunction, unspecified: Secondary | ICD-10-CM

## 2024-01-15 MED ORDER — TADALAFIL 20 MG PO TABS
20.0000 mg | ORAL_TABLET | Freq: Every day | ORAL | 2 refills | Status: AC | PRN
  Filled 2024-01-15: qty 30, 30d supply, fill #0
  Filled 2024-01-30: qty 30, 30d supply, fill #1
  Filled 2024-03-05: qty 30, 30d supply, fill #2

## 2024-01-16 ENCOUNTER — Other Ambulatory Visit (HOSPITAL_BASED_OUTPATIENT_CLINIC_OR_DEPARTMENT_OTHER): Payer: Self-pay

## 2024-01-30 ENCOUNTER — Other Ambulatory Visit: Payer: Self-pay

## 2024-01-30 ENCOUNTER — Other Ambulatory Visit: Payer: Self-pay | Admitting: Student

## 2024-01-30 ENCOUNTER — Other Ambulatory Visit (HOSPITAL_COMMUNITY): Payer: Self-pay

## 2024-01-30 ENCOUNTER — Telehealth (HOSPITAL_COMMUNITY): Payer: Self-pay

## 2024-01-30 DIAGNOSIS — J3489 Other specified disorders of nose and nasal sinuses: Secondary | ICD-10-CM

## 2024-01-30 DIAGNOSIS — F419 Anxiety disorder, unspecified: Secondary | ICD-10-CM

## 2024-01-30 DIAGNOSIS — G629 Polyneuropathy, unspecified: Secondary | ICD-10-CM

## 2024-02-02 ENCOUNTER — Other Ambulatory Visit (HOSPITAL_COMMUNITY): Payer: Self-pay

## 2024-02-02 MED ORDER — DULOXETINE HCL 60 MG PO CPEP
60.0000 mg | ORAL_CAPSULE | Freq: Every day | ORAL | 2 refills | Status: AC
  Filled 2024-02-02 – 2024-02-19 (×3): qty 30, 30d supply, fill #0

## 2024-02-02 MED ORDER — FLUTICASONE PROPIONATE 50 MCG/ACT NA SUSP
1.0000 | Freq: Every day | NASAL | 0 refills | Status: DC
  Filled 2024-02-02: qty 16, 34d supply, fill #0

## 2024-02-02 MED ORDER — PANCRELIPASE (LIP-PROT-AMYL) 36000-114000 UNITS PO CPEP
36000.0000 [IU] | ORAL_CAPSULE | Freq: Three times a day (TID) | ORAL | 0 refills | Status: AC
  Filled 2024-02-02 – 2024-02-19 (×3): qty 100, 34d supply, fill #0

## 2024-02-03 ENCOUNTER — Other Ambulatory Visit (HOSPITAL_COMMUNITY): Payer: Self-pay

## 2024-02-04 ENCOUNTER — Other Ambulatory Visit (HOSPITAL_COMMUNITY): Payer: Self-pay

## 2024-02-06 ENCOUNTER — Other Ambulatory Visit (HOSPITAL_COMMUNITY): Payer: Self-pay

## 2024-02-09 ENCOUNTER — Other Ambulatory Visit (HOSPITAL_COMMUNITY): Payer: Self-pay

## 2024-02-09 ENCOUNTER — Other Ambulatory Visit: Payer: Self-pay

## 2024-02-16 ENCOUNTER — Other Ambulatory Visit (HOSPITAL_COMMUNITY): Payer: Self-pay

## 2024-02-19 ENCOUNTER — Other Ambulatory Visit (HOSPITAL_COMMUNITY): Payer: Self-pay

## 2024-03-05 ENCOUNTER — Other Ambulatory Visit: Payer: Self-pay

## 2024-03-05 ENCOUNTER — Other Ambulatory Visit (HOSPITAL_COMMUNITY): Payer: Self-pay

## 2024-03-05 DIAGNOSIS — F419 Anxiety disorder, unspecified: Secondary | ICD-10-CM

## 2024-03-05 DIAGNOSIS — J3489 Other specified disorders of nose and nasal sinuses: Secondary | ICD-10-CM

## 2024-03-05 MED ORDER — FLUTICASONE PROPIONATE 50 MCG/ACT NA SUSP
1.0000 | Freq: Every day | NASAL | 0 refills | Status: AC
  Filled 2024-03-05: qty 16, 34d supply, fill #0

## 2024-03-05 MED ORDER — HYDROXYZINE HCL 25 MG PO TABS
25.0000 mg | ORAL_TABLET | Freq: Three times a day (TID) | ORAL | 2 refills | Status: AC | PRN
  Filled 2024-03-05: qty 90, 30d supply, fill #0
